# Patient Record
Sex: Female | Born: 1955 | Race: White | Hispanic: No | Marital: Married | Smoking: Former smoker
Health system: Southern US, Community
[De-identification: ages and names within clinical notes are randomized; demographics above are authoritative.]

## PROBLEM LIST (undated history)

## (undated) DIAGNOSIS — R232 Flushing: Secondary | ICD-10-CM

## (undated) DIAGNOSIS — D7289 Other specified disorders of white blood cells: Secondary | ICD-10-CM

## (undated) DIAGNOSIS — I10 Essential (primary) hypertension: Secondary | ICD-10-CM

## (undated) DIAGNOSIS — J309 Allergic rhinitis, unspecified: Secondary | ICD-10-CM

## (undated) HISTORY — DX: Flushing: R23.2

## (undated) HISTORY — DX: Allergic rhinitis, unspecified: J30.9

## (undated) HISTORY — PX: CARPAL TUNNEL RELEASE: SHX101

## (undated) HISTORY — DX: Other specified disorders of white blood cells: D72.89

## (undated) HISTORY — DX: Essential (primary) hypertension: I10

---

## 1997-11-29 ENCOUNTER — Other Ambulatory Visit: Admission: RE | Admit: 1997-11-29 | Discharge: 1997-11-29 | Payer: Self-pay | Admitting: Obstetrics and Gynecology

## 1999-01-08 ENCOUNTER — Other Ambulatory Visit: Admission: RE | Admit: 1999-01-08 | Discharge: 1999-01-08 | Payer: Self-pay | Admitting: Obstetrics and Gynecology

## 1999-01-22 ENCOUNTER — Encounter: Admission: RE | Admit: 1999-01-22 | Discharge: 1999-01-22 | Payer: Self-pay | Admitting: Obstetrics and Gynecology

## 1999-01-22 ENCOUNTER — Encounter: Payer: Self-pay | Admitting: Obstetrics and Gynecology

## 1999-02-04 ENCOUNTER — Encounter: Admission: RE | Admit: 1999-02-04 | Discharge: 1999-03-13 | Payer: Self-pay | Admitting: Internal Medicine

## 1999-09-24 ENCOUNTER — Ambulatory Visit (HOSPITAL_COMMUNITY): Admission: RE | Admit: 1999-09-24 | Discharge: 1999-09-24 | Payer: Self-pay | Admitting: Obstetrics and Gynecology

## 1999-09-24 ENCOUNTER — Encounter (INDEPENDENT_AMBULATORY_CARE_PROVIDER_SITE_OTHER): Payer: Self-pay

## 2000-01-21 ENCOUNTER — Other Ambulatory Visit: Admission: RE | Admit: 2000-01-21 | Discharge: 2000-01-21 | Payer: Self-pay | Admitting: Obstetrics and Gynecology

## 2000-01-26 ENCOUNTER — Encounter: Admission: RE | Admit: 2000-01-26 | Discharge: 2000-01-26 | Payer: Self-pay | Admitting: Obstetrics and Gynecology

## 2000-01-26 ENCOUNTER — Encounter: Payer: Self-pay | Admitting: Obstetrics and Gynecology

## 2001-02-15 ENCOUNTER — Encounter: Admission: RE | Admit: 2001-02-15 | Discharge: 2001-02-15 | Payer: Self-pay | Admitting: Obstetrics and Gynecology

## 2001-02-15 ENCOUNTER — Encounter: Payer: Self-pay | Admitting: Obstetrics and Gynecology

## 2001-03-09 ENCOUNTER — Other Ambulatory Visit: Admission: RE | Admit: 2001-03-09 | Discharge: 2001-03-09 | Payer: Self-pay | Admitting: Obstetrics and Gynecology

## 2002-03-15 ENCOUNTER — Encounter: Payer: Self-pay | Admitting: Obstetrics and Gynecology

## 2002-03-15 ENCOUNTER — Encounter: Admission: RE | Admit: 2002-03-15 | Discharge: 2002-03-15 | Payer: Self-pay | Admitting: Obstetrics and Gynecology

## 2002-04-10 ENCOUNTER — Other Ambulatory Visit: Admission: RE | Admit: 2002-04-10 | Discharge: 2002-04-10 | Payer: Self-pay | Admitting: Obstetrics and Gynecology

## 2003-04-24 ENCOUNTER — Encounter: Admission: RE | Admit: 2003-04-24 | Discharge: 2003-04-24 | Payer: Self-pay | Admitting: Obstetrics and Gynecology

## 2003-06-25 ENCOUNTER — Other Ambulatory Visit: Admission: RE | Admit: 2003-06-25 | Discharge: 2003-06-25 | Payer: Self-pay | Admitting: Obstetrics and Gynecology

## 2004-08-21 ENCOUNTER — Other Ambulatory Visit: Admission: RE | Admit: 2004-08-21 | Discharge: 2004-08-21 | Payer: Self-pay | Admitting: Obstetrics and Gynecology

## 2004-10-14 ENCOUNTER — Ambulatory Visit: Payer: Self-pay | Admitting: Gastroenterology

## 2005-10-16 ENCOUNTER — Ambulatory Visit: Payer: Self-pay | Admitting: Internal Medicine

## 2005-11-13 ENCOUNTER — Ambulatory Visit: Payer: Self-pay | Admitting: Internal Medicine

## 2005-11-13 DIAGNOSIS — I10 Essential (primary) hypertension: Secondary | ICD-10-CM

## 2006-02-22 ENCOUNTER — Ambulatory Visit: Payer: Self-pay | Admitting: Internal Medicine

## 2006-09-23 ENCOUNTER — Encounter: Admission: RE | Admit: 2006-09-23 | Discharge: 2006-09-23 | Payer: Self-pay | Admitting: Obstetrics and Gynecology

## 2006-09-29 ENCOUNTER — Ambulatory Visit: Payer: Self-pay | Admitting: Internal Medicine

## 2006-09-29 LAB — CONVERTED CEMR LAB
Bilirubin Urine: NEGATIVE
Blood in Urine, dipstick: NEGATIVE
Glucose, Urine, Semiquant: NEGATIVE
Ketones, urine, test strip: NEGATIVE
Nitrite: NEGATIVE
Protein, U semiquant: NEGATIVE
Specific Gravity, Urine: <1.005
Urobilinogen, UA: 0.2
pH: 5.5

## 2006-10-12 ENCOUNTER — Telehealth: Payer: Self-pay | Admitting: Internal Medicine

## 2006-10-13 ENCOUNTER — Ambulatory Visit: Payer: Self-pay | Admitting: Internal Medicine

## 2006-10-13 LAB — CONVERTED CEMR LAB
Blood in Urine, dipstick: NEGATIVE
Glucose, Urine, Semiquant: NEGATIVE
Nitrite: POSITIVE
Specific Gravity, Urine: 1.02
Urobilinogen, UA: 0.2
WBC Urine, dipstick: NEGATIVE
pH: 5

## 2007-03-10 ENCOUNTER — Ambulatory Visit: Payer: Self-pay | Admitting: Internal Medicine

## 2007-03-10 ENCOUNTER — Telehealth: Payer: Self-pay | Admitting: *Deleted

## 2007-04-13 ENCOUNTER — Encounter (INDEPENDENT_AMBULATORY_CARE_PROVIDER_SITE_OTHER): Payer: Self-pay | Admitting: Obstetrics and Gynecology

## 2007-04-13 ENCOUNTER — Ambulatory Visit (HOSPITAL_COMMUNITY): Admission: RE | Admit: 2007-04-13 | Discharge: 2007-04-13 | Payer: Self-pay | Admitting: Obstetrics and Gynecology

## 2007-04-28 ENCOUNTER — Ambulatory Visit: Payer: Self-pay | Admitting: Internal Medicine

## 2007-04-29 ENCOUNTER — Telehealth: Payer: Self-pay | Admitting: Internal Medicine

## 2007-05-03 ENCOUNTER — Telehealth: Payer: Self-pay | Admitting: Internal Medicine

## 2007-05-04 ENCOUNTER — Ambulatory Visit: Payer: Self-pay | Admitting: Internal Medicine

## 2007-05-04 DIAGNOSIS — D7289 Other specified disorders of white blood cells: Secondary | ICD-10-CM

## 2007-05-04 HISTORY — DX: Other specified disorders of white blood cells: D72.89

## 2007-05-04 LAB — CONVERTED CEMR LAB
ALT: 110 units/L — ABNORMAL HIGH (ref 0–35)
Alkaline Phosphatase: 134 units/L — ABNORMAL HIGH (ref 39–117)
Bilirubin, Direct: 0.1 mg/dL (ref 0.0–0.3)
CO2: 31 meq/L (ref 19–32)
Glucose, Bld: 88 mg/dL (ref 70–99)
Hemoglobin: 12.2 g/dL (ref 12.0–15.0)
Lymphocytes Relative: 58.4 % — ABNORMAL HIGH (ref 12.0–46.0)
Monocytes Relative: 12.4 % — ABNORMAL HIGH (ref 3.0–12.0)
Platelets: 154 10*3/uL (ref 150–400)
Potassium: 4.2 meq/L (ref 3.5–5.1)
RDW: 13.4 % (ref 11.5–14.6)
Sodium: 141 meq/L (ref 135–145)
Total Protein: 7 g/dL (ref 6.0–8.3)

## 2007-05-06 LAB — CONVERTED CEMR LAB
Basophils Absolute: 0 10*3/uL (ref 0.0–0.1)
Lymphocytes Relative: 59.1 % — ABNORMAL HIGH (ref 12.0–46.0)
MCHC: 33.7 g/dL (ref 30.0–36.0)
Neutro Abs: 1.6 10*3/uL (ref 1.4–7.7)
Neutrophils Relative %: 28.9 % — ABNORMAL LOW (ref 43.0–77.0)
RDW: 13.3 % (ref 11.5–14.6)

## 2007-05-09 ENCOUNTER — Encounter: Payer: Self-pay | Admitting: Internal Medicine

## 2007-05-10 ENCOUNTER — Telehealth: Payer: Self-pay | Admitting: Internal Medicine

## 2007-05-12 LAB — CONVERTED CEMR LAB
Ferritin: 136 ng/mL
HCT: 37.1 %
Hemoglobin: 12.4 g/dL
Iron: 81 ug/dL
LDH: 248 U/L
MCHC: 33.4 g/dL
MCV: 84.5 fL
Platelets: 185 10*3/uL
RBC: 4.39 M/uL
RDW: 15.1 %
Saturation Ratios: 25 %
TIBC: 321 ug/dL
UIBC: 240 ug/dL
Vitamin B-12: 430 pg/mL
WBC: 7.3 10*3/uL

## 2007-05-16 ENCOUNTER — Telehealth: Payer: Self-pay | Admitting: Internal Medicine

## 2007-05-16 ENCOUNTER — Ambulatory Visit: Payer: Self-pay | Admitting: Hematology and Oncology

## 2007-05-26 ENCOUNTER — Other Ambulatory Visit: Admission: RE | Admit: 2007-05-26 | Discharge: 2007-05-26 | Payer: Self-pay | Admitting: Hematology and Oncology

## 2007-05-26 ENCOUNTER — Telehealth: Payer: Self-pay | Admitting: Internal Medicine

## 2007-05-26 ENCOUNTER — Encounter: Payer: Self-pay | Admitting: Internal Medicine

## 2007-05-26 ENCOUNTER — Encounter: Payer: Self-pay | Admitting: Hematology and Oncology

## 2007-07-11 ENCOUNTER — Ambulatory Visit: Payer: Self-pay | Admitting: Internal Medicine

## 2007-07-12 ENCOUNTER — Encounter: Payer: Self-pay | Admitting: Internal Medicine

## 2007-08-23 ENCOUNTER — Telehealth: Payer: Self-pay | Admitting: Internal Medicine

## 2007-09-26 ENCOUNTER — Encounter: Admission: RE | Admit: 2007-09-26 | Discharge: 2007-09-26 | Payer: Self-pay | Admitting: Obstetrics and Gynecology

## 2008-02-03 ENCOUNTER — Telehealth: Payer: Self-pay | Admitting: Internal Medicine

## 2008-02-03 ENCOUNTER — Ambulatory Visit: Payer: Self-pay | Admitting: Internal Medicine

## 2008-02-03 DIAGNOSIS — L0292 Furuncle, unspecified: Secondary | ICD-10-CM | POA: Insufficient documentation

## 2008-02-03 DIAGNOSIS — L0293 Carbuncle, unspecified: Secondary | ICD-10-CM

## 2008-02-07 ENCOUNTER — Ambulatory Visit: Payer: Self-pay | Admitting: Internal Medicine

## 2008-02-10 ENCOUNTER — Ambulatory Visit: Payer: Self-pay | Admitting: Internal Medicine

## 2008-02-22 ENCOUNTER — Ambulatory Visit: Payer: Self-pay | Admitting: Internal Medicine

## 2008-04-17 ENCOUNTER — Ambulatory Visit: Payer: Self-pay | Admitting: Family Medicine

## 2008-04-20 ENCOUNTER — Ambulatory Visit: Payer: Self-pay | Admitting: Internal Medicine

## 2008-04-20 DIAGNOSIS — M25519 Pain in unspecified shoulder: Secondary | ICD-10-CM | POA: Insufficient documentation

## 2008-05-07 ENCOUNTER — Encounter: Payer: Self-pay | Admitting: Internal Medicine

## 2009-03-11 ENCOUNTER — Encounter: Payer: Self-pay | Admitting: Internal Medicine

## 2009-12-03 ENCOUNTER — Ambulatory Visit: Payer: Self-pay | Admitting: Internal Medicine

## 2009-12-03 DIAGNOSIS — J309 Allergic rhinitis, unspecified: Secondary | ICD-10-CM | POA: Insufficient documentation

## 2009-12-03 HISTORY — DX: Allergic rhinitis, unspecified: J30.9

## 2010-02-13 NOTE — Assessment & Plan Note (Signed)
Summary: cold symptoms/nasal drip/drainage in throat/cjr   Vital Signs:  Patient profile:   55 year old female Weight:      160 pounds Temp:     98.2 degrees F oral BP sitting:   112 / 70  (right arm) Cuff size:   regular  Vitals Entered By: Duard Brady LPN (December 03, 2009 10:28 AM) CC: c/o nasal congestion and draiage x87mos      **declines flu vaccune Is Patient Diabetic? No   CC:  c/o nasal congestion and draiage x46mos      **declines flu vaccune.  History of Present Illness: 55 year old patient who presents with a 4-week history of primarily rhinorrhea.  She has some cold symptoms earlier and now has persistent postnasal drip and cough.  Denies any sore throat, fever, or purulent sputum production.  Denies any chest pain or shortness of breath.  No real history of allergic rhinitis.  She does have some associated nasal congestion  Preventive Screening-Counseling & Management  Alcohol-Tobacco     Smoking Status: quit  Allergies (verified): No Known Drug Allergies  Past History:  Past Medical History: Reviewed history from 11/13/2005 and no changes required. Unremarkable  Review of Systems       The patient complains of prolonged cough.  The patient denies anorexia, fever, weight loss, weight gain, vision loss, decreased hearing, hoarseness, chest pain, syncope, dyspnea on exertion, peripheral edema, headaches, hemoptysis, abdominal pain, melena, hematochezia, severe indigestion/heartburn, hematuria, incontinence, genital sores, muscle weakness, suspicious skin lesions, transient blindness, difficulty walking, depression, unusual weight change, abnormal bleeding, enlarged lymph nodes, angioedema, and breast masses.    Physical Exam  General:  Well-developed,well-nourished,in no acute distress; alert,appropriate and cooperative throughout examination Head:  Normocephalic and atraumatic without obvious abnormalities. No apparent alopecia or balding. Eyes:  No  corneal or conjunctival inflammation noted. EOMI. Perrla. Funduscopic exam benign, without hemorrhages, exudates or papilledema. Vision grossly normal. Ears:  External ear exam shows no significant lesions or deformities.  Otoscopic examination reveals clear canals, tympanic membranes are intact bilaterally without bulging, retraction, inflammation or discharge. Hearing is grossly normal bilaterally. Mouth:  Oral mucosa and oropharynx without lesions or exudates.  Teeth in good repair. Neck:  No deformities, masses, or tenderness noted. Lungs:  Normal respiratory effort, chest expands symmetrically. Lungs are clear to auscultation, no crackles or wheezes. Heart:  Normal rate and regular rhythm. S1 and S2 normal without gallop, murmur, click, rub or other extra sounds. Abdomen:  Bowel sounds positive,abdomen soft and non-tender without masses, organomegaly or hernias noted.   Impression & Recommendations:  Problem # 1:  RHINITIS (ICD-477.9)  Her updated medication list for this problem includes:    Fluticasone Propionate 50 Mcg/act Susp (Fluticasone propionate) ..... Use nasally daily  Complete Medication List: 1)  Paroxetine Hcl 30 Mg Tabs (Paroxetine hcl) .... Take 1 tablet by mouth once a day 2)  Ambien 5 Mg Tabs (Zolpidem tartrate) .... Take 1 tablet by mouth at bedtime as needed 3)  Fluticasone Propionate 50 Mcg/act Susp (Fluticasone propionate) .... Use nasally daily  Patient Instructions: 1)  Get plenty of rest, drink lots of clear liquids 2)  Zutripro- one tsp every 6 hours 3)  zyrtec one daily  Prescriptions: FLUTICASONE PROPIONATE 50 MCG/ACT SUSP (FLUTICASONE PROPIONATE) use nasally daily  #1 x 3   Entered and Authorized by:   Gordy Savers  MD   Signed by:   Gordy Savers  MD on 12/03/2009   Method used:   Print  then Give to Patient   RxID:   [id number]    Orders Added: 1)  Est. Patient Level III [99213]  Appended Document: cold symptoms/nasal  drip/drainage in throat/cjr     Allergies: No Known Drug Allergies   Complete Medication List: 1)  Paroxetine Hcl 30 Mg Tabs (Paroxetine hcl) .... Take 1 tablet by mouth once a day 2)  Ambien 5 Mg Tabs (Zolpidem tartrate) .... Take 1 tablet by mouth at bedtime as needed 3)  Fluticasone Propionate 50 Mcg/act Susp (Fluticasone propionate) .... Use nasally daily  Other Orders: Depo- Medrol 80mg  (J1040) Admin of Therapeutic Inj  intramuscular or subcutaneous (56213)   Medication Administration  Injection # 1:    Medication: Depo- Medrol 80mg     Diagnosis: RHINITIS (ICD-477.9)    Route: IM    Site: R deltoid    Exp Date: 05/2012    Lot #: [id number]    Mfr: Pharmacia    Patient tolerated injection without complications    Given by: Duard Brady LPN (December 03, 2009 11:25 AM)  Orders Added: 1)  Depo- Medrol 80mg  [J1040] 2)  Admin of Therapeutic Inj  intramuscular or subcutaneous [[id number]]

## 2010-02-13 NOTE — Procedures (Signed)
Summary: Colonoscopy Report/[facility]  Colonoscopy Report/[facility]   Imported By: Maryln Gottron 03/25/2009 15:19:53  _____________________________________________________________________  External Attachment:    Type:   Image     Comment:   External Document

## 2010-02-13 NOTE — Letter (Signed)
Summary: [facility]  [facility]   Imported By: Maryln Gottron 03/25/2009 15:24:23  _____________________________________________________________________  External Attachment:    Type:   Image     Comment:   External Document

## 2010-05-21 ENCOUNTER — Encounter: Payer: Self-pay | Admitting: Family Medicine

## 2010-05-21 ENCOUNTER — Ambulatory Visit (INDEPENDENT_AMBULATORY_CARE_PROVIDER_SITE_OTHER): Payer: Commercial Managed Care - PPO | Admitting: Family Medicine

## 2010-05-21 DIAGNOSIS — J019 Acute sinusitis, unspecified: Secondary | ICD-10-CM

## 2010-05-21 MED ORDER — AMOXICILLIN 875 MG PO TABS: 875.0000 mg | ORAL_TABLET | Freq: Two times a day (BID) | ORAL | Status: AC

## 2010-05-21 NOTE — Patient Instructions (Signed)
Warm compresses to face several times daily. Follow up promptly for any fever or worsening symptoms.

## 2010-05-21 NOTE — Progress Notes (Signed)
  Subjective:    Patient ID: Dana Dillon, female    DOB: 26-Apr-1955, 55 y.o.   MRN: [id number]  HPI Patient seen with onset about 3-4 days ago some right maxillary facial swelling. She does have some swelling around the bridge of her nose,  right side. No injury. Nasal discharge mostly clear and slightly yellow tinged mucus. No bloody nasal discharge. Denies any fever or chills. Sense of smell is intact. Use some Claritin with some relief of her clear drainage. Denies headache.   Review of Systems  Constitutional: Negative for fever, chills and fatigue.  HENT: Negative for ear pain and sore throat.   Respiratory: Negative for cough.   Neurological: Negative for headaches.  Hematological: Negative for adenopathy.       Objective:   Physical Exam  Constitutional: She appears well-developed and well-nourished. No distress.  HENT:  Right Ear: External ear normal.  Left Ear: External ear normal.  Mouth/Throat: Oropharynx is clear and moist. No oropharyngeal exudate.       She has some subtle but minimal swelling right maxillary sinus region. No erythema or warmth. Minimally tender to palpation. Nasal exam unremarkable. No visible polyps. No visible purulent secretions  Cardiovascular: Normal rate, regular rhythm and normal heart sounds.   Pulmonary/Chest: Breath sounds normal. No respiratory distress. She has no wheezes. She has no rales.          Assessment & Plan:  Probable acute right maxillary sinusitis. Amoxicillin 875 mg twice daily for 10 days. Consider limited CT of sinuses if persists

## 2010-05-27 NOTE — H&P (Signed)
NAME:  QUIDA, GLASSER NO.:  [id number]   MEDICAL RECORD NO.:  [id number]          PATIENT TYPE:  AMB   LOCATION:  SDC                           FACILITY:  [facility]   PHYSICIAN:  Juluis Mire, M.D.   DATE OF BIRTH:  March 21, 1955   DATE OF ADMISSION:  04/13/2007  DATE OF DISCHARGE:                              HISTORY & PHYSICAL   HISTORY OF PRESENT ILLNESS:  The patient is 55 year old gravida 3, para  2 abortus one female who presents for hysteroscopy.  The patient had  been experiencing postmenopausal bleeding.  Due to uterine position and  patient discomfort, we are unable to evaluate her in the office.  We  were able to an ultrasound, but we really could not get a good view of  the intrauterine cavity to determine whether there was a polyp or not.  Some due to uterine position, but a lot due to patient discomfort.  In  view of this, she presents for hysteroscopy and D&C to rule out any type  of endometrial pathology.   ALLERGIES:  NO KNOWN DRUG ALLERGIES.   MEDICATIONS:  She is on sleep aids and herbal products.   PAST MEDICAL HISTORY:  Usual childhood diseases.  No significant  sequelae.   PAST SURGICAL HISTORY:  She had two cesarean section and D&C.   FAMILY HISTORY:  Noncontributory.   SOCIAL HISTORY:  Reveals no tobacco or alcohol use.   REVIEW OF SYSTEMS:  Noncontributory.   PHYSICAL EXAMINATION:  VITAL SIGNS:  Patient afebrile, stable vital  signs.  HEENT:  The patient is normocephalic.  Pupils equal, round and react to  light accommodation.  Extraocular movements were intact.  Sclerae and  conjunctivae are clear.  Oropharynx clear.  NECK:  Without thyromegaly.  BREASTS:  No discrete masses.  LUNGS:  Clear.  CARDIOVASCULAR:  Regular rate without murmurs or gallops.  ABDOMEN:  Exam is benign.  No mass, megaly or tenderness.  PELVIC:  Normal external genitalia.  Vaginal mucosa is clear.  Cervix  unremarkable.  Uterus normal size, shape and contour.   Adnexa free of  masses or tenderness.  EXTREMITIES:  Trace edema.  NEUROLOGICAL:  Grossly within normal limits.   IMPRESSION:  Postmenopausal bleeding with inability to determine the  endometrial status in the office.   PLAN:  The patient will undergo hysteroscopy, D&C.  The nature of the  procedure have been discussed.  The risks have been explained including  the risk of infection.  Risk of hemorrhage that could require  transfusion with the risk of AIDS or hepatitis.  Risk of injury to  adjacent organs including bladder, bowel, ureters that could require  further  exploratory surgery.  Risk of deep venous thrombosis and pulmonary  embolus.  Should she have significant bleeding or complications,  hysterectomy may be required.  The patient acknowledged understanding of  indications and risks.      Juluis Mire, M.D.  Electronically Signed     JSM/MEDQ  D:  04/13/2007  T:  04/13/2007  Job:  [id number]

## 2010-05-27 NOTE — Op Note (Signed)
NAME:  Dana Dillon, Dana Dillon NO.:  [id number]   MEDICAL RECORD NO.:  [id number]          PATIENT TYPE:  AMB   LOCATION:  SDC                           FACILITY:  [facility]   PHYSICIAN:  Juluis Mire, M.D.   DATE OF BIRTH:  [birth year]   DATE OF PROCEDURE:  04/13/2007  DATE OF DISCHARGE:                               OPERATIVE REPORT   POSTOPERATIVE DIAGNOSIS:  Postmenopausal bleeding.   PREOPERATIVE DIAGNOSIS:  Postmenopausal bleeding.   OPERATIVE PROCEDURE:  1. Cervical dilatation.  2. Hysteroscopy with endometrial biopsies.  3,  Endometrial curettings.   SURGEON:  Juluis Mire, M.D.   ANESTHESIA:  General.   ESTIMATED BLOOD LOSS:  Minimal.   PACKS AND DRAINS:  None.   INTRAOPERATIVE BLOOD REPLACEMENT:  None.   COMPLICATIONS:  None.   INDICATIONS:  Dictated in the history and physical.   PROCEDURE:  The patient was taken to the OR, placed in the supine  position.  After satisfactory level of general anesthesia was obtained,  the patient was placed in the dorsal position using the Allen stirrups.  At this point and time the perineum and vagina were cleansed out with  Betadine and draped in a sterile field.  A speculum was placed in the  vaginal vault.  The cervix was grasped with a single-tooth tenaculum.  The uterus was sounded to approximately 8 cm.  The cervix was serially  dilated to a size 37 Pratt dilator.  The operative hysteroscope was  introduced; the intrauterine cavity was distended using sorbitol.   Visualization revealed an extremely atrophic endometrium.  There were no  abnormalities.  No polyps or excrescences.  Multiple biopsies were  obtained.  After this, endometrial curettings were obtained.  There were  no signs of perforation or active bleeding.  Total deficit was 70 mL.   The speculum and single-tooth tenaculum were then removed.  The patient  was taken out of the dorsal lithotomy position.  Once alert and  extubated, she was  transferred to the recovery room in good condition.  Sponge, instrument and needle counts were reported correct by  circulating nurse.      Juluis Mire, M.D.  Electronically Signed     JSM/MEDQ  D:  04/13/2007  T:  04/13/2007  Job:  [id number]

## 2010-05-30 NOTE — H&P (Signed)
[facility] [place]  Patient:    Dana Dillon, Dana Dillon                        MRN: [id number] Adm. Date:  [id number] Attending:  Frederich Balding                         History and Physical  HISTORY:                      The patient is a 55 year old gravida 3, para 2, married white female, who presents for a D&E, for management of nonviable first trimester pregnancy.  The patient has been experiencing first trimester bleeding.  She has had a very slow rise in her hCG from 4692 to 5694 over a 48-hour period.  Subsequent ultrasound revealed a very irregular gestational sac.  No fetal pole.  Heart activity consistent with a nonviable first trimester pregnancy.  The patient is admitted to the present time to undergo a dilatation and evacuation for this.  ALLERGIES:                    No known drug allergies.  CURRENT MEDICATIONS:          None.  PAST MEDICAL HISTORY:         Usual childhood diseases, without significant sequela.  PAST SURGICAL HISTORY:        None.  Two prior cesarean sections.  SOCIAL HISTORY:               No tobacco use.  FAMILY HISTORY:               Noncontributory.  REVIEW OF SYSTEMS:            Noncontributory.  PHYSICAL EXAMINATION:  VITAL SIGNS:                  The patient is afebrile, with stable vital signs.  HEENT:                        The patient is normocephalic.  Pupils equal, round, reactive to light and accommodation.  Extraocular movements intact. Sclerae and conjunctivae clear.  Oropharynx clear.  NECK:                         Without thyromegaly.  BREASTS:                      Glandular, no discrete masses.  LUNGS:                        Clear.  CARDIAC:                      A regular rate.  No murmurs or gallops.  ABDOMEN:                      Examination benign.  PELVIC:                       Normal external genitalia.  Vaginal examination is clear.  Cervix unremarkable, moderate bleeding.  Uterus is eight  to nine weeks size.  Adnexa unremarkable.  EXTREMITIES:  Trace edema.  NEUROLOGIC:                   Grossly within normal limits.  IMPRESSION:                   Basic compressions, nonviable first trimester                               pregnancy.  PLAN:                         The patient will undergo a dilatation and evacuation.  The risks have been discussed, including the risks of infection, the risks of continued bleeding that could require a repeat D&C, excessive hemorrhage requiring a transfusion, with the risks of AIDS or hepatitis, the risks of uterine perforation that could lead to injury to the adjacent organs, requiring subsequent diagnostic laparoscopy and possible exploratory laparotomy.  I also discussed the risks of deep vein thrombosis and pulmonary embolus.  The patients blood type is AB-positive.  RhoGAM will not be required. DD:  09/24/99 TD:  09/24/99 Job: [id number]

## 2010-05-30 NOTE — Op Note (Signed)
[facility] [place]  Patient:    Dana Dillon, Dana Dillon                        MRN: [id number] Proc. Date: 09/24/99 Adm. Date:  [id number] Attending:  Frederich Balding                           Operative Report  PREOPERATIVE DIAGNOSIS:       Nonviable first trimester pregnancy.  POSTOPERATIVE DIAGNOSIS:      Nonviable first trimester pregnancy.  OPERATION:                    Dilatation and evacuation.  SURGEON:                      Juluis Mire, M.D.  ASSISTANT:  ANESTHESIA:                   Sedation with paracervical block.  ESTIMATED BLOOD LOSS:         Minimal.  PACKS AND DRAINS:             None.  INTRAOPERATIVE BLOOD:         None.  COMPLICATIONS:                None.  INDICATIONS:                  This is dictated in the history and physical.  DESCRIPTION OF PROCEDURE:     The patient was taken to the OR and placed in the supine position.  After sedation, the patient was placed in the dorsal lithotomy position using the Allen stirrups.  Speculum was placed in the vaginal vault.  The cervix was cleansed with Betadine.  A paracervical block was then achieved with 1% Xylocaine.  Cervix sounded to approximately 10 cm. Cervix was serially dilated to a size 29 Pratt dilator. A size 8 curved suction curette was introduced and contents were removed using suction curetting.  Sharp curetting revealed all quadrants to be clear.  Repeat suction curette revealed no additional tissue.  The uterus was contracting down well with no evidence of perforation.  Single-tooth tenaculum and speculum were then removed.  The patient was taken out of the dorsal lithotomy position and once alert and awake, was transferred to the recovery room in good condition.  Sponge, needle and instrument counts reported as correct by circulating nurse. DD:  09/24/99 TD:  09/25/99 Job: [id number]

## 2010-10-07 LAB — CBC
HCT: 36.7
MCHC: 34.9
Platelets: 155
RDW: 14.5

## 2010-11-24 ENCOUNTER — Other Ambulatory Visit: Payer: Self-pay | Admitting: Internal Medicine

## 2010-11-24 ENCOUNTER — Ambulatory Visit (INDEPENDENT_AMBULATORY_CARE_PROVIDER_SITE_OTHER): Payer: Commercial Managed Care - PPO | Admitting: Internal Medicine

## 2010-11-24 ENCOUNTER — Encounter: Payer: Self-pay | Admitting: Internal Medicine

## 2010-11-24 VITALS — BP 120/80 | HR 72 | Temp 98.7°F | Wt 166.0 lb

## 2010-11-24 DIAGNOSIS — R3989 Other symptoms and signs involving the genitourinary system: Secondary | ICD-10-CM

## 2010-11-24 DIAGNOSIS — N399 Disorder of urinary system, unspecified: Secondary | ICD-10-CM

## 2010-11-24 DIAGNOSIS — M545 Low back pain, unspecified: Secondary | ICD-10-CM | POA: Insufficient documentation

## 2010-11-24 LAB — POCT URINALYSIS DIPSTICK
Blood, UA: NEGATIVE
Glucose, UA: NEGATIVE
Nitrite, UA: NEGATIVE
Urobilinogen, UA: 1
pH, UA: 6.5

## 2010-11-24 NOTE — Progress Notes (Signed)
  Subjective:    Patient ID: Dana Dillon, female    DOB: 1955/09/30, 55 y.o.   MRN: [id number]  HPI Patient comes in today for SDA  For acute problem evaluation. Listed as uti however sx are New onset this am of discolored   Urine described as Red orange urine this am and then low back ache  rl local . She has had this back ache before and no gi sx . No dysuria bd pain or flank pain.  No hx of stones  Last uti a while back. Denies red pills coloring  Unsure if it was blood in urine. ? Red orange color.   Review of Systems No fever weight loss hx of hematuria Past history family history social history reviewed in the electronic medical record.     Objective:   Physical Exam  WDWN in nad Neck: Supple without adenopathy or masses or bruits Abdomen:  Sof,t normal bowel sounds without hepatosplenomegaly, no guarding rebound or masses no CVA tenderness Back no  Mid tender  right si area tenderness mild gait nl neg slr . UA clear  Today      Assessment & Plan:  Abnormal urine color ? If blood or not  Nl now and back pain seems ms and not alarming   follow closely and urine cup given  To return if reoccurs and fu with her pcp .

## 2010-11-24 NOTE — Patient Instructions (Signed)
Unsure if this was blood but if happens again or any new sx then bring in urine specimen and follow up.

## 2011-01-05 ENCOUNTER — Ambulatory Visit (INDEPENDENT_AMBULATORY_CARE_PROVIDER_SITE_OTHER): Payer: Commercial Managed Care - PPO | Admitting: Family Medicine

## 2011-01-05 ENCOUNTER — Encounter: Payer: Self-pay | Admitting: Family Medicine

## 2011-01-05 DIAGNOSIS — J309 Allergic rhinitis, unspecified: Secondary | ICD-10-CM

## 2011-01-05 NOTE — Progress Notes (Signed)
  Subjective:    Patient ID: Dana Dillon, female    DOB: 1955-04-12, 55 y.o.   MRN: [id number]  HPI 55 year old white female, former smoker, patient of Dr. Cato Mulligan is an with sore throat postnasal drip, watery, itchy has, stuffy head and one on for one week. She's been taking Claritin that does help some. Today she started taking Mucinex D and sore throat lozenges.   Review of Systems  Constitutional: Negative.   HENT: Positive for congestion, sneezing and postnasal drip.   Eyes: Positive for itching.  Respiratory: Negative.   Cardiovascular: Negative.   Skin: Negative.   Neurological: Negative.   Hematological: Negative.    Past Medical History  Diagnosis Date  . LYMPHOPENIA 05/04/2007  . RHINITIS 12/03/2009  . FURUNCLE, RECURRENT 02/03/2008  . SHOULDER PAIN, LEFT 04/20/2008  . ELEVATED BLOOD PRESSURE WITHOUT DIAGNOSIS OF HYPERTENSION 11/13/2005    History   Social History  . Marital Status: Married    Spouse Name: N/A    Number of Children: N/A  . Years of Education: N/A   Occupational History  . Not on file.   Social History Main Topics  . Smoking status: Former Smoker -- 1.0 packs/day for 15 years    Types: Cigarettes    Quit date: 05/20/1993  . Smokeless tobacco: Not on file  . Alcohol Use: Not on file  . Drug Use: Not on file  . Sexually Active: Not on file   Other Topics Concern  . Not on file   Social History Narrative  . No narrative on file    No past surgical history on file.  Family History  Problem Relation Age of Onset  . Hypertension Mother   . Cancer Sister     breast    No Known Allergies  Current Outpatient Prescriptions on File Prior to Visit  Medication Sig Dispense Refill  . zolpidem (AMBIEN) 5 MG tablet Take 5 mg by mouth at bedtime as needed.          BP 128/82  Pulse 64  Temp(Src) 98.5 F (36.9 C) (Oral)  Wt 164 lb (74.39 kg)   Objective:   Physical Exam  Constitutional: She is oriented to person, place, and time. She  appears well-developed and well-nourished.  HENT:  Right Ear: External ear normal.  Left Ear: External ear normal.  Nose: Nose normal.  Mouth/Throat: Oropharynx is clear and moist.  Eyes: Conjunctivae are normal.  Neck: Normal range of motion. Neck supple.  Cardiovascular: Normal rate, regular rhythm and normal heart sounds.   Pulmonary/Chest: Effort normal and breath sounds normal.  Neurological: She is alert and oriented to person, place, and time.  Skin: Skin is dry.  Psychiatric: She has a normal mood and affect.          Assessment & Plan:  Assessment: Allergic rhinitis  Plan: Over-the-counter symptomatic treatment for relief. Continue Claritin and Mucinex D. Call if symptoms worsen or persist recheck as scheduled, and when necessary.

## 2011-01-07 ENCOUNTER — Telehealth: Payer: Self-pay | Admitting: Family Medicine

## 2011-01-07 MED ORDER — LEVOFLOXACIN 750 MG PO TABS: 750.0000 mg | ORAL_TABLET | Freq: Every day | ORAL | Status: AC

## 2011-01-07 NOTE — Telephone Encounter (Signed)
Pt aware abx sent to pharmacy and advised to call back if sxs persist or worsen

## 2011-01-07 NOTE — Telephone Encounter (Signed)
Wife is a [facility] pt but saw PC on 12/24. Was told, according to pt, that she had allergies. Now has a 102 temp, neck pain, teeth hurt, thorat sore, body aches & chills. Husband said she cannot get out of bed to come in. Wants a Rx called in to [facility]. Please call ASAP

## 2011-01-09 ENCOUNTER — Telehealth: Payer: Self-pay

## 2011-01-09 NOTE — Telephone Encounter (Signed)
Pt saw the NP on 01/05/11. Pt has been taking levaquin, Mucinex, and Claritin.  Pt states she has not seen any relief as of yet. Pt's fever has broken but pt still has head congestion.    Called pt and advised to finish antibiotic and call back at the beginning of the week if symptoms worsen.

## 2011-08-11 ENCOUNTER — Encounter: Payer: Self-pay | Admitting: Family Medicine

## 2011-08-11 ENCOUNTER — Ambulatory Visit (INDEPENDENT_AMBULATORY_CARE_PROVIDER_SITE_OTHER): Payer: Commercial Managed Care - PPO | Admitting: Family Medicine

## 2011-08-11 VITALS — BP 132/86 | HR 70 | Temp 98.5°F | Wt 158.0 lb

## 2011-08-11 DIAGNOSIS — M79673 Pain in unspecified foot: Secondary | ICD-10-CM

## 2011-08-11 DIAGNOSIS — M79609 Pain in unspecified limb: Secondary | ICD-10-CM

## 2011-08-11 MED ORDER — DICLOFENAC SODIUM 75 MG PO TBEC: 75.0000 mg | DELAYED_RELEASE_TABLET | Freq: Two times a day (BID) | ORAL | Status: DC

## 2011-08-11 NOTE — Progress Notes (Signed)
  Subjective:    Patient ID: Dana Dillon, female    DOB: [birth year], 56 y.o.   MRN: [id number]  HPI Here for one month of pain in the bottoms of both feet. She is on her feet all day at work. She usually wears shoes with good arch supports but just before this pain started she switched to wearing sandals. Now she is back to good shoes but the pain will not go away. Using Motrin with little effect. The pain is worse on the left foot and it radiates up the medial side of the foot and lower leg.    Review of Systems  Constitutional: Negative.   Musculoskeletal: Positive for arthralgias.       Objective:   Physical Exam  Constitutional: She appears well-developed and well-nourished.  Musculoskeletal:       tedner along the medial arch on the left foot and into the medial side of the foot inferior to the tibia. Full ROM. Not warm or swollen or red           Assessment & Plan:  Probably from flat arches. Wear supportive shoes and try Diclofenac. She already has an appt with [facility] in 2 weeks

## 2012-01-13 LAB — LIPID PANEL
HDL: 63 mg/dL (ref 35–70)
LDL Cholesterol: 143 mg/dL

## 2012-01-15 ENCOUNTER — Encounter: Payer: Self-pay | Admitting: Family Medicine

## 2012-01-15 ENCOUNTER — Ambulatory Visit (INDEPENDENT_AMBULATORY_CARE_PROVIDER_SITE_OTHER): Payer: Commercial Managed Care - PPO | Admitting: Family Medicine

## 2012-01-15 VITALS — BP 122/82 | HR 96 | Temp 97.5°F | Wt 157.0 lb

## 2012-01-15 DIAGNOSIS — J329 Chronic sinusitis, unspecified: Secondary | ICD-10-CM

## 2012-01-15 DIAGNOSIS — J309 Allergic rhinitis, unspecified: Secondary | ICD-10-CM

## 2012-01-15 MED ORDER — AMOXICILLIN 875 MG PO TABS: 875.0000 mg | ORAL_TABLET | Freq: Two times a day (BID) | ORAL | Status: DC

## 2012-01-15 MED ORDER — FLUTICASONE PROPIONATE 50 MCG/ACT NA SUSP: 2.0000 | Freq: Every day | NASAL | Status: DC

## 2012-01-15 NOTE — Patient Instructions (Addendum)
INSTRUCTIONS FOR UPPER RESPIRATORY INFECTION:  -plenty of rest and fluids  -start antibiotic if worsening or not improving in next 3 days, shred antibiotic prescription if you do not use it -As we discussed, we have prescribed new medications(FLONASE NASAL SPRAY and AMOXICILLIN ANTIBIOTIC) for you at this appointment. We discussed the common and serious potential adverse effects of this medication and you can review these and more with the pharmacist when you pick up your medication.  Please follow the instructions for use carefully and notify us immediately if you have any problems taking this medication.  -nasal saline wash 2-3 times daily (use prepackaged nasal saline or bottled/distilled water if making your own)   -can use sinex nasal spray for drainage and nasal congestion - but do NOT use longer then 3-4 days  -can use tylenol or ibuprofen as directed for aches and sorethroat  -in the winter time, using a humidifier at night is helpful (please follow cleaning instructions)  -if you are taking a cough medication - use only as directed, may also try a teaspoon of honey to coat the throat and throat lozenges  -for sore throat, salt water gargles can help  -follow up if you have fevers, facial pain, tooth pain, difficulty breathing or are worsening or not getting better in 5-7 days  FOR YOUR ALLERGIES: -for rare symptoms use a daily over the counter medication such as claritin or zyrtec and your flonase -if more frequent or persistent attacks see your doctor

## 2012-01-15 NOTE — Progress Notes (Signed)
Chief Complaint  Patient presents with  . Sinusitis    facial pain; headache, tooth pain     HPI:  ?SInusitis: -started: 1 week ago -symptoms:started with sneezing, watery eyes, then today much more congested with tooth and maxillary pain on L side, nasal congestion, sore throat -denies:fever, SOB, cough, NVD, no flu or strep exposure -has tried: claritin, musinex, zytec last night -sick contacts: daughter with mono -Hx of: AR  AR: -take claritin from time to time -worse in the winter -sneezing and watery itchy eye and scratchy throat when she get these flares   ROS: See pertinent positives and negatives per HPI.  Past Medical History  Diagnosis Date  . LYMPHOPENIA 05/04/2007  . RHINITIS 12/03/2009  . FURUNCLE, RECURRENT 02/03/2008  . SHOULDER PAIN, LEFT 04/20/2008  . ELEVATED BLOOD PRESSURE WITHOUT DIAGNOSIS OF HYPERTENSION 11/13/2005    Family History  Problem Relation Age of Onset  . Hypertension Mother   . Cancer Sister     breast    History   Social History  . Marital Status: Married    Spouse Name: N/A    Number of Children: N/A  . Years of Education: N/A   Social History Main Topics  . Smoking status: Former Smoker -- 1.0 packs/day for 15 years    Types: Cigarettes    Quit date: 05/20/1993  . Smokeless tobacco: Never Used  . Alcohol Use: 3.5 oz/week    7 drink(s) per week  . Drug Use: No  . Sexually Active: None   Other Topics Concern  . None   Social History Narrative  . None    Current outpatient prescriptions:zolpidem (AMBIEN) 5 MG tablet, Take 5 mg by mouth at bedtime as needed.  , Disp: , Rfl: ;  amoxicillin (AMOXIL) 875 MG tablet, Take 1 tablet (875 mg total) by mouth 2 (two) times daily., Disp: 20 tablet, Rfl: 0;  diclofenac (VOLTAREN) 75 MG EC tablet, Take 1 tablet (75 mg total) by mouth 2 (two) times daily., Disp: 60 tablet, Rfl: 0;  escitalopram (LEXAPRO) 10 MG tablet, Take 10 mg by mouth daily.  , Disp: , Rfl:  fluticasone (FLONASE) 50  MCG/ACT nasal spray, Place 2 sprays into the nose daily., Disp: 16 g, Rfl: 6  EXAM:  Filed Vitals:   01/15/12 1413  BP: 122/82  Pulse: 96  Temp: 97.5 F (36.4 C)    There is no height on file to calculate BMI.  GENERAL: vitals reviewed and listed above, alert, oriented, appears well hydrated and in no acute distress  HEENT: atraumatic, conjunttiva clear, no obvious abnormalities on inspection of external nose and ears, normal appearance of ear canals and TMs, clear nasal congestion, mild post oropharyngeal erythema with PND, no tonsillar edema or exudate, no sinus TTP  NECK: no obvious masses on inspection  LUNGS: clear to auscultation bilaterally, no wheezes, rales or rhonchi, good air movement  CV: HRRR, no peripheral edema  MS: moves all extremities without noticeable abnormality  PSYCH: pleasant and cooperative, no obvious depression or anxiety  ASSESSMENT AND PLAN:  Discussed the following assessment and plan:  1. Sinusitis  amoxicillin (AMOXIL) 875 MG tablet  2. 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 Advised intermittent tx for her allergies with claritin or zyrtec daily during this season along with nasal steroid. Discussed may  possible have bacterial sinus infection given pain and discussed risks/benefits of abx. Pt would like to hold off on this, but has on hand incase worsens or persists. -Patient advised to return or notify a doctor immediately if symptoms worsen or persist or new concerns arise.  Patient Instructions  INSTRUCTIONS FOR UPPER RESPIRATORY INFECTION:  -plenty of rest and fluids  -start antibiotic if worsening or not improving in next 3 days, shred antibiotic prescription if you do not use it -As we discussed, we have prescribed new  medications(FLONASE NASAL SPRAY and AMOXICILLIN ANTIBIOTIC) for you at this appointment. We discussed the common and serious potential adverse effects of this medication and you can review these and more with the pharmacist when you pick up your medication.  Please follow the instructions for use carefully and notify us immediately if you have any problems taking this medication.  -nasal saline wash 2-3 times daily (use prepackaged nasal saline or bottled/distilled water if making your own)   -can use sinex nasal spray for drainage and nasal congestion - but do NOT use longer then 3-4 days  -can use tylenol or ibuprofen as directed for aches and sorethroat  -in the winter time, using a humidifier at night is helpful (please follow cleaning instructions)  -if you are taking a cough medication - use only as directed, may also try a teaspoon of honey to coat the throat and throat lozenges  -for sore throat, salt water gargles can help  -follow up if you have fevers, facial pain, tooth pain, difficulty breathing or are worsening or not getting better in 5-7 days  FOR YOUR ALLERGIES: -for rare symptoms use a daily over the counter medication such as claritin or zyrtec and your flonase -if more frequent or persistent attacks see your doctor      Terressa Koyanagi.

## 2012-02-27 ENCOUNTER — Other Ambulatory Visit: Payer: Self-pay

## 2012-04-06 ENCOUNTER — Ambulatory Visit (INDEPENDENT_AMBULATORY_CARE_PROVIDER_SITE_OTHER): Payer: Commercial Managed Care - PPO | Admitting: Internal Medicine

## 2012-04-06 ENCOUNTER — Encounter: Payer: Self-pay | Admitting: Internal Medicine

## 2012-04-06 VITALS — BP 140/98 | HR 80 | Temp 98.7°F | Wt 169.0 lb

## 2012-04-06 DIAGNOSIS — I10 Essential (primary) hypertension: Secondary | ICD-10-CM

## 2012-04-06 MED ORDER — LISINOPRIL 20 MG PO TABS: 20.0000 mg | ORAL_TABLET | Freq: Every day | ORAL | Status: DC

## 2012-04-06 NOTE — Progress Notes (Signed)
Patient ID: Dana Dillon, female   DOB: 09-24-1955, 57 y.o.   MRN: [id number] Had gyn appt- bps 150-170/100  Home bps 135-167/75-94  She otherwise feels well but is having a "broken foot" treated  Reviewed past medical history, family history, social history.  Review systems she denies any chest pain, shortness breath, PND, orthopnea.  Examination: Reviewed vital signs. Chest her auscultation cardiac exam S1-S2 are regular. Abdominal exam at a sounds, soft. Extremities no edema.

## 2012-04-06 NOTE — Assessment & Plan Note (Signed)
Needs treatment  Discussed need for weight loss, diet, exercise  Needs treatment

## 2012-05-04 ENCOUNTER — Encounter: Payer: Self-pay | Admitting: Family Medicine

## 2012-05-04 ENCOUNTER — Ambulatory Visit (INDEPENDENT_AMBULATORY_CARE_PROVIDER_SITE_OTHER): Payer: Commercial Managed Care - PPO | Admitting: Family Medicine

## 2012-05-04 ENCOUNTER — Telehealth: Payer: Self-pay | Admitting: Internal Medicine

## 2012-05-04 VITALS — BP 120/88 | Temp 98.0°F | Wt 158.0 lb

## 2012-05-04 DIAGNOSIS — R259 Unspecified abnormal involuntary movements: Secondary | ICD-10-CM

## 2012-05-04 DIAGNOSIS — R253 Fasciculation: Secondary | ICD-10-CM

## 2012-05-04 DIAGNOSIS — R05 Cough: Secondary | ICD-10-CM

## 2012-05-04 DIAGNOSIS — I10 Essential (primary) hypertension: Secondary | ICD-10-CM

## 2012-05-04 DIAGNOSIS — R197 Diarrhea, unspecified: Secondary | ICD-10-CM

## 2012-05-04 MED ORDER — LOSARTAN POTASSIUM 100 MG PO TABS: 100.0000 mg | ORAL_TABLET | Freq: Every day | ORAL | Status: DC

## 2012-05-04 NOTE — Telephone Encounter (Signed)
Patient calling about new symptoms since being on Lisinopril since 04/06/2012.  Has had eposides of constipation and after no BM for a week, took Senokot then had diarrhea.  Has not been able to have BM without laxative since starting Rx.  More concern to patient is loss of energy, legs feeling heavy, twitch at mouth followed by twitches below both eyes frequently since starting Rx.  Got call from pharmacy today that Lisinopril needs to be renewed but concern about symptoms is keeping her from getting refill until symptms addressed. Triaged in Eye Other Problems Guideline - Disposition:  See Provider within 72 hours due to repetitive uncontrolled twitching or swasms of the eyelid lasting more than 3 days and not previously evaluated.  Appt today 2:45 pm with Dr Cato Mulligan.

## 2012-05-04 NOTE — Telephone Encounter (Signed)
appt is with Dr Caryl Never

## 2012-05-04 NOTE — Progress Notes (Signed)
  Subjective:    Patient ID: Dana Dillon, female    DOB: 06-07-1955, 57 y.o.   MRN: [id number]  HPI  Patient recently started on lisinopril. She developed cough but this only lasted about one week. She also describes what sounds like some benign fasciculations involving muscles of the face. These are more of a nuisance than anything. No recent weakness. She does describe some intermittent constipation and diarrhea since starting lisinopril. She is convinced that diarrhea may be related. She had colonoscopy 2011. No bloody stools. No abdominal pain. No fevers or chills. No appetite or weight change.  Past Medical History  Diagnosis Date  . LYMPHOPENIA 05/04/2007  . RHINITIS 12/03/2009  . FURUNCLE, RECURRENT 02/03/2008  . SHOULDER PAIN, LEFT 04/20/2008  . ELEVATED BLOOD PRESSURE WITHOUT DIAGNOSIS OF HYPERTENSION 11/13/2005   No past surgical history on file.  reports that she quit smoking about 18 years ago. Her smoking use included Cigarettes. She has a 15 pack-year smoking history. She has never used smokeless tobacco. She reports that she drinks about 3.5 ounces of alcohol per week. She reports that she does not use illicit drugs. family history includes Cancer in her sister and Hypertension in her mother. No Known Allergies   Review of Systems  Constitutional: Negative for fever, chills, appetite change and unexpected weight change.  Respiratory: Negative for shortness of breath and wheezing.   Cardiovascular: Negative for chest pain.  Gastrointestinal: Positive for diarrhea. Negative for nausea, vomiting and abdominal pain.  Neurological: Negative for weakness.       Objective:   Physical Exam  Constitutional: She is oriented to person, place, and time. She appears well-developed and well-nourished.  Neck: Neck supple. No thyromegaly present.  Cardiovascular: Normal rate and regular rhythm.   Pulmonary/Chest: Effort normal and breath sounds normal. No respiratory distress. She  has no wheezes. She has no rales.  Musculoskeletal: She exhibits no edema.  Neurological: She is alert and oriented to person, place, and time. No cranial nerve deficit.          Assessment & Plan:  #1 hypertension. Improved. #2 probable benign fasciculations involving face. Reassurance. We reassured her these are not likely related to lisinopril in any way #3 recent cough probably not related to lisinopril since her cough only lasted for one week #4 persistent-somewhat intermittent diarrhea since starting lisinopril. We explained this is a possible side effect from lisinopril but not common. We agreed to switch to losartan 100 mg once daily and she has followup with primary in June to reassess

## 2012-05-04 NOTE — Telephone Encounter (Signed)
FYI

## 2012-05-04 NOTE — Patient Instructions (Addendum)
Your facial symptoms sound like benign fasciculations.

## 2012-05-06 ENCOUNTER — Encounter: Payer: Commercial Managed Care - PPO | Admitting: Internal Medicine

## 2012-05-13 ENCOUNTER — Encounter: Payer: Self-pay | Admitting: Family Medicine

## 2012-05-13 ENCOUNTER — Ambulatory Visit (INDEPENDENT_AMBULATORY_CARE_PROVIDER_SITE_OTHER): Payer: Commercial Managed Care - PPO | Admitting: Family Medicine

## 2012-05-13 VITALS — BP 110/80 | Temp 98.2°F | Wt 158.0 lb

## 2012-05-13 DIAGNOSIS — J029 Acute pharyngitis, unspecified: Secondary | ICD-10-CM

## 2012-05-13 DIAGNOSIS — J329 Chronic sinusitis, unspecified: Secondary | ICD-10-CM

## 2012-05-13 LAB — POCT RAPID STREP A (OFFICE): Rapid Strep A Screen: NEGATIVE

## 2012-05-13 NOTE — Patient Instructions (Signed)
-  use your flonase and take claritin or zyrtec daily  -plenty of rest and fluids  -nasal saline wash 2-3 times daily (use prepackaged nasal saline or bottled/distilled water if making your own)   -can use sinex nasal spray for drainage and nasal congestion - but do NOT use longer then 3-4 days  -can use tylenol or ibuprofen as directed for aches and sorethroat  -in the winter time, using a humidifier at night is helpful (please follow cleaning instructions)  -if you are taking a cough medication - use only as directed, may also try a teaspoon of honey to coat the throat and throat lozenges  -for sore throat, salt water gargles can help  -follow up if you have fevers, facial pain, tooth pain, difficulty breathing or are worsening or not getting better

## 2012-05-13 NOTE — Progress Notes (Signed)
Chief Complaint  Patient presents with  . Sore Throat    started on Wednesday, dry cough, body ahces, chills maybe fever, slight head congestion, drainage     HPI:  Acute visit for sore throat: -started 2 days ago -symptoms: sore throat - very sore, hoarseness, congestion, PND, cough, body aches, chills a few days ago -denies: fevers, SOB, tooth pain, facial pain, ear pain, strep exposure -has tried: musinex, motrin -hx of sinusitis and allergies ROS: See pertinent positives and negatives per HPI.  Past Medical History  Diagnosis Date  . LYMPHOPENIA 05/04/2007  . RHINITIS 12/03/2009  . FURUNCLE, RECURRENT 02/03/2008  . SHOULDER PAIN, LEFT 04/20/2008  . ELEVATED BLOOD PRESSURE WITHOUT DIAGNOSIS OF HYPERTENSION 11/13/2005    Family History  Problem Relation Age of Onset  . Hypertension Mother   . Cancer Sister     breast    History   Social History  . Marital Status: Married    Spouse Name: N/A    Number of Children: N/A  . Years of Education: N/A   Social History Main Topics  . Smoking status: Former Smoker -- 1.00 packs/day for 15 years    Types: Cigarettes    Quit date: 05/20/1993  . Smokeless tobacco: Never Used  . Alcohol Use: 3.5 oz/week    7 drink(s) per week  . Drug Use: No  . Sexually Active: None   Other Topics Concern  . None   Social History Narrative  . None    Current outpatient prescriptions:fluticasone (FLONASE) 50 MCG/ACT nasal spray, Place 2 sprays into the nose daily., Disp: 16 g, Rfl: 6;  losartan (COZAAR) 100 MG tablet, Take 1 tablet (100 mg total) by mouth daily., Disp: 30 tablet, Rfl: 5;  venlafaxine XR (EFFEXOR-XR) 75 MG 24 hr capsule, , Disp: , Rfl: ;  zolpidem (AMBIEN) 5 MG tablet, Take 5 mg by mouth at bedtime as needed.  , Disp: , Rfl:   EXAM:  Filed Vitals:   05/13/12 1001  BP: 110/80  Temp: 98.2 F (36.8 C)    Body mass index is 29.87 kg/(m^2).  GENERAL: vitals reviewed and listed above, alert, oriented, appears well  hydrated and in no acute distress  HEENT: atraumatic, conjunttiva clear, no obvious abnormalities on inspection of external nose and ears, normal appearance of ear canals and TMs, clear nasal congestion, boggy turbinates, mild post oropharyngeal erythema with PND, 1+ tonsillar edema with mild erythema, no sinus TTP  NECK: no obvious masses on inspection  LUNGS: clear to auscultation bilaterally, no wheezes, rales or rhonchi, good air movement  CV: HRRR, no peripheral edema  MS: moves all extremities without noticeable abnormality  PSYCH: pleasant and cooperative, no obvious depression or anxiety  ASSESSMENT AND PLAN:  Discussed the following assessment and plan:  Sore throat  Rhinosinusitis  -most likely combination of allergies and VURI. Rapid strep done and neg per pt concern though hx and exam not c/w strep. REcs per below and return precautions. -Patient advised to return or notify a doctor immediately if symptoms worsen or persist or new concerns arise.  There are no Patient Instructions on file for this visit.   Kriste Basque R.

## 2012-05-16 ENCOUNTER — Ambulatory Visit: Payer: Commercial Managed Care - PPO | Admitting: Internal Medicine

## 2012-05-20 ENCOUNTER — Telehealth: Payer: Self-pay | Admitting: Internal Medicine

## 2012-05-20 NOTE — Telephone Encounter (Signed)
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  Pharmacy- [facility].   PLEASE CONTACT PATIENT .  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  Pharmacy- [facility].   PLEASE CONTACT PATIENT .  Symptoms  Reason For Call & Symptoms: Patient seen in office 05/13/12 by Dr. Selena Batten for sore throat and Rhinosinusitis. Treated at home and followed instructions. She is not any better. Sore throat is slightly decreased. +mucus cough and draining clear.  "swallowing so much gook" . >10 days.  Reviewed Health History In EMR: Yes  Reviewed Medications In EMR: Yes  Reviewed Allergies In EMR: Yes  Reviewed Surgeries / Procedures: Yes  Date of Onset of Symptoms: 05/13/2012  Treatments Tried: Flonase, clairtin, claritin D, Mucinex D, Afrin and Motrin, lozenges  Treatments Tried Worked: No  Guideline(s) Used:  Sinus Pain and Congestion  Disposition Per Guideline:   See Today or Tomorrow in Office  Reason For Disposition Reached:   Nasal discharge present > 10 days  Advice Given:  For a Stuffy Nose - Use Nasal Washes:  Introduction: Saline (salt water) nasal irrigation (nasal wash) is an effective and simple home remedy for treating stuffy nose and sinus congestion. The nose can be irrigated by pouring, spraying, or squirting salt water into the nose and then letting it run back out.  How it Helps: The salt water rinses out excess mucus, washes out any irritants (dust, allergens) that might be present, and moistens the nasal cavity.  Methods: There are several ways to perform nasal irrigation. You can use a saline nasal spray bottle (available  over-the-counter), a rubber ear syringe, a medical syringe without the needle, or a Neti Pot.  For a Stuffy Nose - Use Nasal Washes:  Introduction: Saline (salt water) nasal irrigation (nasal wash) is an effective and simple home remedy for treating stuffy nose and sinus congestion. The nose can be irrigated by pouring, spraying, or squirting salt water into the nose and then letting it run back out.  How it Helps: The salt water rinses out excess mucus, washes out any irritants (dust, allergens) that might be present, and moistens the nasal cavity.  Methods: There are several ways to perform nasal irrigation. You can use a saline nasal spray bottle (available over-the-counter), a rubber ear syringe, a medical syringe without the needle, or a Neti Pot.  Step-By-Step Instructions:   Step 1: Lean over a sink.  Step 2: Gently squirt or spray warm salt water into one of your nostrils.  Step 3: Some of the water may run into the back of your throat. Spit this out. If you swallow the salt water it will not hurt you.  Step 4: Blow your nose to clean out the water and mucus.  Step 5: Repeat steps 1-4 for the other nostril. You can do this a couple times a day if it seems to help you.  Call Back If:   Fever lasts longer than 3 days  You become worse.  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  [facility]acy- [facility].   PLEASE CONTACT PATIENT .

## 2012-05-22 NOTE — Telephone Encounter (Signed)
Call patient Ok to try ABX Doxycycline 100 mg po bid for 10 days

## 2012-05-23 ENCOUNTER — Telehealth: Payer: Self-pay | Admitting: Internal Medicine

## 2012-05-23 MED ORDER — DOXYCYCLINE HYCLATE 100 MG PO TABS: 100.0000 mg | ORAL_TABLET | Freq: Two times a day (BID) | ORAL | Status: DC

## 2012-05-23 NOTE — Telephone Encounter (Signed)
rx sent in electronically, pt aware 

## 2012-05-23 NOTE — Telephone Encounter (Signed)
[facility] Triage Call Report Triage Record Num: [id number] Operator: Claudie Leach Patient Name: Vincent Ehrler Call Date & Time: 05/20/2012 9:23:12PM Patient Phone: PCP: Patient Gender: Female PCP Fax : Patient DOB: 01-14-1955 Practice Name: [facility] Reason for Call: Caller: [facility]; PCP: Birdie Sons (Adults only); CB#: [phone]; Call regarding Medication Issue; Medication(s): pharmacist calling to see if order for new prescription in chart; No new order noted. Caller made aware. Note sent to the office. Protocol(s) Used: Office Note Recommended Outcome per Protocol: Information Noted and Sent to Office Reason for Outcome: Caller information to office Care Advice: ~ 05/09/

## 2012-05-24 NOTE — Telephone Encounter (Signed)
This was on the doxycycline.  According to [facility] this has already been taken care of

## 2012-05-30 ENCOUNTER — Other Ambulatory Visit: Payer: Self-pay | Admitting: *Deleted

## 2012-05-30 MED ORDER — LOSARTAN POTASSIUM 100 MG PO TABS: 100.0000 mg | ORAL_TABLET | Freq: Every day | ORAL | Status: DC

## 2012-06-10 ENCOUNTER — Telehealth: Payer: Self-pay | Admitting: Internal Medicine

## 2012-06-10 NOTE — Telephone Encounter (Signed)
Patient Information:  Caller Name: Dana Dillon  Phone: [phone]  Patient: Dana Dillon  Gender: Female  DOB: 1955-01-27  Age: 57 Years  PCP: Birdie Sons (Adults only)  Office Follow Up:  Does the office need to follow up with this patient?: No  Instructions For The Office: N/A  RN Note:  Triaged RN reviewed drug infromation from [employer] and instructed pt the pills she is describing matches the image for Losartin.  Instructed that Losartin and Losartin Potassium are the same medicine per the literature.  Instructed that she call [facility] as well to double check that they did in fact fill the Rx correctly; will comply  Symptoms  Reason For Call & Symptoms: Pt is calling and states that Losartin Potassium 100mg  was started  beginning of May but when she received her Rx from [facility] the Losartin was just Losartin not Losartin Potassium; pt states that her pills are looking different than what she took before; she is not sure if [facility] filled the medication wrong by the way it looks;  caller wants to  know if this is the same medicine since the pills look different;  Reviewed Health History In EMR: N/A  Reviewed Medications In EMR: N/A  Reviewed Allergies In EMR: N/A  Reviewed Surgeries / Procedures: N/A  Date of Onset of Symptoms: Unknown  Guideline(s) Used:  No Protocol Available - Information Only  Disposition Per Guideline:   Home Care  Reason For Disposition Reached:   Information only question and nurse able to answer  Advice Given:  Call Back If:  New symptoms develop  Patient Will Follow Care Advice:  YES

## 2012-06-15 ENCOUNTER — Encounter: Payer: Commercial Managed Care - PPO | Admitting: Internal Medicine

## 2012-10-29 ENCOUNTER — Other Ambulatory Visit: Payer: Self-pay | Admitting: Internal Medicine

## 2012-11-17 ENCOUNTER — Other Ambulatory Visit: Payer: Self-pay

## 2013-01-10 ENCOUNTER — Other Ambulatory Visit: Payer: Self-pay | Admitting: Internal Medicine

## 2013-02-16 ENCOUNTER — Encounter: Payer: Self-pay | Admitting: Family Medicine

## 2013-02-16 ENCOUNTER — Ambulatory Visit (INDEPENDENT_AMBULATORY_CARE_PROVIDER_SITE_OTHER): Payer: Commercial Managed Care - PPO | Admitting: Family Medicine

## 2013-02-16 VITALS — BP 120/80 | HR 74 | Temp 98.2°F | Wt 170.0 lb

## 2013-02-16 DIAGNOSIS — G5600 Carpal tunnel syndrome, unspecified upper limb: Secondary | ICD-10-CM

## 2013-02-16 DIAGNOSIS — G5601 Carpal tunnel syndrome, right upper limb: Secondary | ICD-10-CM

## 2013-02-16 MED ORDER — MELOXICAM 15 MG PO TABS: 15.0000 mg | ORAL_TABLET | Freq: Every day | ORAL | Status: DC

## 2013-02-16 NOTE — Progress Notes (Signed)
Pre visit review using our clinic review tool, if applicable. No additional management support is needed unless otherwise documented below in the visit note. 

## 2013-02-16 NOTE — Patient Instructions (Signed)

## 2013-02-16 NOTE — Progress Notes (Signed)
   Subjective:    Patient ID: Dana DixonJoan M Dillon, female    DOB: 10/30/1955, 58 y.o.   MRN: [id number]  Hand Pain  Associated symptoms include numbness.   Patient seen with right hand pain. She describes fairly diffuse wrist and hand pain consistent with previous carpal tunnel syndrome. She's also had some intermittent pains right third digit PIP joint. No injury. 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 She's also had some intermittent cervical neck pain and occasional pains radiating from her neck down both arms. She has used wrist splints the past but inconsistently. She is right-hand dominant. She has occasional numbness involving mostly the thumb second and third digits of the right hand. No definite weakness. She has scheduled followup with hand orthopedist in April. She has taken occasional Motrin but inconsistently. She's had side effects with prednisone previously Past Medical History  Diagnosis Date  . LYMPHOPENIA 05/04/2007  . RHINITIS 12/03/2009  . FURUNCLE, RECURRENT 02/03/2008  . SHOULDER PAIN, LEFT 04/20/2008  . ELEVATED BLOOD PRESSURE WITHOUT DIAGNOSIS OF HYPERTENSION 11/13/2005   No past surgical history on file.  reports that she quit smoking about 19 years ago. Her smoking use included Cigarettes. She has a 15 pack-year smoking history. She has never used smokeless tobacco. She reports that she drinks about 3.5 ounces of alcohol per week. She reports that she does not use illicit drugs. family history includes Cancer in her sister; Hypertension in her mother. No Known Allergies    Review of Systems  Neurological: Positive for numbness. Negative for weakness.  Hematological: Negative for adenopathy.       Objective:   Physical Exam  Constitutional: She appears well-developed and well-nourished.  Cardiovascular: Normal rate and regular rhythm.   Pulmonary/Chest: Effort normal and breath sounds normal. No respiratory distress. She has no wheezes. She has no rales.    Musculoskeletal:  Upper extremities reveal no edema. She has full range of motion both wrists and all digits of the hand. No warmth or erythema. Tinel sign is negative.  Neurological:  She has good grip strength bilaterally. She has some mild weakness with from and fifth digit opposition. Deep tender reflexes are symmetric upper extremities. Full-strength upper extremities.          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 We've recommended consistent use of wrist splints, especially at night. Try meloxicam 15 mg once daily. We discussed possible steroids but she is reluctant because of previous insomnia issues. She has scheduled followup with orthopedist in April

## 2013-06-01 ENCOUNTER — Other Ambulatory Visit: Payer: Self-pay | Admitting: Internal Medicine

## 2013-07-31 ENCOUNTER — Ambulatory Visit (INDEPENDENT_AMBULATORY_CARE_PROVIDER_SITE_OTHER): Payer: Commercial Managed Care - PPO | Admitting: Family Medicine

## 2013-07-31 ENCOUNTER — Encounter: Payer: Self-pay | Admitting: Family Medicine

## 2013-07-31 VITALS — BP 98/64 | HR 106 | Temp 99.0°F | Ht 61.0 in | Wt 161.0 lb

## 2013-07-31 DIAGNOSIS — J0100 Acute maxillary sinusitis, unspecified: Secondary | ICD-10-CM

## 2013-07-31 MED ORDER — DOXYCYCLINE HYCLATE 100 MG PO TABS: 100.0000 mg | ORAL_TABLET | Freq: Two times a day (BID) | ORAL | Status: DC

## 2013-07-31 NOTE — Progress Notes (Signed)
No chief complaint on file.   HPI:  -started: about 10 days ago and now getting worse the last 2 days -symptoms:nasal congestion, sore throat, cough, sinus pain in cheeks and teeth, chills at times, no documented fevers -denies:fever, SOB, NVD, tooth pain -has tried: afrin, musinex, otc allergy treatments -sick contacts/travel/risks: denies flu exposure, tick exposure or or Ebola risks -Hx of: allergies, sinusits ROS: See pertinent positives and negatives per HPI.  Past Medical History  Diagnosis Date  . LYMPHOPENIA 05/04/2007  . RHINITIS 12/03/2009  . FURUNCLE, RECURRENT 02/03/2008  . SHOULDER PAIN, LEFT 04/20/2008  . ELEVATED BLOOD PRESSURE WITHOUT DIAGNOSIS OF HYPERTENSION 11/13/2005    No past surgical history on file.  Family History  Problem Relation Age of Onset  . Hypertension Mother   . Cancer Sister     breast    History   Social History  . Marital Status: Married    Spouse Name: N/A    Number of Children: N/A  . Years of Education: N/A   Social History Main Topics  . Smoking status: Former Smoker -- 1.00 packs/day for 15 years    Types: Cigarettes    Quit date: 05/20/1993  . Smokeless tobacco: Never Used  . Alcohol Use: 3.5 oz/week    7 drink(s) per week  . Drug Use: No  . Sexual Activity: None   Other Topics Concern  . None   Social History Narrative  . None    Current outpatient prescriptions:losartan (COZAAR) 100 MG tablet, TAKE 1 TABLET DAILY, Disp: 90 tablet, Rfl: 0;  oxymetazoline (AFRIN) 0.05 % nasal spray, Place 1 spray into both nostrils 2 (two) times daily., Disp: , Rfl: ;  venlafaxine XR (EFFEXOR-XR) 37.5 MG 24 hr capsule, Take 37.5 mg by mouth daily with breakfast., Disp: , Rfl:  doxycycline (VIBRA-TABS) 100 MG tablet, Take 1 tablet (100 mg total) by mouth 2 (two) times daily., Disp: 20 tablet, Rfl: 0  EXAM:  Filed Vitals:   07/31/13 1004  BP: 98/64  Pulse: 106  Temp: 99 F (37.2 C)    Body mass index is 30.44  kg/(m^2).  GENERAL: vitals reviewed and listed above, alert, oriented, appears well hydrated and in no acute distress  HEENT: atraumatic, conjunttiva clear, no obvious abnormalities on inspection of external nose and ears, normal appearance of ear canals and TMs, thick white nasal congestion, mild post oropharyngeal erythema with PND, no tonsillar edema or exudate, no sinus TTP  NECK: no obvious masses on inspection  LUNGS: clear to auscultation bilaterally, no wheezes, rales or rhonchi, good air movement  CV: HRRR, no peripheral edema  MS: moves all extremities without noticeable abnormality  PSYCH: pleasant and cooperative, no obvious depression or anxiety  ASSESSMENT AND PLAN:  Discussed the following assessment and plan:  Acute maxillary sinusitis, recurrence not specified - Plan: doxycycline (VIBRA-TABS) 100 MG tablet  -We discussed potential etiologies -We discussed treatment side effects, likely course, antibiotic misuse, transmission, and signs of developing a serious illness. -of course, we advised to return or notify a doctor immediately if symptoms worsen or persist or new concerns arise.    There are no Patient Instructions on file for this visit.   Kriste BasqueKIM, Crystal Ellwood R.

## 2013-07-31 NOTE — Progress Notes (Signed)
Pre visit review using our clinic review tool, if applicable. No additional management support is needed unless otherwise documented below in the visit note. 

## 2013-08-11 ENCOUNTER — Other Ambulatory Visit: Payer: Self-pay | Admitting: Internal Medicine

## 2013-08-11 ENCOUNTER — Telehealth: Payer: Self-pay | Admitting: Internal Medicine

## 2013-08-11 NOTE — Telephone Encounter (Signed)
Patient Information:  Caller Name: Dana Dillon  Phone: [phone]  Patient: Dana Dillon, Dana Dillon  Gender: Female  DOB: 01/27/1955  Age: [age] Years  PCP: Dana Dillon (Adults only, leaving end of July 2015)  Office Follow Up:  Does the office need to follow up with this patient?: Yes  Instructions For The Office: Does pt need another appt? or another script? please call pt  RN Note:  Also advised honey, tea, gargle w/warm saltwater for throat irritation and humidifier. Assured pt will send message for MD review to see if another appt needed or if MD has any other recommendations or wants to prescribe anything else. Assured her someone will call her back shortly with these recommendations/instructions.  Symptoms  Reason For Call & Symptoms: Seen in office 2 wks ago for sinus infection (had already been having sxs for 2 wks at that time)...prescribed abx and finished course earlier this week. States sxs have never resolved but did seem to be improving a little towards end of abx but today feels worse again. Having chills/sweats but doesn't have thermometer to check temp. Still has copious nasal drainage and also down back of throat and today throat soreness is new bothersome symptom. Still having sinus pain/pressure.  Reviewed Health History In EMR: Yes  Reviewed Medications In EMR: Yes  Reviewed Allergies In EMR: Yes  Reviewed Surgeries / Procedures: Yes  Date of Onset of Symptoms: 07/14/2013  Treatments Tried: finished course of abx; flonase  Treatments Tried Worked: No  Guideline(s) Used:  Sinus Pain and Congestion  Disposition Per Guideline:   See Today in Office  Reason For Disposition Reached:   Fever returns after gone for over 24 hours and symptoms worse or not improved  Advice Given:  For a Stuffy Nose - Use Nasal Washes:  Introduction: Saline (salt water) nasal irrigation (nasal wash) is an effective and simple home remedy for treating stuffy nose and sinus congestion. The nose can be  irrigated by pouring, spraying, or squirting salt water into the nose and then letting it run back out.  How it Helps: The salt water rinses out excess mucus, washes out any irritants (dust, allergens) that might be present, and moistens the nasal cavity.  Pain and Fever Medicines:  For pain or fever relief, take either acetaminophen or ibuprofen.  They are over-the-counter (OTC) drugs that help treat both fever and pain. You can buy them at the drugstore.  Extra Notes :  Use the lowest amount of medicine that makes your fever better.  Hydration:  Drink plenty of liquids (6-8 glasses of water daily). If the air in your home is dry, use a cool mist humidifier  Patient Will Follow Care Advice:  YES

## 2013-08-11 NOTE — Telephone Encounter (Signed)
See if pt can be seen in Saturday clinic

## 2013-12-31 ENCOUNTER — Other Ambulatory Visit: Payer: Self-pay | Admitting: Internal Medicine

## 2014-03-15 ENCOUNTER — Other Ambulatory Visit: Payer: Self-pay | Admitting: Obstetrics and Gynecology

## 2014-03-20 ENCOUNTER — Other Ambulatory Visit: Payer: Self-pay | Admitting: Obstetrics and Gynecology

## 2014-03-20 DIAGNOSIS — R928 Other abnormal and inconclusive findings on diagnostic imaging of breast: Secondary | ICD-10-CM

## 2014-03-20 LAB — CYTOLOGY - PAP

## 2014-03-26 ENCOUNTER — Other Ambulatory Visit: Payer: Self-pay | Admitting: Obstetrics and Gynecology

## 2014-03-26 ENCOUNTER — Ambulatory Visit
Admission: RE | Admit: 2014-03-26 | Discharge: 2014-03-26 | Disposition: A | Discharge status: Home / Self Care | Payer: Commercial Managed Care - PPO | Source: Ambulatory Visit | Attending: Obstetrics and Gynecology | Admitting: Obstetrics and Gynecology

## 2014-03-26 DIAGNOSIS — R928 Other abnormal and inconclusive findings on diagnostic imaging of breast: Secondary | ICD-10-CM

## 2014-03-26 LAB — HM MAMMOGRAPHY

## 2014-03-28 ENCOUNTER — Other Ambulatory Visit: Payer: Self-pay | Admitting: Obstetrics and Gynecology

## 2014-03-29 LAB — CYTOLOGY - PAP

## 2014-05-25 ENCOUNTER — Encounter: Payer: Self-pay | Admitting: *Deleted

## 2014-06-26 ENCOUNTER — Other Ambulatory Visit: Payer: Self-pay | Admitting: *Deleted

## 2014-06-29 ENCOUNTER — Ambulatory Visit (INDEPENDENT_AMBULATORY_CARE_PROVIDER_SITE_OTHER): Payer: Commercial Managed Care - PPO | Admitting: Family

## 2014-06-29 ENCOUNTER — Encounter: Payer: Self-pay | Admitting: Family

## 2014-06-29 VITALS — BP 142/90 | HR 91 | Temp 99.0°F | Wt 159.0 lb

## 2014-06-29 DIAGNOSIS — N951 Menopausal and female climacteric states: Secondary | ICD-10-CM

## 2014-06-29 DIAGNOSIS — R232 Flushing: Secondary | ICD-10-CM

## 2014-06-29 DIAGNOSIS — I10 Essential (primary) hypertension: Secondary | ICD-10-CM

## 2014-06-29 MED ORDER — LOSARTAN POTASSIUM 100 MG PO TABS: ORAL_TABLET | ORAL | Status: DC

## 2014-06-29 MED ORDER — VENLAFAXINE HCL ER 37.5 MG PO CP24: 37.5000 mg | ORAL_CAPSULE | Freq: Every day | ORAL | Status: DC

## 2014-06-29 NOTE — Patient Instructions (Signed)
Cardiac Diet This diet can help prevent heart disease and stroke. Many factors influence your heart health, including eating and exercise habits. Coronary risk rises a lot with abnormal blood fat (lipid) levels. Cardiac meal planning includes limiting unhealthy fats, increasing healthy fats, and making other small dietary changes. General guidelines are as follows:  Adjust calorie intake to reach and maintain desirable body weight.  Limit total fat intake to less than 30% of total calories. Saturated fat should be less than 7% of calories.  Saturated fats are found in animal products and in some vegetable products. Saturated vegetable fats are found in coconut oil, cocoa butter, palm oil, and palm kernel oil. Read labels carefully to avoid these products as much as possible. Use butter in moderation. Choose tub margarines and oils that have 2 grams of fat or less. Good cooking oils are canola and olive oils.  Practice low-fat cooking techniques. Do not fry food. Instead, broil, bake, boil, steam, grill, roast on a rack, stir-fry, or microwave it. Other fat reducing suggestions include:  Remove the skin from poultry.  Remove all visible fat from meats.  Skim the fat off stews, soups, and gravies before serving them.  Steam vegetables in water or broth instead of sauting them in fat.  Avoid foods with trans fat (or hydrogenated oils), such as commercially fried foods and commercially baked goods. Commercial shortening and deep-frying fats will contain trans fat.  Increase intake of fruits, vegetables, whole grains, and legumes to replace foods high in fat.  Increase consumption of nuts, legumes, and seeds to at least 4 servings weekly. One serving of a legume equals  cup, and 1 serving of nuts or seeds equals  cup.  Choose whole grains more often. Have 3 servings per day (a serving is 1 ounce [oz]).  Eat 4 to 5 servings of vegetables per day. A serving of vegetables is 1 cup of raw leafy  vegetables;  cup of raw or cooked cut-up vegetables;  cup of vegetable juice.  Eat 4 to 5 servings of fruit per day. A serving of fruit is 1 medium whole fruit;  cup of dried fruit;  cup of fresh, frozen, or canned fruit;  cup of 100% fruit juice.  Increase your intake of dietary fiber to 20 to 30 grams per day. Insoluble fiber may help lower your risk of heart disease and may help curb your appetite.  Soluble fiber binds cholesterol to be removed from the blood. Foods high in soluble fiber are dried beans, citrus fruits, oats, apples, bananas, broccoli, Brussels sprouts, and eggplant.  Try to include foods fortified with plant sterols or stanols, such as yogurt, breads, juices, or margarines. Choose several fortified foods to achieve a daily intake of 2 to 3 grams of plant sterols or stanols.  Foods with omega-3 fats can help reduce your risk of heart disease. Aim to have a 3.5 oz portion of fatty fish twice per week, such as salmon, mackerel, albacore tuna, sardines, lake trout, or herring. If you wish to take a fish oil supplement, choose one that contains 1 gram of both DHA and EPA.  Limit processed meats to 2 servings (3 oz portion) weekly.  Limit the sodium in your diet to 1500 milligrams (mg) per day. If you have high blood pressure, talk to a registered dietitian about a DASH (Dietary Approaches to Stop Hypertension) eating plan.  Limit sweets and beverages with added sugar, such as soda, to no more than 5 servings per week. One   serving is:   1 tablespoon sugar.  1 tablespoon jelly or jam.   cup sorbet.  1 cup lemonade.   cup regular soda. CHOOSING FOODS Starches  Allowed: Breads: All kinds (wheat, rye, raisin, white, oatmeal, Italian, French, and English muffin bread). Low-fat rolls: English muffins, frankfurter and hamburger buns, bagels, pita bread, tortillas (not fried). Pancakes, waffles, biscuits, and muffins made with recommended oil.  Avoid: Products made with  saturated or trans fats, oils, or whole milk products. Butter rolls, cheese breads, croissants. Commercial doughnuts, muffins, sweet rolls, biscuits, waffles, pancakes, store-bought mixes. Crackers  Allowed: Low-fat crackers and snacks: Animal, graham, rye, saltine (with recommended oil, no lard), oyster, and matzo crackers. Bread sticks, melba toast, rusks, flatbread, pretzels, and light popcorn.  Avoid: High-fat crackers: cheese crackers, butter crackers, and those made with coconut, palm oil, or trans fat (hydrogenated oils). Buttered popcorn. Cereals  Allowed: Hot or cold whole-grain cereals.  Avoid: Cereals containing coconut, hydrogenated vegetable fat, or animal fat. Potatoes / Pasta / Rice  Allowed: All kinds of potatoes, rice, and pasta (such as macaroni, spaghetti, and noodles).  Avoid: Pasta or rice prepared with cream sauce or high-fat cheese. Chow mein noodles, French fries. Vegetables  Allowed: All vegetables and vegetable juices.  Avoid: Fried vegetables. Vegetables in cream, butter, or high-fat cheese sauces. Limit coconut. Fruit in cream or custard. Protein  Allowed: Limit your intake of meat, seafood, and poultry to no more than 6 oz (cooked weight) per day. All lean, well-trimmed beef, veal, pork, and lamb. All chicken and turkey without skin. All fish and shellfish. Wild game: wild duck, rabbit, pheasant, and venison. Egg whites or low-cholesterol egg substitutes may be used as desired. Meatless dishes: recipes with dried beans, peas, lentils, and tofu (soybean curd). Seeds and nuts: all seeds and most nuts.  Avoid: Prime grade and other heavily marbled and fatty meats, such as short ribs, spare ribs, rib eye roast or steak, frankfurters, sausage, bacon, and high-fat luncheon meats, mutton. Caviar. Commercially fried fish. Domestic duck, goose, venison sausage. Organ meats: liver, gizzard, heart, chitterlings, brains, kidney, sweetbreads. Dairy  Allowed: Low-fat  cheeses: nonfat or low-fat cottage cheese (1% or 2% fat), cheeses made with part skim milk, such as mozzarella, farmers, string, or ricotta. (Cheeses should be labeled no more than 2 to 6 grams fat per oz.). Skim (or 1%) milk: liquid, powdered, or evaporated. Buttermilk made with low-fat milk. Drinks made with skim or low-fat milk or cocoa. Chocolate milk or cocoa made with skim or low-fat (1%) milk. Nonfat or low-fat yogurt.  Avoid: Whole milk cheeses, including colby, cheddar, muenster, Monterey Jack, Havarti, Brie, Camembert, American, Swiss, and blue. Creamed cottage cheese, cream cheese. Whole milk and whole milk products, including buttermilk or yogurt made from whole milk, drinks made from whole milk. Condensed milk, evaporated whole milk, and 2% milk. Soups and Combination Foods  Allowed: Low-fat low-sodium soups: broth, dehydrated soups, homemade broth, soups with the fat removed, homemade cream soups made with skim or low-fat milk. Low-fat spaghetti, lasagna, chili, and Spanish rice if low-fat ingredients and low-fat cooking techniques are used.  Avoid: Cream soups made with whole milk, cream, or high-fat cheese. All other soups. Desserts and Sweets  Allowed: Sherbet, fruit ices, gelatins, meringues, and angel food cake. Homemade desserts with recommended fats, oils, and milk products. Jam, jelly, honey, marmalade, sugars, and syrups. Pure sugar candy, such as gum drops, hard candy, jelly beans, marshmallows, mints, and small amounts of dark chocolate.  Avoid: Commercially prepared   cakes, pies, cookies, frosting, pudding, or mixes for these products. Desserts containing whole milk products, chocolate, coconut, lard, palm oil, or palm kernel oil. Ice cream or ice cream drinks. Candy that contains chocolate, coconut, butter, hydrogenated fat, or unknown ingredients. Buttered syrups. Fats and Oils  Allowed: Vegetable oils: safflower, sunflower, corn, soybean, cottonseed, sesame, canola, olive,  or peanut. Non-hydrogenated margarines. Salad dressing or mayonnaise: homemade or commercial, made with a recommended oil. Low or nonfat salad dressing or mayonnaise.  Limit added fats and oils to 6 to 8 tsp per day (includes fats used in cooking, baking, salads, and spreads on bread). Remember to count the "hidden fats" in foods.  Avoid: Solid fats and shortenings: butter, lard, salt pork, bacon drippings. Gravy containing meat fat, shortening, or suet. Cocoa butter, coconut. Coconut oil, palm oil, palm kernel oil, or hydrogenated oils: these ingredients are often used in bakery products, nondairy creamers, whipped toppings, candy, and commercially fried foods. Read labels carefully. Salad dressings made of unknown oils, sour cream, or cheese, such as blue cheese and Roquefort. Cream, all kinds: half-and-half, light, heavy, or whipping. Sour cream or cream cheese (even if "light" or low-fat). Nondairy cream substitutes: coffee creamers and sour cream substitutes made with palm, palm kernel, hydrogenated oils, or coconut oil. Beverages  Allowed: Coffee (regular or decaffeinated), tea. Diet carbonated beverages, mineral water. Alcohol: Check with your caregiver. Moderation is recommended.  Avoid: Whole milk, regular sodas, and juice drinks with added sugar. Condiments  Allowed: All seasonings and condiments. Cocoa powder. "Cream" sauces made with recommended ingredients.  Avoid: Carob powder made with hydrogenated fats. SAMPLE MENU Breakfast   cup orange juice   cup oatmeal  1 slice toast  1 tsp margarine  1 cup skim milk Lunch  Turkey sandwich with 2 oz turkey, 2 slices bread  Lettuce and tomato slices  Fresh fruit  Carrot sticks  Coffee or tea Snack  Fresh fruit or low-fat crackers Dinner  3 oz lean ground beef  1 baked potato  1 tsp margarine   cup asparagus  Lettuce salad  1 tbs non-creamy dressing   cup peach slices  1 cup skim milk Document Released:  10/08/2007 Document Revised: 06/30/2011 Document Reviewed: 02/28/2013 [employer] Patient Information 2015 [employer]. This information is not intended to replace advice given to you by your health care provider. Make sure you discuss any questions you have with your health care provider.  

## 2014-06-29 NOTE — Progress Notes (Signed)
Subjective:    Patient ID: Dana Dillon, female    DOB: [birth year], 59 y.o.   MRN: [id number]  HPI 59 year old white female, nonsmoker with a history of hypertension and hot flashes related to menopause is in today for recheck. She's currently on Cozaar 100 mg once daily. However, has been off the medication for several weeks because she didn't have refills. Blood pressure elevated today. Takes Effexor 37.5 mg for hot flashes that works well. However, she reports that she is beginning to wean herself off of the medication because she does not have hot flashes as she once did.   Review of Systems  HENT: Negative.   Respiratory: Negative.   Cardiovascular: Negative.   Gastrointestinal: Negative.   Endocrine: Negative.   Genitourinary: Negative.   Musculoskeletal: Negative.   Neurological: Negative.   Hematological: Negative.   Psychiatric/Behavioral: Negative.   All other systems reviewed and are negative.  Past Medical History  Diagnosis Date  . LYMPHOPENIA 05/04/2007  . RHINITIS 12/03/2009  . FURUNCLE, RECURRENT 02/03/2008  . SHOULDER PAIN, LEFT 04/20/2008  . ELEVATED BLOOD PRESSURE WITHOUT DIAGNOSIS OF HYPERTENSION 11/13/2005    History   Social History  . Marital Status: Married    Spouse Name: N/A  . Number of Children: N/A  . Years of Education: N/A   Occupational History  . Not on file.   Social History Main Topics  . Smoking status: Former Smoker -- 1.00 packs/day for 15 years    Types: Cigarettes    Quit date: 05/20/1993  . Smokeless tobacco: Never Used  . Alcohol Use: 3.5 oz/week    7 drink(s) per week  . Drug Use: No  . Sexual Activity: Not on file   Other Topics Concern  . Not on file   Social History Narrative    No past surgical history on file.  Family History  Problem Relation Age of Onset  . Hypertension Mother   . Cancer Sister     breast    No Known Allergies  Current Outpatient Prescriptions on File Prior to Visit  Medication Sig  Dispense Refill  . venlafaxine XR (EFFEXOR-XR) 37.5 MG 24 hr capsule Take 37.5 mg by mouth daily with breakfast.    . oxymetazoline (AFRIN) 0.05 % nasal spray Place 1 spray into both nostrils 2 (two) times daily.     No current facility-administered medications on file prior to visit.    BP 142/90 mmHg  Pulse 91  Temp(Src) 99 F (37.2 C) (Oral)  Wt 159 lb (72.122 kg)chart    Objective:   Physical Exam  Constitutional: She is oriented to person, place, and time. She appears well-developed and well-nourished.  HENT:  Right Ear: External ear normal.  Left Ear: External ear normal.  Nose: Nose normal.  Mouth/Throat: Oropharynx is clear and moist.  Neck: Normal range of motion. Neck supple.  Cardiovascular: Normal rate, regular rhythm and normal heart sounds.   Pulmonary/Chest: Effort normal and breath sounds normal.  Abdominal: Soft. Bowel sounds are normal.  Musculoskeletal: Normal range of motion.  Neurological: She is alert and oriented to person, place, and time.  Skin: Skin is warm and dry.  Psychiatric: She has a normal mood and affect.          Assessment & Plan:  Dana Dillon was seen today for follow-up.  Diagnoses and all orders for this visit:  Essential hypertension  Hot flashes  Other orders -     losartan (COZAAR) 100 MG tablet; TAKE 1 TABLET  DAILY (NEEDS OFFICE VISIT) -     venlafaxine XR (EFFEXOR-XR) 37.5 MG 24 hr capsule; Take 1 capsule (37.5 mg total) by mouth daily with breakfast.   Patient declines labs. Reports having labs done through gynecology. Continue current medications. Follow-up in 6 months and sooner as needed.

## 2014-06-29 NOTE — Progress Notes (Signed)
Pre visit review using our clinic review tool, if applicable. No additional management support is needed unless otherwise documented below in the visit note. 

## 2014-08-08 ENCOUNTER — Encounter: Payer: Self-pay | Admitting: *Deleted

## 2014-12-08 ENCOUNTER — Other Ambulatory Visit: Payer: Self-pay | Admitting: Family

## 2014-12-25 ENCOUNTER — Ambulatory Visit (INDEPENDENT_AMBULATORY_CARE_PROVIDER_SITE_OTHER): Payer: Commercial Managed Care - PPO | Admitting: Family Medicine

## 2014-12-25 VITALS — BP 118/82 | HR 92 | Temp 98.1°F | Ht 61.0 in | Wt 166.2 lb

## 2014-12-25 DIAGNOSIS — J0100 Acute maxillary sinusitis, unspecified: Secondary | ICD-10-CM

## 2014-12-25 MED ORDER — AMOXICILLIN-POT CLAVULANATE 875-125 MG PO TABS: 1.0000 | ORAL_TABLET | Freq: Two times a day (BID) | ORAL | Status: DC

## 2014-12-25 NOTE — Progress Notes (Signed)
HPI:  "sinus congestion" -started: 3 weeks ago -symptoms:nasal congestion, sore throat, cough - worsening the last week with L max sinus and upper tooth pain -denies:fever, SOB, NVD -has tried: musinex, flonase, antihistamine -sick contacts/travel/risks: denies flu exposure, tick exposure or or Ebola risks -Hx of: allergies and sinusitis -schedule for NPV with Dr. Durene Cal  ROS: See pertinent positives and negatives per HPI.  Past Medical History  Diagnosis Date  . LYMPHOPENIA 05/04/2007  . RHINITIS 12/03/2009  . FURUNCLE, RECURRENT 02/03/2008  . SHOULDER PAIN, LEFT 04/20/2008  . ELEVATED BLOOD PRESSURE WITHOUT DIAGNOSIS OF HYPERTENSION 11/13/2005    No past surgical history on file.  Family History  Problem Relation Age of Onset  . Hypertension Mother   . Cancer Sister     breast    Social History   Social History  . Marital Status: Married    Spouse Name: N/A  . Number of Children: N/A  . Years of Education: N/A   Social History Main Topics  . Smoking status: Former Smoker -- 1.00 packs/day for 15 years    Types: Cigarettes    Quit date: 05/20/1993  . Smokeless tobacco: Never Used  . Alcohol Use: 3.5 oz/week    7 drink(s) per week  . Drug Use: No  . Sexual Activity: Not on file   Other Topics Concern  . Not on file   Social History Narrative     Current outpatient prescriptions:  .  losartan (COZAAR) 100 MG tablet, TAKE 1 TABLET DAILY (NEEDS OFFICE VISIT), Disp: 90 tablet, Rfl: 3 .  MAGNESIUM CITRATE PO, Take by mouth., Disp: , Rfl:  .  venlafaxine XR (EFFEXOR-XR) 37.5 MG 24 hr capsule, TAKE 1 CAPSULE DAILY WITH BREAKFAST, Disp: 90 capsule, Rfl: 0 .  amoxicillin-clavulanate (AUGMENTIN) 875-125 MG tablet, Take 1 tablet by mouth 2 (two) times daily., Disp: 20 tablet, Rfl: 0  EXAM:  Filed Vitals:   12/25/14 1308  BP: 118/82  Pulse: 92  Temp: 98.1 F (36.7 C)    Body mass index is 31.42 kg/(m^2).  GENERAL: vitals reviewed and listed above, alert,  oriented, appears well hydrated and in no acute distress  HEENT: atraumatic, conjunttiva clear, no obvious abnormalities on inspection of external nose and ears, normal appearance of ear canals and TMs - effusion R, thick nasal congestion, mild post oropharyngeal erythema with PND, no tonsillar edema or exudate, no sinus TTP  NECK: no obvious masses on inspection  LUNGS: clear to auscultation bilaterally, no wheezes, rales or rhonchi, good air movement  CV: HRRR, no peripheral edema  MS: moves all extremities without noticeable abnormality  PSYCH: pleasant and cooperative, no obvious depression or anxiety  ASSESSMENT AND PLAN:  Discussed the following assessment and plan:  Acute maxillary sinusitis, recurrence not specified  -Sinusitis suspected. Opted to treat with Augmentin. We discussed treatment side effects, likely course, antibiotic misuse, transmission, and signs of developing a serious or worsening illness. -of course, we advised to return or notify a doctor immediately if symptoms worsen or persist or new concerns arise.    Patient Instructions  Take the antibiotic (AUGMENTIN) as instructed  INSTRUCTIONS FOR UPPER RESPIRATORY INFECTION:  -plenty of rest and fluids  -nasal saline wash 2-3 times daily (use prepackaged nasal saline or bottled/distilled water if making your own)   -can use AFRIN nasal spray for drainage and nasal congestion - but do NOT use longer then 3-4 days  -can use tylenol (in no history of liver disease) or ibuprofen (if no history  of kidney disease, bowel bleeding or significant heart disease) as directed for aches and sorethroat  -in the winter time, using a humidifier at night is helpful (please follow cleaning instructions)  -if you are taking a cough medication - use only as directed, may also try a teaspoon of honey to coat the throat and throat lozenges. If given a cough medication with codeine or hydrocodone or other narcotic please be  advised that this contains a strong and  potentially addicting medication. Please follow instructions carefully, take as little as possible and only use AS NEEDED for severe cough. Discuss potential side effects with your pharmacy. Please do not drive or operate machinery while taking these types of medications. Please do not take other sedating medications, drugs or alcohol while taking this medication without discussing with your doctor.  -for sore throat, salt water gargles can help  -follow up if you have fevers, persistent facial pain, persistent tooth pain, difficulty breathing or are worsening or symptoms persist longer then expected       KIM, HANNAH R.

## 2014-12-25 NOTE — Progress Notes (Signed)
Pre visit review using our clinic review tool, if applicable. No additional management support is needed unless otherwise documented below in the visit note. 

## 2014-12-25 NOTE — Patient Instructions (Addendum)
Take the antibiotic (AUGMENTIN) as instructed  INSTRUCTIONS FOR UPPER RESPIRATORY INFECTION:  -plenty of rest and fluids  -nasal saline wash 2-3 times daily (use prepackaged nasal saline or bottled/distilled water if making your own)   -can use AFRIN nasal spray for drainage and nasal congestion - but do NOT use longer then 3-4 days  -can use tylenol (in no history of liver disease) or ibuprofen (if no history of kidney disease, bowel bleeding or significant heart disease) as directed for aches and sorethroat  -in the winter time, using a humidifier at night is helpful (please follow cleaning instructions)  -if you are taking a cough medication - use only as directed, may also try a teaspoon of honey to coat the throat and throat lozenges. If given a cough medication with codeine or hydrocodone or other narcotic please be advised that this contains a strong and  potentially addicting medication. Please follow instructions carefully, take as little as possible and only use AS NEEDED for severe cough. Discuss potential side effects with your pharmacy. Please do not drive or operate machinery while taking these types of medications. Please do not take other sedating medications, drugs or alcohol while taking this medication without discussing with your doctor.  -for sore throat, salt water gargles can help  -follow up if you have fevers, persistent facial pain, persistent tooth pain, difficulty breathing or are worsening or symptoms persist longer then expected

## 2015-02-22 ENCOUNTER — Ambulatory Visit (INDEPENDENT_AMBULATORY_CARE_PROVIDER_SITE_OTHER): Payer: Commercial Managed Care - PPO | Admitting: Family Medicine

## 2015-02-22 ENCOUNTER — Encounter: Payer: Self-pay | Admitting: Family Medicine

## 2015-02-22 VITALS — BP 118/80 | HR 92 | Temp 98.3°F | Ht 61.0 in | Wt 163.5 lb

## 2015-02-22 DIAGNOSIS — J069 Acute upper respiratory infection, unspecified: Secondary | ICD-10-CM

## 2015-02-22 NOTE — Progress Notes (Signed)
Pre visit review using our clinic review tool, if applicable. No additional management support is needed unless otherwise documented below in the visit note. 

## 2015-02-22 NOTE — Patient Instructions (Signed)

## 2015-02-22 NOTE — Progress Notes (Signed)
HPI:   URI -started: 4-5 days ago -symptoms:nasal congestion, sore throat, cough, PND -denies:fever, SOB, NVD -has tried: OTC options for allergies -sick contacts/travel/risks: denies flu exposure, multiple family members with UR symptoms and tonsillitis -Hx of: allergies ROS: See pertinent positives and negatives per HPI.  Past Medical History  Diagnosis Date  . LYMPHOPENIA 05/04/2007  . RHINITIS 12/03/2009  . FURUNCLE, RECURRENT 02/03/2008  . SHOULDER PAIN, LEFT 04/20/2008  . ELEVATED BLOOD PRESSURE WITHOUT DIAGNOSIS OF HYPERTENSION 11/13/2005    No past surgical history on file.  Family History  Problem Relation Age of Onset  . Hypertension Mother   . Cancer Sister     breast    Social History   Social History  . Marital Status: Married    Spouse Name: N/A  . Number of Children: N/A  . Years of Education: N/A   Social History Main Topics  . Smoking status: Former Smoker -- 1.00 packs/day for 15 years    Types: Cigarettes    Quit date: 05/20/1993  . Smokeless tobacco: Never Used  . Alcohol Use: 3.5 oz/week    7 drink(s) per week  . Drug Use: No  . Sexual Activity: Not Asked   Other Topics Concern  . None   Social History Narrative     Current outpatient prescriptions:  .  losartan (COZAAR) 100 MG tablet, TAKE 1 TABLET DAILY (NEEDS OFFICE VISIT), Disp: 90 tablet, Rfl: 3 .  MAGNESIUM CITRATE PO, Take by mouth., Disp: , Rfl:  .  venlafaxine XR (EFFEXOR-XR) 37.5 MG 24 hr capsule, TAKE 1 CAPSULE DAILY WITH BREAKFAST, Disp: 90 capsule, Rfl: 0  EXAM:  Filed Vitals:   02/22/15 1108  BP: 118/80  Pulse: 92  Temp: 98.3 F (36.8 C)    Body mass index is 30.91 kg/(m^2).  GENERAL: vitals reviewed and listed above, alert, oriented, appears well hydrated and in no acute distress  HEENT: atraumatic, conjunttiva clear, no obvious abnormalities on inspection of external nose and ears, normal appearance of ear canals and TMs, clear nasal congestion, mild post  oropharyngeal erythema with PND, no tonsillar edema or exudate, no sinus TTP  NECK: no obvious masses on inspection  LUNGS: clear to auscultation bilaterally, no wheezes, rales or rhonchi, good air movement  CV: HRRR, no peripheral edema  MS: moves all extremities without noticeable abnormality  PSYCH: pleasant and cooperative, no obvious depression or anxiety  ASSESSMENT AND PLAN:  Discussed the following assessment and plan:  Upper respiratory infection  -given HPI and exam findings today, a serious infection or illness is unlikely. We discussed potential etiologies, with VURI being most likely, and advised supportive care and monitoring. We discussed treatment side effects, likely course, antibiotic misuse, transmission, and signs of developing a serious illness. -of course, we advised to return or notify a doctor immediately if symptoms worsen or persist or new concerns arise.    Patient Instructions  INSTRUCTIONS FOR UPPER RESPIRATORY INFECTION:  -plenty of rest and fluids  -nasal saline wash 2-3 times daily (use prepackaged nasal saline or bottled/distilled water if making your own)   -can use AFRIN nasal spray for drainage and nasal congestion - but do NOT use longer then 3-4 days  -can use tylenol (in no history of liver disease) or ibuprofen (if no history of kidney disease, bowel bleeding or significant heart disease) as directed for aches and sorethroat  -in the winter time, using a humidifier at night is helpful (please follow cleaning instructions)  -if you are taking  a cough medication - use only as directed, may also try a teaspoon of honey to coat the throat and throat lozenges. If given a cough medication with codeine or hydrocodone or other narcotic please be advised that this contains a strong and  potentially addicting medication. Please follow instructions carefully, take as little as possible and only use AS NEEDED for severe cough. Discuss potential side  effects with your pharmacy. Please do not drive or operate machinery while taking these types of medications. Please do not take other sedating medications, drugs or alcohol while taking this medication without discussing with your doctor.  -for sore throat, salt water gargles can help  -follow up if you have fevers, facial pain, tooth pain, difficulty breathing or are worsening or symptoms persist longer then expected  Upper Respiratory Infection, Adult An upper respiratory infection (URI) is also known as the common cold. It is often caused by a type of germ (virus). Colds are easily spread (contagious). You can pass it to others by kissing, coughing, sneezing, or drinking out of the same glass. Usually, you get better in 1 to 3  weeks.  However, the cough can last for even longer. HOME CARE   Only take medicine as told by your doctor. Follow instructions provided above.  Drink enough water and fluids to keep your pee (urine) clear or pale yellow.  Get plenty of rest.  Return to work when your temperature is < 100 for 24 hours or as told by your doctor. You may use a face mask and wash your hands to stop your cold from spreading. GET HELP RIGHT AWAY IF:   After the first few days, you feel you are getting worse.  You have questions about your medicine.  You have chills, shortness of breath, or red spit (mucus).  You have pain in the face for more then 1-2 days, especially when you bend forward.  You have a fever, puffy (swollen) neck, pain when you swallow, or white spots in the back of your throat.  You have a bad headache, ear pain, sinus pain, or chest pain.  You have a high-pitched whistling sound when you breathe in and out (wheezing).  You cough up blood.  You have sore muscles or a stiff neck. MAKE SURE YOU:   Understand these instructions.  Will watch your condition.  Will get help right away if you are not doing well or get worse. Document Released: 06/17/2007  Document Revised: 03/23/2011 Document Reviewed: 04/05/2013 [employer] Patient Information 2015 [place], [place]. This information is not intended to replace advice given to you by your health care provider. Make sure you discuss any questions you have with your health care provider.      Kriste Basque R.

## 2015-03-08 ENCOUNTER — Other Ambulatory Visit: Payer: Self-pay | Admitting: Family

## 2015-06-06 ENCOUNTER — Other Ambulatory Visit: Payer: Self-pay | Admitting: Family

## 2015-06-20 NOTE — Telephone Encounter (Signed)
Pt to establish with Dr. Durene CalHunter in Sept 2017

## 2015-08-22 LAB — HM COLONOSCOPY

## 2015-09-23 ENCOUNTER — Ambulatory Visit: Payer: Commercial Managed Care - PPO | Admitting: Family Medicine

## 2015-12-03 ENCOUNTER — Other Ambulatory Visit: Payer: Self-pay | Admitting: Family Medicine

## 2015-12-04 ENCOUNTER — Ambulatory Visit: Payer: Commercial Managed Care - PPO | Admitting: Family Medicine

## 2015-12-27 ENCOUNTER — Other Ambulatory Visit: Payer: Self-pay | Admitting: Family Medicine

## 2015-12-27 ENCOUNTER — Ambulatory Visit (INDEPENDENT_AMBULATORY_CARE_PROVIDER_SITE_OTHER): Payer: Commercial Managed Care - PPO | Admitting: Family Medicine

## 2015-12-27 ENCOUNTER — Encounter: Payer: Self-pay | Admitting: Family Medicine

## 2015-12-27 VITALS — BP 138/82 | HR 84 | Temp 98.3°F | Ht 61.0 in | Wt 177.8 lb

## 2015-12-27 DIAGNOSIS — R5383 Other fatigue: Secondary | ICD-10-CM | POA: Diagnosis not present

## 2015-12-27 DIAGNOSIS — Z87891 Personal history of nicotine dependence: Secondary | ICD-10-CM

## 2015-12-27 DIAGNOSIS — I10 Essential (primary) hypertension: Secondary | ICD-10-CM | POA: Diagnosis not present

## 2015-12-27 DIAGNOSIS — Z1159 Encounter for screening for other viral diseases: Secondary | ICD-10-CM

## 2015-12-27 DIAGNOSIS — Z7289 Other problems related to lifestyle: Secondary | ICD-10-CM

## 2015-12-27 DIAGNOSIS — N951 Menopausal and female climacteric states: Secondary | ICD-10-CM

## 2015-12-27 LAB — CBC WITH DIFFERENTIAL/PLATELET
BASOS PCT: 0.6 % (ref 0.0–3.0)
Basophils Absolute: 0 10*3/uL (ref 0.0–0.1)
EOS ABS: 0.2 10*3/uL (ref 0.0–0.7)
Eosinophils Relative: 3.3 % (ref 0.0–5.0)
HCT: 37.3 % (ref 36.0–46.0)
Hemoglobin: 12.6 g/dL (ref 12.0–15.0)
LYMPHS ABS: 2.5 10*3/uL (ref 0.7–4.0)
Lymphocytes Relative: 42.4 % (ref 12.0–46.0)
MCHC: 33.9 g/dL (ref 30.0–36.0)
MCV: 86.3 fl (ref 78.0–100.0)
MONO ABS: 0.6 10*3/uL (ref 0.1–1.0)
Monocytes Relative: 9.9 % (ref 3.0–12.0)
NEUTROS ABS: 2.5 10*3/uL (ref 1.4–7.7)
Neutrophils Relative %: 43.8 % (ref 43.0–77.0)
PLATELETS: 187 10*3/uL (ref 150.0–400.0)
RBC: 4.32 Mil/uL (ref 3.87–5.11)
RDW: 14.5 % (ref 11.5–15.5)
WBC: 5.8 10*3/uL (ref 4.0–10.5)

## 2015-12-27 LAB — COMPREHENSIVE METABOLIC PANEL
ALT: 27 U/L (ref 0–35)
AST: 26 U/L (ref 0–37)
Albumin: 4.5 g/dL (ref 3.5–5.2)
Alkaline Phosphatase: 54 U/L (ref 39–117)
BUN: 20 mg/dL (ref 6–23)
CHLORIDE: 104 meq/L (ref 96–112)
CO2: 29 meq/L (ref 19–32)
CREATININE: 0.78 mg/dL (ref 0.40–1.20)
Calcium: 9 mg/dL (ref 8.4–10.5)
GFR: 79.7 mL/min (ref >60.00)
Glucose, Bld: 81 mg/dL (ref 70–99)
Potassium: 4 mEq/L (ref 3.5–5.1)
SODIUM: 139 meq/L (ref 135–145)
Total Bilirubin: 0.5 mg/dL (ref 0.2–1.2)
Total Protein: 6.8 g/dL (ref 6.0–8.3)

## 2015-12-27 LAB — VITAMIN D 25 HYDROXY (VIT D DEFICIENCY, FRACTURES): VITD: 39.05 ng/mL (ref 30.00–100.00)

## 2015-12-27 LAB — HEPATITIS C ANTIBODY: HCV AB: NEGATIVE

## 2015-12-27 LAB — T4, FREE: FREE T4: 0.74 ng/dL (ref 0.60–1.60)

## 2015-12-27 LAB — T3, FREE: T3, Free: 3 pg/mL (ref 2.3–4.2)

## 2015-12-27 LAB — VITAMIN B12: Vitamin B-12: >1500 pg/mL — ABNORMAL HIGH (ref 211–911)

## 2015-12-27 LAB — TSH: TSH: 2.1 u[IU]/mL (ref 0.35–4.50)

## 2015-12-27 MED ORDER — LOSARTAN POTASSIUM 100 MG PO TABS: ORAL_TABLET | ORAL | 3 refills | Status: DC

## 2015-12-27 NOTE — Progress Notes (Signed)
Pre visit review using our clinic review tool, if applicable. No additional management support is needed unless otherwise documented below in the visit note. 

## 2015-12-27 NOTE — Patient Instructions (Addendum)
Health Maintenance Due  Topic Date Due  . Hepatitis C Screening - future labs July 01, 1955  . HIV Screening -declines 08/12/1969  . TETANUS/TDAP - today 08/12/1973  . COLONOSCOPY - Sign release of information at the check out desk for records 08/12/2004  . ZOSTAVAX - consider next year 08/13/2014   Sign release of information at the check out desk for mammogram from this year from GYN   Labs before you leave to investigate fatigue

## 2015-12-27 NOTE — Progress Notes (Addendum)
Subjective:  Dana Dillon is a 60 y.o. year old very pleasant female patient who presents to establish care- prior patient of Dr. Cato MulliganSwords and later Dr. Orvan Falconerampbell. Also sees gyn.   See problem oriented charting ROS- still with some hot flashes on low dose effexor, no chest pain or shortness of breath. No fever or unintentional weight loss. Has had som eweight gain.    Past Medical History-  Patient Active Problem List   Diagnosis Date Noted  . Hypertension 11/13/2005    Priority: Medium  . Hot flash, menopausal 06/29/2014    Priority: Low  . Allergic rhinitis 12/03/2009    Priority: Low  . Former smoker 12/28/2015    Medications- reviewed and updated Current Outpatient Prescriptions  Medication Sig Dispense Refill  . losartan (COZAAR) 100 MG tablet TAKE 1 TABLET DAILY 90 tablet 3  . MAGNESIUM CITRATE PO Take by mouth.    . venlafaxine XR (EFFEXOR-XR) 37.5 MG 24 hr capsule TAKE 1 CAPSULE DAILY WITH BREAKFAST 90 capsule 2   No current facility-administered medications for this visit.     Objective: BP 138/82   Pulse 84   Temp 98.3 F (36.8 C) (Oral)   Ht 5\' 1"  (1.549 m)   Wt 177 lb 12.8 oz (80.6 kg)   SpO2 93%   BMI 33.60 kg/m  Gen: NAD, resting comfortably CV: RRR no murmurs rubs or gallops Lungs: CTAB no crackles, wheeze, rhonchi Abdomen: soft/nontender/nondistended/normal bowel sounds. obese Ext: no edema Skin: warm, dry, no rash Neuro: grossly normal, moves all extremities  Assessment/Plan:  Hypertension S: controlled on losartan 100mg  per JNC8.  BP Readings from Last 3 Encounters:  12/27/15 138/82  02/22/15 118/80  12/25/14 118/82  A/P:Continue current meds:  We discussed newer guidelines and fact that [employer] is still endorsing JNC8- after discussion of benefit/risk wants to remain on current level of medication. In addition- prior #s have been improved- may have some increased stress today  Hot flash, menopausal S: hot flashes continued post menopause- effexor  has been helpful but very hard for her to wean off- feels extremely down and fatigued when trying to come off of medicine- though denies depressed mood A/P: discussed given role of caretakers for her parents would not recommend making adjustment at present- she is likely to continue for now   Other fatigue - S: Sees GYN for yearly physical including labs. TSH level down at times. Has fatigue, weight gain in waves but seems worse since about September. Denies depression. Just struggles to get through the day- exercise down.  A/P: unclear cause-  Plan: Comprehensive metabolic panel, CBC with Differential/Platelet, TSH, T4, free, T3, free, VITAMIN D 25 Hydroxy (Vit-D Deficiency, Fractures), Vitamin B12. Labs have come back largely normal at this point- may need graduated exercise program.   Orders Placed This Encounter  Procedures  . Comprehensive metabolic panel    [facility]  . CBC with Differential/Platelet  . TSH    [facility]  . T4, free    [place]  . T3, free  . VITAMIN D 25 Hydroxy (Vit-D Deficiency, Fractures)    [facility]  . Vitamin B12  . Hepatitis C antibody, reflex    [facility]    Meds ordered this encounter  Medications  . losartan (COZAAR) 100 MG tablet    Sig: TAKE 1 TABLET DAILY    Dispense:  90 tablet    Refill:  3   The duration of face-to-face time during this visit was greater than 25 minutes. Greater than 50%  of this time was spent in counseling about fatigue and caregiver burden, need to exercise and likely graduating exercise, discussing screening HCV HIV, upcoming shingrix. Plan was tdap but can give this next time as not given at time of visit.    Return precautions advised.  Tana ConchStephen Hunter, MD

## 2015-12-28 ENCOUNTER — Encounter: Payer: Self-pay | Admitting: Family Medicine

## 2015-12-28 DIAGNOSIS — Z87891 Personal history of nicotine dependence: Secondary | ICD-10-CM | POA: Insufficient documentation

## 2015-12-28 NOTE — Assessment & Plan Note (Signed)
S: controlled on losartan 100mg  per [facility].  BP Readings from Last 3 Encounters:  12/27/15 138/82  02/22/15 118/80  12/25/14 118/82  A/P:Continue current meds:  We discussed newer guidelines and fact that [employer] is still endorsing JNC8- after discussion of benefit/risk wants to remain on current level of medication. In addition- prior #s have been improved- may have some increased stress today

## 2015-12-28 NOTE — Assessment & Plan Note (Signed)
S: hot flashes continued post menopause- effexor has been helpful but very hard for her to wean off- feels extremely down and fatigued when trying to come off of medicine- though denies depressed mood A/P: discussed given role of caretakers for her parents would not recommend making adjustment at present- she is likely to continue for now

## 2016-01-02 ENCOUNTER — Telehealth: Payer: Self-pay | Admitting: Family Medicine

## 2016-01-02 NOTE — Telephone Encounter (Signed)
Spoke with patient. I let her know her labs were available to be viewed on MY Chart. I assisted her to be able to log in. She is going to look at her labs on there.

## 2016-01-02 NOTE — Telephone Encounter (Signed)
Pt would like you to call them concerning lab results °

## 2016-01-02 NOTE — Telephone Encounter (Signed)
Called patient and left a voicemail message on her home number and her cell number

## 2016-03-17 ENCOUNTER — Other Ambulatory Visit: Payer: Self-pay | Admitting: Family Medicine

## 2016-05-05 LAB — HM MAMMOGRAPHY

## 2016-05-14 ENCOUNTER — Telehealth: Payer: Self-pay | Admitting: Family Medicine

## 2016-05-14 NOTE — Telephone Encounter (Signed)
Can she come at 11:30 tomorrow or sometime in the afternoon so we can see her in the office? If she has worsening symptoms should seek care immediately such as urgent care overnight

## 2016-05-14 NOTE — Telephone Encounter (Signed)
° ° °  Pt said she is having high bp even when taking the below med. Would like a call back   losartan (COZAAR) 100 MG tablet     [phone] [phone]

## 2016-05-14 NOTE — Telephone Encounter (Signed)
Called and spoke with patient who reports that at her annual last week her BP was 160/100. As of this week it has gone as follows:  Tuesday: 148/90 Wednesday 154/87 Thursday 177/103 and she rechecked after taking Losartan and it was 186/91.  She states she has had a headache and been feeling sick to her stomach. She has been cancelling plans this week and left work early one day.  I encouraged her to continue to track her BP everyday at the same time and the same arm. Please advise.

## 2016-05-14 NOTE — Telephone Encounter (Signed)
Scheduled for 11:30am on 05/15/16

## 2016-05-15 ENCOUNTER — Encounter: Payer: Self-pay | Admitting: Family Medicine

## 2016-05-15 ENCOUNTER — Ambulatory Visit (INDEPENDENT_AMBULATORY_CARE_PROVIDER_SITE_OTHER): Payer: Commercial Managed Care - PPO | Admitting: Family Medicine

## 2016-05-15 DIAGNOSIS — I10 Essential (primary) hypertension: Secondary | ICD-10-CM

## 2016-05-15 MED ORDER — HYDROCHLOROTHIAZIDE 25 MG PO TABS: 25.0000 mg | ORAL_TABLET | Freq: Every day | ORAL | 5 refills | Status: DC

## 2016-05-15 NOTE — Progress Notes (Signed)
Subjective:  Dana DixonJoan M Dillon is a 61 y.o. year old very pleasant female patient who presents for/with See problem oriented charting ROS- No chest pain or shortness of breath. Has had some headaches but no blurry vision.  No facial or extremity weakness. No slurred words or trouble swallowing. no double vision. No paresthesias. No confusion or word finding difficulties.   Past Medical History-  Patient Active Problem List   Diagnosis Date Noted  . Hypertension 11/13/2005    Priority: Medium  . Former smoker 12/28/2015    Priority: Low  . Hot flash, menopausal 06/29/2014    Priority: Low  . Allergic rhinitis 12/03/2009    Priority: Low    Medications- reviewed and updated Current Outpatient Prescriptions  Medication Sig Dispense Refill  . losartan (COZAAR) 100 MG tablet TAKE 1 TABLET DAILY 90 tablet 3  . MAGNESIUM CITRATE PO Take by mouth.    . venlafaxine XR (EFFEXOR-XR) 37.5 MG 24 hr capsule TAKE 1 CAPSULE DAILY WITH BREAKFAST 90 capsule 2   No current facility-administered medications for this visit.     Objective: BP (!) 158/98   Pulse 77   Temp 98.3 F (36.8 C) (Oral)   Ht 5\' 1"  (1.549 m)   Wt 173 lb 3.2 oz (78.6 kg)   SpO2 98%   BMI 32.73 kg/m  Gen: NAD, resting comfortably CV: RRR no murmurs rubs or gallops Lungs: CTAB no crackles, wheeze, rhonchi Ext: no edema Skin: warm, dry Neuro: grossly normal, moves all extremities, normal gait  Assessment/Plan:  Hypertension S: controlled poorly on losartan 100mg . BP was trending up last visit- patient up 14 lbs from last year at last visit- though down 4 lbs this visit. sicne that time Home BPs often in 160s or above. Had checked at [facility] and was 160/100. Has had some headaches with this and nausea at times. States diet pretty clean at baselien- not exercisign like she should  BP Readings from Last 3 Encounters:  05/15/16 (!) 140/102  12/27/15 138/82  02/22/15 118/80  A/P:Continue current meds but add hctz 25 mg with 2  week follow up. bmet at that time. Add 150 minutes exercise a week and DASH eating plan (though sounds like diet is reasonable- see if improvements can be made. Discussed chlorthalidone likely ebtter but she would prefer combo pill losartan-hctz if able  Also she mentions some hot flashes- she is on a low dose effexor that she has not been able to come off of but we have tried reducing dose- likely cause. With intermittent nature of hot flashes and consistently high BP at home doubt something like pheochromocytoma.   Meds ordered this encounter  Medications  . hydrochlorothiazide (HYDRODIURIL) 25 MG tablet    Sig: Take 1 tablet (25 mg total) by mouth daily.    Dispense:  30 tablet    Refill:  5   Return precautions advised- she should seek care immediately for new or worsening symptoms Tana ConchStephen Latresa Gasser, MD

## 2016-05-15 NOTE — Progress Notes (Signed)
Pre visit review using our clinic review tool, if applicable. No additional management support is needed unless otherwise documented below in the visit note. 

## 2016-05-15 NOTE — Assessment & Plan Note (Signed)
S: controlled poorly on losartan 100mg . BP was trending up last visit- patient up 14 lbs from last year at last visit- though down 4 lbs this visit. sicne that time Home BPs often in 160s or above. Had checked at [facility] and was 160/100. Has had some headaches with this and nausea at times. States diet pretty clean at baselien- not exercisign like she should  BP Readings from Last 3 Encounters:  05/15/16 (!) 140/102  12/27/15 138/82  02/22/15 118/80  A/P:Continue current meds but add hctz 25 mg with 2 week follow up. bmet at that time. Add 150 minutes exercise a week and DASH eating plan (though sounds like diet is reasonable- see if improvements can be made. Discussed chlorthalidone likely ebtter but she would prefer combo pill losartan-hctz if able  Also she mentions some hot flashes- she is on a low dose effexor that she has not been able to come off of but we have tried reducing dose- likely cause. With intermittent nature of hot flashes and consistently high BP at home doubt something like pheochromocytoma.

## 2016-05-15 NOTE — Patient Instructions (Addendum)
Start hydrochlorothiazide tomorrow morning. Continue losartan.   Follow up in 2-3 weeks. Will update kidney and potassium test at that time. May consider thyroid test.   Would walk 30 minutes 5 days a week or other exercise   DASH Eating Plan DASH stands for "Dietary Approaches to Stop Hypertension." The DASH eating plan is a healthy eating plan that has been shown to reduce high blood pressure (hypertension). It may also reduce your risk for type 2 diabetes, heart disease, and stroke. The DASH eating plan may also help with weight loss. What are tips for following this plan? General guidelines   Avoid eating more than 2,300 mg (milligrams) of salt (sodium) a day. If you have hypertension, you may need to reduce your sodium intake to 1,500 mg a day.  Limit alcohol intake to no more than 1 drink a day for nonpregnant women and 2 drinks a day for men. One drink equals 12 oz of beer, 5 oz of wine, or 1 oz of hard liquor.  Work with your health care provider to maintain a healthy body weight or to lose weight. Ask what an ideal weight is for you.  Get at least 30 minutes of exercise that causes your heart to beat faster (aerobic exercise) most days of the week. Activities may include walking, swimming, or biking.  Work with your health care provider or diet and nutrition specialist (dietitian) to adjust your eating plan to your individual calorie needs. Reading food labels   Check food labels for the amount of sodium per serving. Choose foods with less than 5 percent of the Daily Value of sodium. Generally, foods with less than 300 mg of sodium per serving fit into this eating plan.  To find whole grains, look for the word "whole" as the first word in the ingredient list. Shopping   Buy products labeled as "low-sodium" or "no salt added."  Buy fresh foods. Avoid canned foods and premade or frozen meals. Cooking   Avoid adding salt when cooking. Use salt-free seasonings or herbs instead  of table salt or sea salt. Check with your health care provider or pharmacist before using salt substitutes.  Do not fry foods. Cook foods using healthy methods such as baking, boiling, grilling, and broiling instead.  Cook with heart-healthy oils, such as olive, canola, soybean, or sunflower oil. Meal planning    Eat a balanced diet that includes:  5 or more servings of fruits and vegetables each day. At each meal, try to fill half of your plate with fruits and vegetables.  Up to 6-8 servings of whole grains each day.  Less than 6 oz of lean meat, poultry, or fish each day. A 3-oz serving of meat is about the same size as a deck of cards. One egg equals 1 oz.  2 servings of low-fat dairy each day.  A serving of nuts, seeds, or beans 5 times each week.  Heart-healthy fats. Healthy fats called Omega-3 fatty acids are found in foods such as flaxseeds and coldwater fish, like sardines, salmon, and mackerel.  Limit how much you eat of the following:  Canned or prepackaged foods.  Food that is high in trans fat, such as fried foods.  Food that is high in saturated fat, such as fatty meat.  Sweets, desserts, sugary drinks, and other foods with added sugar.  Full-fat dairy products.  Do not salt foods before eating.  Try to eat at least 2 vegetarian meals each week.  Eat more home-cooked food  and less restaurant, buffet, and fast food.  When eating at a restaurant, ask that your food be prepared with less salt or no salt, if possible. What foods are recommended? The items listed may not be a complete list. Talk with your dietitian about what dietary choices are best for you. Grains  Whole-grain or whole-wheat bread. Whole-grain or whole-wheat pasta. Brown rice. Orpah Cobb. Bulgur. Whole-grain and low-sodium cereals. Pita bread. Low-fat, low-sodium crackers. Whole-wheat flour tortillas. Vegetables  Fresh or frozen vegetables (raw, steamed, roasted, or grilled). Low-sodium  or reduced-sodium tomato and vegetable juice. Low-sodium or reduced-sodium tomato sauce and tomato paste. Low-sodium or reduced-sodium canned vegetables. Fruits  All fresh, dried, or frozen fruit. Canned fruit in natural juice (without added sugar). Meat and other protein foods  Skinless chicken or [place]. Ground chicken or [place]. Pork with fat trimmed off. Fish and seafood. Egg whites. Dried beans, peas, or lentils. Unsalted nuts, nut butters, and seeds. Unsalted canned beans. Lean cuts of beef with fat trimmed off. Low-sodium, lean deli meat. Dairy  Low-fat (1%) or fat-free (skim) milk. Fat-free, low-fat, or reduced-fat cheeses. Nonfat, low-sodium ricotta or cottage cheese. Low-fat or nonfat yogurt. Low-fat, low-sodium cheese. Fats and oils  Soft margarine without trans fats. Vegetable oil. Low-fat, reduced-fat, or light mayonnaise and salad dressings (reduced-sodium). Canola, safflower, olive, soybean, and sunflower oils. Avocado. Seasoning and other foods  Herbs. Spices. Seasoning mixes without salt. Unsalted popcorn and pretzels. Fat-free sweets. What foods are not recommended? The items listed may not be a complete list. Talk with your dietitian about what dietary choices are best for you. Grains  Baked goods made with fat, such as croissants, muffins, or some breads. Dry pasta or rice meal packs. Vegetables  Creamed or fried vegetables. Vegetables in a cheese sauce. Regular canned vegetables (not low-sodium or reduced-sodium). Regular canned tomato sauce and paste (not low-sodium or reduced-sodium). Regular tomato and vegetable juice (not low-sodium or reduced-sodium). Rosita Fire. Olives. Fruits  Canned fruit in a light or heavy syrup. Fried fruit. Fruit in cream or butter sauce. Meat and other protein foods  Fatty cuts of meat. Ribs. Fried meat. Tomasa Blase. Sausage. Bologna and other processed lunch meats. Salami. Fatback. Hotdogs. Bratwurst. Salted nuts and seeds. Canned beans with added salt.  Canned or smoked fish. Whole eggs or egg yolks. Chicken or [place] with skin. Dairy  Whole or 2% milk, cream, and half-and-half. Whole or full-fat cream cheese. Whole-fat or sweetened yogurt. Full-fat cheese. Nondairy creamers. Whipped toppings. Processed cheese and cheese spreads. Fats and oils  Butter. Stick margarine. Lard. Shortening. Ghee. Bacon fat. Tropical oils, such as coconut, palm kernel, or palm oil. Seasoning and other foods  Salted popcorn and pretzels. Onion salt, garlic salt, seasoned salt, table salt, and sea salt. Worcestershire sauce. Tartar sauce. Barbecue sauce. Teriyaki sauce. Soy sauce, including reduced-sodium. Steak sauce. Canned and packaged gravies. Fish sauce. Oyster sauce. Cocktail sauce. Horseradish that you find on the shelf. Ketchup. Mustard. Meat flavorings and tenderizers. Bouillon cubes. Hot sauce and Tabasco sauce. Premade or packaged marinades. Premade or packaged taco seasonings. Relishes. Regular salad dressings. Where to find more information:  National Heart, Lung, and Blood Institute: PopSteam.is  American Heart Association: www.heart.org Summary  The DASH eating plan is a healthy eating plan that has been shown to reduce high blood pressure (hypertension). It may also reduce your risk for type 2 diabetes, heart disease, and stroke.  With the DASH eating plan, you should limit salt (sodium) intake to 2,300 mg a day. If you  have hypertension, you may need to reduce your sodium intake to 1,500 mg a day.  When on the DASH eating plan, aim to eat more fresh fruits and vegetables, whole grains, lean proteins, low-fat dairy, and heart-healthy fats.  Work with your health care provider or diet and nutrition specialist (dietitian) to adjust your eating plan to your individual calorie needs. This information is not intended to replace advice given to you by your health care provider. Make sure you discuss any questions you have with your health care  provider. Document Released: 12/18/2010 Document Revised: 12/23/2015 Document Reviewed: 12/23/2015 Elsevier Interactive Patient Education  2017 [employer].

## 2016-06-11 ENCOUNTER — Other Ambulatory Visit: Payer: Self-pay | Admitting: Family Medicine

## 2016-06-11 MED ORDER — HYDROCHLOROTHIAZIDE 25 MG PO TABS: 25.0000 mg | ORAL_TABLET | Freq: Every day | ORAL | 4 refills | Status: DC

## 2016-07-14 ENCOUNTER — Encounter: Payer: Self-pay | Admitting: Family Medicine

## 2016-07-14 ENCOUNTER — Ambulatory Visit (INDEPENDENT_AMBULATORY_CARE_PROVIDER_SITE_OTHER): Payer: Commercial Managed Care - PPO | Admitting: Family Medicine

## 2016-07-14 VITALS — BP 112/82 | HR 96 | Temp 98.5°F | Ht 61.0 in | Wt 172.6 lb

## 2016-07-14 DIAGNOSIS — I10 Essential (primary) hypertension: Secondary | ICD-10-CM | POA: Diagnosis not present

## 2016-07-14 DIAGNOSIS — Z23 Encounter for immunization: Secondary | ICD-10-CM | POA: Diagnosis not present

## 2016-07-14 LAB — BASIC METABOLIC PANEL
BUN: 23 mg/dL (ref 6–23)
CHLORIDE: 102 meq/L (ref 96–112)
CO2: 29 mEq/L (ref 19–32)
Calcium: 9.6 mg/dL (ref 8.4–10.5)
Creatinine, Ser: 0.8 mg/dL (ref 0.40–1.20)
GFR: 77.27 mL/min (ref >60.00)
Glucose, Bld: 113 mg/dL — ABNORMAL HIGH (ref 70–99)
POTASSIUM: 3.6 meq/L (ref 3.5–5.1)
SODIUM: 139 meq/L (ref 135–145)

## 2016-07-14 MED ORDER — LOSARTAN POTASSIUM-HCTZ 100-25 MG PO TABS: 1.0000 | ORAL_TABLET | Freq: Every day | ORAL | 3 refills | Status: DC

## 2016-07-14 NOTE — Progress Notes (Signed)
Subjective:  Dana DixonJoan M Mcadams is a 61 y.o. year old very pleasant female patient who presents for/with See problem oriented charting ROS- No chest pain or shortness of breath. No headache or blurry vision.  No edema. No increased urination   Past Medical History-  Patient Active Problem List   Diagnosis Date Noted  . Hypertension 11/13/2005    Priority: Medium  . Former smoker 12/28/2015    Priority: Low  . Hot flash, menopausal 06/29/2014    Priority: Low  . Allergic rhinitis 12/03/2009    Priority: Low    Medications- reviewed and updated Current Outpatient Prescriptions  Medication Sig Dispense Refill  . hydrochlorothiazide (HYDRODIURIL) 25 MG tablet Take 1 tablet (25 mg total) by mouth daily. 90 tablet 4  . losartan (COZAAR) 100 MG tablet TAKE 1 TABLET DAILY 90 tablet 3  . MAGNESIUM CITRATE PO Take by mouth.    . venlafaxine XR (EFFEXOR-XR) 37.5 MG 24 hr capsule TAKE 1 CAPSULE DAILY WITH BREAKFAST 90 capsule 2   No current facility-administered medications for this visit.     Objective: BP 112/82 (BP Location: Left Arm, Patient Position: Sitting, Cuff Size: Large)   Pulse 96   Temp 98.5 F (36.9 C) (Oral)   Ht 5\' 1"  (1.549 m)   Wt 172 lb 9.6 oz (78.3 kg)   SpO2 94%   BMI 32.61 kg/m  Gen: NAD, resting comfortably CV: RRR no murmurs rubs or gallops Lungs: CTAB no crackles, wheeze, rhonchi Abdomen: soft/nontender/nondistended/normal bowel sounds.  Ext: no edema Skin: warm, dry  Assessment/Plan:  Hypertension S: controlled on losartan 100mg  with addition of hctz 25mg  at last visit. Also has lost another lb- last visit discussed 150 minutes exercise a week and dash eating plan ASCVD 10 year risk calculation if age 62-79: need to get records from GYN as has not had these drawn here BP Readings from Last 3 Encounters:  07/14/16 112/82  05/15/16 (!) 158/98  12/27/15 138/82  A/P: We discussed blood pressure goal of <140/90. Continue current meds: change to combo pill.  Update bmet for Cr and potassium.  Admits needs to drink more h20- will keep this in mind if creatinine increases Return in about 6 months (around 01/14/2017) for follow up- or sooner if needed.  Orders Placed This Encounter  Procedures  . Tdap vaccine greater than or equal to 7yo IM  . Basic metabolic panel    [place]    Meds ordered this encounter  Medications  . losartan-hydrochlorothiazide (HYZAAR) 100-25 MG tablet    Sig: Take 1 tablet by mouth daily.    Dispense:  90 tablet    Refill:  3   The duration of face-to-face time during this visit was greater than 15 minutes. Greater than 50% of this time was spent in counseling, explanation of diagnosis, planning of further management, and/or coordination of care including discussion of BP goals, need to add exercise, need to remain well hydrated, consolidation discussion for BP meds.   Return precautions advised.  Tana ConchStephen Carver Murakami, MD

## 2016-07-14 NOTE — Patient Instructions (Addendum)
Please stop by lab before you go  Once you run out of losartan 100mg  and hctz 25 mg- start combo pill that we sent to mail order.   Start your regular exercise back up. Still happy with 1 lb down  At check out 1. Sign release of information at the check out desk for labs from gynecology as well as your mammogram 2.  Sign release of information at the check out desk [facility] as [facility]

## 2016-07-14 NOTE — Assessment & Plan Note (Signed)
S: controlled on losartan 100mg  with addition of hctz 25mg  at last visit. Also has lost another lb- last visit discussed 150 minutes exercise a week and dash eating plan ASCVD 10 year risk calculation if age 62-79: need to get records from GYN as has not had these drawn here BP Readings from Last 3 Encounters:  07/14/16 112/82  05/15/16 (!) 158/98  12/27/15 138/82  A/P: We discussed blood pressure goal of <140/90. Continue current meds: change to combo pill. Update bmet for Cr and potassium.

## 2016-07-29 ENCOUNTER — Encounter: Payer: Self-pay | Admitting: Family Medicine

## 2016-08-04 ENCOUNTER — Encounter: Payer: Self-pay | Admitting: Family Medicine

## 2016-12-11 ENCOUNTER — Ambulatory Visit: Payer: Commercial Managed Care - PPO | Admitting: Family Medicine

## 2016-12-11 ENCOUNTER — Encounter: Payer: Self-pay | Admitting: Family Medicine

## 2016-12-11 VITALS — BP 142/92 | HR 79 | Temp 97.8°F | Ht 61.0 in | Wt 173.2 lb

## 2016-12-11 DIAGNOSIS — R200 Anesthesia of skin: Secondary | ICD-10-CM | POA: Diagnosis not present

## 2016-12-11 DIAGNOSIS — M79604 Pain in right leg: Secondary | ICD-10-CM | POA: Diagnosis not present

## 2016-12-11 DIAGNOSIS — I10 Essential (primary) hypertension: Secondary | ICD-10-CM | POA: Diagnosis not present

## 2016-12-11 DIAGNOSIS — M79605 Pain in left leg: Secondary | ICD-10-CM | POA: Diagnosis not present

## 2016-12-11 DIAGNOSIS — Z636 Dependent relative needing care at home: Secondary | ICD-10-CM | POA: Diagnosis not present

## 2016-12-11 LAB — BASIC METABOLIC PANEL
BUN: 16 mg/dL (ref 6–23)
CHLORIDE: 101 meq/L (ref 96–112)
CO2: 29 mEq/L (ref 19–32)
Calcium: 9.6 mg/dL (ref 8.4–10.5)
Creatinine, Ser: 0.86 mg/dL (ref 0.40–1.20)
GFR: 70.99 mL/min (ref >60.00)
GLUCOSE: 98 mg/dL (ref 70–99)
POTASSIUM: 3.7 meq/L (ref 3.5–5.1)
Sodium: 137 mEq/L (ref 135–145)

## 2016-12-11 LAB — CBC
HEMATOCRIT: 40.2 % (ref 36.0–46.0)
HEMOGLOBIN: 13.2 g/dL (ref 12.0–15.0)
MCHC: 32.9 g/dL (ref 30.0–36.0)
MCV: 89.6 fl (ref 78.0–100.0)
Platelets: 196 10*3/uL (ref 150.0–400.0)
RBC: 4.48 Mil/uL (ref 3.87–5.11)
RDW: 13.7 % (ref 11.5–15.5)
WBC: 6 10*3/uL (ref 4.0–10.5)

## 2016-12-11 LAB — VITAMIN B12: Vitamin B-12: >1500 pg/mL — ABNORMAL HIGH (ref 211–911)

## 2016-12-11 LAB — MAGNESIUM: Magnesium: 2.2 mg/dL (ref 1.5–2.5)

## 2016-12-11 MED ORDER — PREDNISONE 20 MG PO TABS: ORAL_TABLET | ORAL | 0 refills | Status: DC

## 2016-12-11 MED ORDER — BUSPIRONE HCL 5 MG PO TABS: 5.0000 mg | ORAL_TABLET | Freq: Two times a day (BID) | ORAL | 1 refills | Status: DC | PRN

## 2016-12-11 NOTE — Patient Instructions (Addendum)
Please stop by lab before you go- see if any cause in labs  Doubt your blood pressure medicine is causing this- take when you get home. We could always try a few days off in future if other days do not help.   Try 7 days of prednisone- hopeful significant improvement. Can follow up with [place] [facility] if needed.   Trial buspar as needed for anxiety/stress. Also consider a few sessions with behavioral health if you could find the time- just call the #

## 2016-12-11 NOTE — Progress Notes (Signed)
Subjective:  Josie DixonJoan M Mahan is a 61 y.o. year old very pleasant female patient who presents for/with See problem oriented charting ROS- No chest pain or shortness of breath. No headache or blurry vision. No fecal or urinary incontinence. Some leg numbness and tingling but no weakness   Past Medical History-  Patient Active Problem List   Diagnosis Date Noted  . Hypertension 11/13/2005    Priority: Medium  . Former smoker 12/28/2015    Priority: Low  . Hot flash, menopausal 06/29/2014    Priority: Low  . Allergic rhinitis 12/03/2009    Priority: Low  . Caregiver burden 12/11/2016    Medications- reviewed and updated Current Outpatient Medications  Medication Sig Dispense Refill  . losartan-hydrochlorothiazide (HYZAAR) 100-25 MG tablet Take 1 tablet by mouth daily. 90 tablet 3  . MAGNESIUM CITRATE PO Take by mouth.     No current facility-administered medications for this visit.     Objective: BP (!) 142/92 (BP Location: Left Arm, Patient Position: Sitting, Cuff Size: Large)   Pulse 79   Temp 97.8 F (36.6 C) (Oral)   Ht 5\' 1"  (1.549 m)   Wt 173 lb 3.2 oz (78.6 kg)   SpO2 98%   BMI 32.73 kg/m  Gen: NAD, resting comfortably CV: RRR no murmurs rubs or gallops Lungs: CTAB no crackles, wheeze, rhonchi Ext: no edema Skin: warm, dry  Back - Normal skin, Spine with normal alignment and no deformity.  No tenderness to vertebral process palpation.  Paraspinous muscles are not tender and without spasm.   Range of motion is full at neck and lumbar sacral regions. Negative Straight leg raise.  Neuro- no saddle anesthesia, 5/5 strength lower extremities, 2+ reflexes  Assessment/Plan:  Bilateral leg pain - Plan: Vitamin B12, Basic metabolic panel, CBC, Magnesium Numbness in both legs - Plan: Vitamin B12, Basic metabolic panel, CBC, Magnesium S: Blood pressurecontrolled on losartan 100mg  with hctz 25mg  last visit- we changed her to combo pill at that time. She stopped medicine thinking  it was causing leg symptoms.   Since October or September she has had bilateral leg pain particularly calf down.  At night gets charly horses in both legs, cramping in her feet as well. Some plantar fasciitis/heel pain in left heel over last few days. Takes magnesium as well at baseline for bowels. States has done much better with drinking water regularly. Pain right now 3/10, at its worst up 8/10. Has knocked her out of exercising and prior had lost weight. Denies worse with activity. Seems to be worse each Am.   Also has some pain in lower back but this is long term intermittent issue. Also has some tingling and numbness into the lower legs. Has had sciatica in the past years ago- cannot recall if had numbness/tingling.  A/P:  I discussed with patient I was concerned this was lumbar in origin even with negative straight leg raise. Trial 7 days of prednisone. Also check mg, b12, bmp. Continue BP meds. Follow up with [place] [facility] if not improved  Hypertension BP Readings from Last 3 Encounters:  12/11/16 (!) 142/92  07/14/16 112/82  05/15/16 (!) 158/98  BP poorly controlled today as did not take losartan hct 100-25mg  concerned it was causing her cramping- cramping persists. Discussed not likely cause. Encouraged her to take BP meds today- will see if electrolyte issue form BP meds though doubt. Did discuss could try a few days off in future if other efforts fail to improve leg symptoms  Caregiver  burden Cares for 2 parents over age 62 and husband has bipolar trying to work to get on right meds. Off effexor for hot flashes as raised BP. She wants to have something prn for anxiety/stress. Agreed to try buspirone 5mg - if any availability issues could try 7.5 or even 10mg  dose. She does not want to try something daily like zoloft. Discussed avoid benzodiazepines if possible.    No future appointments. No Follow-up on file.  Orders Placed This Encounter  Procedures  . Vitamin B12  . Basic metabolic  panel    [place]  . CBC      . Magnesium    Meds ordered this encounter  Medications  . busPIRone (BUSPAR) 5 MG tablet    Sig: Take 1 tablet (5 mg total) by mouth 2 (two) times daily as needed.    Dispense:  60 tablet    Refill:  1  . predniSONE (DELTASONE) 20 MG tablet    Sig: Take 2 pills for 3 days, 1 pill for 4 days    Dispense:  10 tablet    Refill:  0    Return precautions advised.  Tana ConchStephen Warner Laduca, MD

## 2016-12-11 NOTE — Assessment & Plan Note (Signed)
BP Readings from Last 3 Encounters:  12/11/16 (!) 142/92  07/14/16 112/82  05/15/16 (!) 158/98  BP poorly controlled today as did not take losartan hct 100-25mg  concerned it was causing her cramping- cramping persists. Discussed not likely cause. Encouraged her to take BP meds today- will see if electrolyte issue form BP meds though doubt. Did discuss could try a few days off in future if other efforts fail to improve leg symptoms

## 2016-12-11 NOTE — Assessment & Plan Note (Signed)
Cares for 2 parents over age 62 and husband has bipolar trying to work to get on right meds. Off effexor for hot flashes as raised BP. She wants to have something prn for anxiety/stress. Agreed to try buspirone 5mg - if any availability issues could try 7.5 or even 10mg  dose. She does not want to try something daily like zoloft. Discussed avoid benzodiazepines if possible.

## 2017-02-18 DIAGNOSIS — M6702 Short Achilles tendon (acquired), left ankle: Secondary | ICD-10-CM | POA: Diagnosis not present

## 2017-02-18 DIAGNOSIS — M722 Plantar fascial fibromatosis: Secondary | ICD-10-CM | POA: Diagnosis not present

## 2017-05-10 DIAGNOSIS — Z1231 Encounter for screening mammogram for malignant neoplasm of breast: Secondary | ICD-10-CM | POA: Diagnosis not present

## 2017-05-10 DIAGNOSIS — Z01419 Encounter for gynecological examination (general) (routine) without abnormal findings: Secondary | ICD-10-CM | POA: Diagnosis not present

## 2017-05-10 DIAGNOSIS — Z6832 Body mass index (BMI) 32.0-32.9, adult: Secondary | ICD-10-CM | POA: Diagnosis not present

## 2017-05-10 DIAGNOSIS — Z13820 Encounter for screening for osteoporosis: Secondary | ICD-10-CM | POA: Diagnosis not present

## 2017-05-10 LAB — RESULTS CONSOLE HPV: CHL HPV: NEGATIVE

## 2017-05-10 LAB — HM PAP SMEAR: HM Pap smear: NEGATIVE

## 2017-05-14 DIAGNOSIS — M6702 Short Achilles tendon (acquired), left ankle: Secondary | ICD-10-CM | POA: Diagnosis not present

## 2017-05-14 DIAGNOSIS — M722 Plantar fascial fibromatosis: Secondary | ICD-10-CM | POA: Insufficient documentation

## 2017-05-26 DIAGNOSIS — Z1329 Encounter for screening for other suspected endocrine disorder: Secondary | ICD-10-CM | POA: Diagnosis not present

## 2017-05-26 DIAGNOSIS — Z13228 Encounter for screening for other metabolic disorders: Secondary | ICD-10-CM | POA: Diagnosis not present

## 2017-05-26 DIAGNOSIS — Z800 Family history of malignant neoplasm of digestive organs: Secondary | ICD-10-CM | POA: Diagnosis not present

## 2017-05-26 DIAGNOSIS — Z803 Family history of malignant neoplasm of breast: Secondary | ICD-10-CM | POA: Diagnosis not present

## 2017-05-26 DIAGNOSIS — Z13220 Encounter for screening for lipoid disorders: Secondary | ICD-10-CM | POA: Diagnosis not present

## 2017-05-28 LAB — BASIC METABOLIC PANEL
Creatinine: 0.7 (ref <=1.1)
Glucose: 89

## 2017-05-28 LAB — LIPID PANEL
Cholesterol: 209 — AB (ref 0–200)
HDL: 62 (ref 35–70)
LDL Cholesterol: 125

## 2017-07-02 DIAGNOSIS — M722 Plantar fascial fibromatosis: Secondary | ICD-10-CM | POA: Diagnosis not present

## 2017-07-08 ENCOUNTER — Encounter: Payer: Self-pay | Admitting: Family Medicine

## 2017-07-08 DIAGNOSIS — Z809 Family history of malignant neoplasm, unspecified: Secondary | ICD-10-CM | POA: Diagnosis not present

## 2017-07-08 DIAGNOSIS — E039 Hypothyroidism, unspecified: Secondary | ICD-10-CM | POA: Diagnosis not present

## 2017-07-14 ENCOUNTER — Other Ambulatory Visit: Payer: Self-pay | Admitting: Obstetrics and Gynecology

## 2017-07-14 DIAGNOSIS — R945 Abnormal results of liver function studies: Principal | ICD-10-CM

## 2017-07-14 DIAGNOSIS — R7989 Other specified abnormal findings of blood chemistry: Secondary | ICD-10-CM

## 2017-07-16 ENCOUNTER — Other Ambulatory Visit: Payer: Self-pay | Admitting: Family Medicine

## 2017-07-16 DIAGNOSIS — M722 Plantar fascial fibromatosis: Secondary | ICD-10-CM | POA: Diagnosis not present

## 2017-07-21 DIAGNOSIS — M722 Plantar fascial fibromatosis: Secondary | ICD-10-CM | POA: Diagnosis not present

## 2017-07-23 ENCOUNTER — Ambulatory Visit
Admission: RE | Admit: 2017-07-23 | Discharge: 2017-07-23 | Disposition: A | Discharge status: Home / Self Care | Payer: Commercial Managed Care - PPO | Source: Ambulatory Visit | Attending: Obstetrics and Gynecology | Admitting: Obstetrics and Gynecology

## 2017-07-23 DIAGNOSIS — R7989 Other specified abnormal findings of blood chemistry: Secondary | ICD-10-CM

## 2017-07-23 DIAGNOSIS — R945 Abnormal results of liver function studies: Principal | ICD-10-CM

## 2017-08-06 DIAGNOSIS — M722 Plantar fascial fibromatosis: Secondary | ICD-10-CM | POA: Diagnosis not present

## 2017-08-06 DIAGNOSIS — M6702 Short Achilles tendon (acquired), left ankle: Secondary | ICD-10-CM | POA: Diagnosis not present

## 2017-09-23 ENCOUNTER — Other Ambulatory Visit: Payer: Self-pay | Admitting: Family Medicine

## 2018-01-27 DIAGNOSIS — R748 Abnormal levels of other serum enzymes: Secondary | ICD-10-CM | POA: Diagnosis not present

## 2018-01-27 LAB — HEPATIC FUNCTION PANEL
ALT: 44 — AB (ref 7–35)
AST: 26 (ref 13–35)
Alkaline Phosphatase: 50 (ref 25–125)
Bilirubin, Direct: 0.16 (ref 0.01–0.4)
Bilirubin, Total: 0.6

## 2018-01-27 LAB — HM HEPATITIS C SCREENING LAB: HM Hepatitis Screen: NEGATIVE

## 2018-01-27 LAB — TSH: TSH: 2.3 (ref 0.41–5.90)

## 2018-01-27 LAB — LIPID PANEL: Triglycerides: 108 (ref 40–160)

## 2018-04-21 ENCOUNTER — Telehealth: Payer: Self-pay

## 2018-04-21 NOTE — Telephone Encounter (Signed)
Left detailed message for pt to return phone call. Pt has not been seen since 2018 was calling to see if she could schedule a virtual visit.

## 2018-05-10 ENCOUNTER — Telehealth: Payer: Self-pay

## 2018-05-10 NOTE — Telephone Encounter (Signed)
Can you go ahead and start working on getting copy of mammogram and pap smear?

## 2018-05-10 NOTE — Telephone Encounter (Signed)
When I spoke to pt earlier she said she already requested this. Pt was advise to call to have these sent to [place].

## 2018-05-10 NOTE — Telephone Encounter (Signed)
-----   Message from Shelva Majestic, MD sent at 05/10/2018  7:52 AM EDT ----- Convert to phone call. Need a copy of patient's last mammogram and pap- can we touch base with patient? Have her call GYN to allow [place] to get a copy of these- or at least allow [place] verbally to request it?

## 2018-05-10 NOTE — Telephone Encounter (Signed)
Spoke to pt and she has been scheduled. No further action needed!

## 2018-05-12 ENCOUNTER — Encounter: Payer: Self-pay | Admitting: Family Medicine

## 2018-05-12 ENCOUNTER — Ambulatory Visit (INDEPENDENT_AMBULATORY_CARE_PROVIDER_SITE_OTHER): Payer: Commercial Managed Care - PPO | Admitting: Family Medicine

## 2018-05-12 VITALS — BP 124/72 | Temp 98.3°F | Ht 61.0 in | Wt 162.5 lb

## 2018-05-12 DIAGNOSIS — E039 Hypothyroidism, unspecified: Secondary | ICD-10-CM | POA: Diagnosis not present

## 2018-05-12 DIAGNOSIS — E669 Obesity, unspecified: Secondary | ICD-10-CM

## 2018-05-12 DIAGNOSIS — I10 Essential (primary) hypertension: Secondary | ICD-10-CM

## 2018-05-12 DIAGNOSIS — F419 Anxiety disorder, unspecified: Secondary | ICD-10-CM | POA: Diagnosis not present

## 2018-05-12 MED ORDER — LOSARTAN POTASSIUM-HCTZ 100-25 MG PO TABS: 1.0000 | ORAL_TABLET | Freq: Every day | ORAL | 3 refills | Status: DC

## 2018-05-12 MED ORDER — BUSPIRONE HCL 5 MG PO TABS: 5.0000 mg | ORAL_TABLET | Freq: Two times a day (BID) | ORAL | 3 refills | Status: DC | PRN

## 2018-05-12 NOTE — Progress Notes (Signed)
Phone [phone]   Subjective:  Virtual visit via Video note. Chief complaint: Chief Complaint  Patient presents with  . Follow-up    This visit type was conducted due to national recommendations for restrictions regarding the COVID-19 Pandemic (e.g. social distancing).  This format is felt to be most appropriate for this patient at this time balancing risks to patient and risks to population by having him in for in person visit.  No physical exam was performed (except for noted visual exam or audio findings with Telehealth visits).    Our team/I connected with Dana GulaJoan M Dillon on 05/12/18 at  1:20 PM EDT by a video enabled telemedicine application ([email or web address]) and verified that I am speaking with the correct person using two identifiers.  Location patient: [place] Location provider: [facility], office Persons participating in the virtual visit: patient  Our team/I discussed the limitations of evaluation and management by telemedicine and the availability of in person appointments. In light of current covid-19 pandemic, patient also understands that we are trying to protect them by minimizing in office contact if at all possible.  The patient expressed consent for telemedicine visit and agreed to proceed. Patient understands insurance will be billed.   ROS- No chest pain or shortness of breath. No headache or blurry vision.  No fever/chills.  Still gets some hot flahses at times  Past Medical History-  Patient Active Problem List   Diagnosis Date Noted  . Caregiver burden 12/11/2016    Priority: High  . Anxiety 05/12/2018    Priority: Medium  . Hypothyroidism 05/12/2018    Priority: Medium  . Hypertension 11/13/2005    Priority: Medium  . Former smoker 12/28/2015    Priority: Low  . Hot flash, menopausal 06/29/2014    Priority: Low  . Allergic rhinitis 12/03/2009    Priority: Low    Medications- reviewed and updated Current Outpatient Medications  Medication Sig Dispense  Refill  . busPIRone (BUSPAR) 5 MG tablet Take 1 tablet (5 mg total) by mouth 2 (two) times daily as needed. 180 tablet 3  . Cholecalciferol (VITAMIN D3 PO) Take 5,000 Units by mouth daily.    Marland Kitchen. levothyroxine (SYNTHROID) 50 MCG tablet levothyroxine 50 mcg tablet    . losartan-hydrochlorothiazide (HYZAAR) 100-25 MG tablet Take 1 tablet by mouth daily. 90 tablet 3  . MAGNESIUM CITRATE PO Take by mouth.     No current facility-administered medications for this visit.      Objective:  BP 124/72   Temp 98.3 F (36.8 C) (Oral)   Ht 5\' 1"  (1.549 m)   Wt 162 lb 8 oz (73.7 kg)   BMI 30.70 kg/m  Gen: NAD, resting comfortably Lungs: nonlabored, normal respiratory rate  Skin: appears dry, no obvious rash    Assessment and Plan   #hypertension S: controlled on  Losartan hctz 100-25 mg BP Readings from Last 3 Encounters:  05/12/18 124/72  12/11/16 (!) 142/92  07/14/16 112/82  A/P: Stable. Continue current medications.  Had labs with GYN- she is going to get them to send us a copy  # Anxiety S:has not taken buspirone in a while as ran out- was helpful when she took it- mainly took once a day  phq9 of 5 due to covid 19- states getting down due to quarantine. Counseling provided today.  A/P: we are going to write a new rx so she has enough for ongoing use if needed up to twice a day but glad once a day has been helpful and  she can continue that.    #Obesity S: Patient has been training right direction with weight-Got to 154-  Trending back up with covid 19 isolation A/P: encouraged patient to restart prior exercise. She thinks is doing reasonable job on diet.    #Hypothyroidism S: Dr. Arelia Sneddon gyn providing rx- started her on levothyroxine due to underactive thyroid A/P: patient is going to get [place] a copy of most recent labs- suspect well controlled due to close f/u with GYN   No future appointments. No follow-ups on file.  Lab/Order associations: Essential hypertension  Anxiety   Hypothyroidism, unspecified type  Obesity (BMI 30.0-34.9)  Meds ordered this encounter  Medications  . busPIRone (BUSPAR) 5 MG tablet    Sig: Take 1 tablet (5 mg total) by mouth 2 (two) times daily as needed.    Dispense:  180 tablet    Refill:  3  . losartan-hydrochlorothiazide (HYZAAR) 100-25 MG tablet    Sig: Take 1 tablet by mouth daily.    Dispense:  90 tablet    Refill:  3   Return precautions advised.  Tana Conch, MD

## 2018-05-12 NOTE — Patient Instructions (Addendum)
Health Maintenance Due  Topic Date Due  . PAP SMEAR-Modifier  03/27/2017  . MAMMOGRAM  05/06/2018  Both to be faxed to [place] along with labs- will check in 90 days whether we received  Video visit

## 2018-05-12 NOTE — Assessment & Plan Note (Signed)
S:has not taken buspirone in a while as ran out- was helpful when she took it- mainly took once a day  phq9 of 5 due to covid 19- states getting down due to quarantine. Counseling provided today.  A/P: we are going to write a new rx so she has enough for ongoing use if needed up to twice a day but glad once a day has been helpful and she can continue that.

## 2018-05-12 NOTE — Assessment & Plan Note (Signed)
S: Dr. Arelia Sneddon gyn providing rx- started her on levothyroxine due to underactive thyroid A/P: patient is going to get [place] a copy of most recent labs- suspect well controlled due to close f/u with GYN

## 2018-08-16 LAB — HM MAMMOGRAPHY

## 2018-08-26 ENCOUNTER — Encounter: Payer: Self-pay | Admitting: Family Medicine

## 2018-09-07 ENCOUNTER — Telehealth: Payer: Self-pay

## 2018-09-07 DIAGNOSIS — E039 Hypothyroidism, unspecified: Secondary | ICD-10-CM

## 2018-09-07 DIAGNOSIS — I10 Essential (primary) hypertension: Secondary | ICD-10-CM

## 2018-09-07 NOTE — Telephone Encounter (Signed)
Copied from [facility] [id number]. Topic: Appointment Scheduling - Scheduling Inquiry for Clinic >> Sep 07, 2018  1:00 PM Scherrie Gerlach wrote: Reason for CRM: pt states she saw her OB Dr Radene Knee today and he is reccomending pt has labs (tsh, liver function, lipid, and any other labs dr hunter thinks she should have)  pt having thyroid issues and med may need adjusting.  Pt states her insurance will pay for labs through her pcp, so she would like to get here at this office, if dr hunter will order.

## 2018-09-07 NOTE — Addendum Note (Signed)
Addended by: Marin Olp on: 09/07/2018 03:03 PM   Modules accepted: Orders

## 2018-09-07 NOTE — Telephone Encounter (Signed)
Labs ordered-depending on results may need virtual visit afterwards.

## 2018-09-07 NOTE — Telephone Encounter (Signed)
Last OV 05/12/18  OK to place lab orders or does pt need appt?

## 2018-09-07 NOTE — Telephone Encounter (Signed)
Called pt and advised. Lab visit scheduled for 09/08/18.

## 2018-09-08 ENCOUNTER — Other Ambulatory Visit (INDEPENDENT_AMBULATORY_CARE_PROVIDER_SITE_OTHER): Payer: Commercial Managed Care - PPO

## 2018-09-08 ENCOUNTER — Other Ambulatory Visit: Payer: Self-pay

## 2018-09-08 DIAGNOSIS — I10 Essential (primary) hypertension: Secondary | ICD-10-CM

## 2018-09-08 DIAGNOSIS — E039 Hypothyroidism, unspecified: Secondary | ICD-10-CM

## 2018-09-08 LAB — COMPREHENSIVE METABOLIC PANEL
ALT: 22 U/L (ref 0–35)
AST: 20 U/L (ref 0–37)
Albumin: 4.4 g/dL (ref 3.5–5.2)
Alkaline Phosphatase: 55 U/L (ref 39–117)
BUN: 31 mg/dL — ABNORMAL HIGH (ref 6–23)
CO2: 28 mEq/L (ref 19–32)
Calcium: 9.4 mg/dL (ref 8.4–10.5)
Chloride: 99 mEq/L (ref 96–112)
Creatinine, Ser: 0.82 mg/dL (ref 0.40–1.20)
GFR: 70.17 mL/min (ref >60.00)
Glucose, Bld: 108 mg/dL — ABNORMAL HIGH (ref 70–99)
Potassium: 3.7 mEq/L (ref 3.5–5.1)
Sodium: 136 mEq/L (ref 135–145)
Total Bilirubin: 0.7 mg/dL (ref 0.2–1.2)
Total Protein: 6.9 g/dL (ref 6.0–8.3)

## 2018-09-08 LAB — CBC
HCT: 37.6 % (ref 36.0–46.0)
Hemoglobin: 12.6 g/dL (ref 12.0–15.0)
MCHC: 33.5 g/dL (ref 30.0–36.0)
MCV: 87.7 fl (ref 78.0–100.0)
Platelets: 208 10*3/uL (ref 150.0–400.0)
RBC: 4.28 Mil/uL (ref 3.87–5.11)
RDW: 13.6 % (ref 11.5–15.5)
WBC: 7.2 10*3/uL (ref 4.0–10.5)

## 2018-09-08 LAB — LIPID PANEL
Cholesterol: 242 mg/dL — ABNORMAL HIGH (ref 0–200)
HDL: 63.8 mg/dL (ref >39.00)
LDL Cholesterol: 153 mg/dL — ABNORMAL HIGH (ref 0–99)
NonHDL: 177.91
Total CHOL/HDL Ratio: 4
Triglycerides: 127 mg/dL (ref 0.0–149.0)
VLDL: 25.4 mg/dL (ref 0.0–40.0)

## 2018-09-08 LAB — TSH: TSH: 2.04 u[IU]/mL (ref 0.35–4.50)

## 2019-01-10 ENCOUNTER — Ambulatory Visit: Payer: Commercial Managed Care - PPO | Attending: Internal Medicine

## 2019-01-10 DIAGNOSIS — Z20822 Contact with and (suspected) exposure to covid-19: Secondary | ICD-10-CM

## 2019-01-10 DIAGNOSIS — Z20828 Contact with and (suspected) exposure to other viral communicable diseases: Secondary | ICD-10-CM | POA: Insufficient documentation

## 2019-01-12 LAB — NOVEL CORONAVIRUS, NAA: SARS-CoV-2, NAA: NOT DETECTED

## 2019-02-10 DIAGNOSIS — M67979 Unspecified disorder of synovium and tendon, unspecified ankle and foot: Secondary | ICD-10-CM | POA: Insufficient documentation

## 2019-04-29 ENCOUNTER — Ambulatory Visit: Payer: Self-pay | Attending: Internal Medicine

## 2019-04-29 DIAGNOSIS — Z23 Encounter for immunization: Secondary | ICD-10-CM

## 2019-04-29 NOTE — Progress Notes (Signed)
   Covid-19 Vaccination Clinic  Name:  Dana Dillon    MRN: [id number] DOB: 11-Jan-1956  04/29/2019  Dana Dillon was observed post Covid-19 immunization for 15 minutes without incident. She was provided with Vaccine Information Sheet and instruction to access the V-Safe system.   Dana Dillon was instructed to call 911 with any severe reactions post vaccine: Marland Kitchen Difficulty breathing  . Swelling of face and throat  . A fast heartbeat  . A bad rash all over body  . Dizziness and weakness   Immunizations Administered    Name Date Dose VIS Date Route   Pfizer COVID-19 Vaccine 04/29/2019 12:42 PM 0.3 mL 12/23/2018 Intramuscular   Manufacturer: [employer], [employer]   Lot: [id number]   NDC: [address]-5

## 2019-05-22 ENCOUNTER — Other Ambulatory Visit: Payer: Self-pay | Admitting: Family Medicine

## 2019-05-22 ENCOUNTER — Ambulatory Visit: Payer: Self-pay | Attending: Internal Medicine

## 2019-05-22 DIAGNOSIS — Z23 Encounter for immunization: Secondary | ICD-10-CM

## 2019-05-22 NOTE — Progress Notes (Signed)
   Covid-19 Vaccination Clinic  Name:  MIANNA IEZZI    MRN: [id number] DOB: 1955/03/16  05/22/2019  Ms. Lippert was observed post Covid-19 immunization for 15 minutes without incident. She was provided with Vaccine Information Sheet and instruction to access the V-Safe system.   Ms. Hackmann was instructed to call 911 with any severe reactions post vaccine: Marland Kitchen Difficulty breathing  . Swelling of face and throat  . A fast heartbeat  . A bad rash all over body  . Dizziness and weakness   Immunizations Administered    Name Date Dose VIS Date Route   Pfizer COVID-19 Vaccine 05/22/2019 12:38 PM 0.3 mL 03/08/2018 Intramuscular   Manufacturer: [employer], [employer]   Lot: `   NDC: [phone]

## 2019-06-18 ENCOUNTER — Other Ambulatory Visit: Payer: Self-pay | Admitting: Family Medicine

## 2019-06-20 ENCOUNTER — Other Ambulatory Visit: Payer: Self-pay | Admitting: Family Medicine

## 2019-07-20 ENCOUNTER — Telehealth: Payer: Self-pay

## 2019-07-20 MED ORDER — LOSARTAN POTASSIUM-HCTZ 100-25 MG PO TABS: 1.0000 | ORAL_TABLET | Freq: Every day | ORAL | 3 refills | Status: DC

## 2019-07-20 MED ORDER — BUSPIRONE HCL 5 MG PO TABS: 5.0000 mg | ORAL_TABLET | Freq: Two times a day (BID) | ORAL | 3 refills | Status: DC | PRN

## 2019-07-20 MED ORDER — LEVOTHYROXINE SODIUM 50 MCG PO TABS: ORAL_TABLET | ORAL | 3 refills | Status: DC

## 2019-07-20 NOTE — Telephone Encounter (Signed)
Pt called stating she has gone on medicare and would like her prescriptions sent to [facility].

## 2019-07-20 NOTE — Telephone Encounter (Signed)
Rxs sent to [facility]

## 2019-08-24 ENCOUNTER — Other Ambulatory Visit: Payer: Self-pay | Admitting: Family Medicine

## 2019-09-06 ENCOUNTER — Other Ambulatory Visit: Payer: Self-pay

## 2019-09-06 MED ORDER — LEVOTHYROXINE SODIUM 50 MCG PO TABS: ORAL_TABLET | ORAL | 3 refills | Status: DC

## 2019-09-20 ENCOUNTER — Encounter: Payer: Self-pay | Admitting: Family Medicine

## 2019-09-20 ENCOUNTER — Other Ambulatory Visit: Payer: Self-pay

## 2019-09-20 ENCOUNTER — Ambulatory Visit (INDEPENDENT_AMBULATORY_CARE_PROVIDER_SITE_OTHER): Payer: Medicare Other | Admitting: Family Medicine

## 2019-09-20 VITALS — BP 122/80 | HR 79 | Temp 97.3°F | Ht 61.0 in | Wt 164.6 lb

## 2019-09-20 DIAGNOSIS — H00012 Hordeolum externum right lower eyelid: Secondary | ICD-10-CM

## 2019-09-20 MED ORDER — ERYTHROMYCIN 5 MG/GM OP OINT: 1.0000 "application " | TOPICAL_OINTMENT | Freq: Every day | OPHTHALMIC | 0 refills | Status: DC

## 2019-09-20 NOTE — Progress Notes (Signed)
Patient: Dana Dillon MRN: [id number] DOB: [birth year] PCP: Shelva Majestic, MD     Subjective:  Chief Complaint  Patient presents with  . Stye    On the left side.    HPI: The patient is a 64 y.o. female who presents today for stye on the left eye that she noticed on Saturday night. On Sunday am she looked and saw a bump on the inside of hte lower left lid. On Monday the white color was gone and it was a small red bump.She has used hot compresses. She states today, the bump now looks like a linear  [place] and she feels like she is having swelling in her cheek area as well. She has discomfort opening and closing.  but is still painful and  continuing to swell. She has no eyelid swelling on top, but has swelling on the lower lid. No fever/chills. She states it will drain during night and she will wake up and eye is crusted close. She has some blurry vision and feels pressure in the eye.  She put over the counter stye ointment on it.   Review of Systems  HENT: Positive for facial swelling. Negative for ear pain and sinus pain.   Eyes: Positive for pain, discharge and redness.    Allergies Patient has No Known Allergies.  Past Medical History Patient  has a past medical history of Hot flashes, Hypertension, LYMPHOPENIA (05/04/2007), and RHINITIS (12/03/2009).  Surgical History Patient  has a past surgical history that includes Cesarean section and Carpal tunnel release.  Family History Pateint's family history includes Breast cancer in her sister; Heart failure in her father; Hyperlipidemia in her father; Hypertension in her father and mother; Stroke in her mother.  Social History Patient  reports that she quit smoking about 26 years ago. Her smoking use included cigarettes. She has a 15.00 pack-year smoking history. She has never used smokeless tobacco. She reports current alcohol use of about 7.0 standard drinks of alcohol per week. She reports that she does not use drugs.     Objective: Vitals:   09/20/19 1431  BP: 122/80  Pulse: 79  Temp: (!) 97.3 F (36.3 C)  TempSrc: Temporal  SpO2: 98%  Weight: 164 lb 9.6 oz (74.7 kg)  Height: 5\' 1"  (1.549 m)    Body mass index is 31.1 kg/m.  Physical Exam Vitals reviewed.  Constitutional:      Appearance: Normal appearance. She is obese.  HENT:     Head: Normocephalic and atraumatic.     Right Ear: Tympanic membrane and ear canal normal.     Left Ear: Ear canal normal. There is impacted cerumen.     Nose: Nose normal.  Eyes:     Extraocular Movements: Extraocular movements intact.     Conjunctiva/sclera: Conjunctivae normal.     Pupils: Pupils are equal, round, and reactive to light.     Comments: Left lower lid internal stye with minimal edema and erythema of lid. No surrounding erythema of skin  Neurological:     Mental Status: She is alert.        Assessment/plan: 1. Hordeolum of right lower eyelid, unspecified hordeolum type Continue warm compresses four times a day. Massage glands. She wants to try antibiotic ointment. Will do trial of erythromycin ointment nightly. If not better in 1-2 weeks she will let me know so we can send to ophthalmology for drainage.  No evidence of preseptal cellulitis, but precautions given.     This visit  occurred during the SARS-CoV-2 public health emergency.  Safety protocols were in place, including screening questions prior to the visit, additional usage of staff PPE, and extensive cleaning of exam room while observing appropriate contact time as indicated for disinfecting solutions.     Return if symptoms worsen or fail to improve.   Orland Mustard, MD [facility]   09/20/2019

## 2019-09-20 NOTE — Patient Instructions (Signed)

## 2019-09-21 ENCOUNTER — Telehealth: Payer: Self-pay | Admitting: Family Medicine

## 2019-09-21 NOTE — Telephone Encounter (Signed)
Please call and schedule pt for OV. 

## 2019-09-21 NOTE — Telephone Encounter (Signed)
Additional Comment She has red bumps and swelling in the face now and ear congestion. This has been going on for 5 days. Translation No Nurse Assessment Nurse: Annye English, RN, Denise Date/Time (Eastern Time): 09/20/2019 12:46:29 PM Confirm and document reason for call. If symptomatic, describe symptoms. ---Pt has redness/swelling and sty on lower lid of left eye. Redness/swelling around the eye now. Left ear congested. Has the patient had close contact with a person known or suspected to have the novel coronavirus illness OR traveled / lives in area with major community spread (including international travel) in the last 14 days from the onset of symptoms? * If Asymptomatic, screen for exposure and travel within the last 14 days. ---No Does the patient have any new or worsening symptoms? ---Yes Will a triage be completed? ---Yes Related visit to physician within the last 2 weeks? ---No Does the PT have any chronic conditions? (i.e. diabetes, asthma, this includes High risk factors for pregnancy, etc.) ---No Is this a behavioral health or substance abuse call? ---No Guidelines Guideline Title Affirmed Question Affirmed Notes Nurse Date/Time (Eastern Time) Sty Redness spreads around the eye (both upper and lower eyelid are red) Carmon, RN, Angelique Blonder 09/20/2019 12:48:13 PMPLEASE NOTE: All timestamps contained within this report are represented as [place] Standard Time. CONFIDENTIALTY NOTICE: This fax transmission is intended only for the addressee. It contains information that is legally privileged, confidential or otherwise protected from use or disclosure. If you are not the intended recipient, you are strictly prohibited from reviewing, disclosing, copying using or disseminating any of this information or taking any action in reliance on or regarding this information. If you have received this fax in error, please notify us immediately by telephone so that we can arrange for its return to  [place]. Phone: [phone], Toll-Free: [phone], Fax: [phone] Page: 2 of 2 Call Id: [id number] Disp. Time Lamount Cohen Time) Disposition Final User 09/20/2019 12:30:06 PM Send To RN Personal Lane Hacker, RN, Windy 09/20/2019 12:57:29 PM See HCP within 4 Hours (or PCP triage) Yes Carmon, RN, Leighton Ruff Disagree/Comply Comply Caller Understands Yes PreDisposition Call Doctor Care Advice Given Per Guideline SEE HCP (OR PCP TRIAGE) WITHIN 4 HOURS: CALL BACK IF: * You become worse CARE ADVICE given per Sty (Adult) guideline. Comments User: Greggory Stallion, RN Date/Time Lamount Cohen Time): 09/20/2019 12:57:17 PM Called [facility] for appt and was placed on lengthly hold. Trans pt to the [facility] main number for appt. Referrals REFERRED TO PCP OFFICE

## 2019-09-21 NOTE — Telephone Encounter (Signed)
Patient was seen yesterday.

## 2019-09-26 ENCOUNTER — Encounter: Payer: Self-pay | Admitting: Family Medicine

## 2019-10-02 ENCOUNTER — Other Ambulatory Visit: Payer: Self-pay | Admitting: Family Medicine

## 2019-10-12 DIAGNOSIS — Z1231 Encounter for screening mammogram for malignant neoplasm of breast: Secondary | ICD-10-CM | POA: Diagnosis not present

## 2019-11-14 DIAGNOSIS — E559 Vitamin D deficiency, unspecified: Secondary | ICD-10-CM | POA: Diagnosis not present

## 2019-11-14 DIAGNOSIS — Z13220 Encounter for screening for lipoid disorders: Secondary | ICD-10-CM | POA: Diagnosis not present

## 2019-11-14 DIAGNOSIS — Z13228 Encounter for screening for other metabolic disorders: Secondary | ICD-10-CM | POA: Diagnosis not present

## 2019-11-14 DIAGNOSIS — Z1321 Encounter for screening for nutritional disorder: Secondary | ICD-10-CM | POA: Diagnosis not present

## 2019-11-14 DIAGNOSIS — G4700 Insomnia, unspecified: Secondary | ICD-10-CM | POA: Diagnosis not present

## 2019-11-14 DIAGNOSIS — E039 Hypothyroidism, unspecified: Secondary | ICD-10-CM | POA: Diagnosis not present

## 2019-11-14 DIAGNOSIS — Z1329 Encounter for screening for other suspected endocrine disorder: Secondary | ICD-10-CM | POA: Diagnosis not present

## 2019-11-14 DIAGNOSIS — E785 Hyperlipidemia, unspecified: Secondary | ICD-10-CM | POA: Diagnosis not present

## 2019-11-19 ENCOUNTER — Telehealth: Payer: Self-pay | Admitting: Physician Assistant

## 2019-11-19 NOTE — Telephone Encounter (Signed)
Called to discuss the homebound Covid-19 vaccination initiative with the patient and/or caregiver.   Message left to call back.  Taiyana Kissler PA-C  [facility]     

## 2019-11-21 ENCOUNTER — Ambulatory Visit: Payer: Medicare Other | Attending: Family Medicine

## 2019-11-21 DIAGNOSIS — Z23 Encounter for immunization: Secondary | ICD-10-CM

## 2019-11-21 NOTE — Progress Notes (Signed)
   Covid-19 Vaccination Clinic  Name:  Dana Dillon    MRN: [id number] DOB: 13-Jun-1955  11/21/2019  [name] was observed post Covid-19 immunization for 15 minutes without incident. She was provided with Vaccine Information Sheet and instruction to access the V-Safe system.   [name] was instructed to call 911 with any severe reactions post vaccine: Marland Kitchen Difficulty breathing  . Swelling of face and throat  . A fast heartbeat  . A bad rash all over body  . Dizziness and weakness   Immunizations Administered    Name Date Dose VIS Date Route   Pfizer COVID-19 Vaccine 11/21/2019 12:30 PM 0.3 mL 11/01/2019 Intramuscular   Manufacturer: [employer], [employer]   Lot: [id number]   NDC: [address]-6

## 2019-11-24 ENCOUNTER — Telehealth: Payer: Self-pay

## 2019-11-24 NOTE — Telephone Encounter (Signed)
Please look into the following billing question below: Patient called in saying she received a bill for $175 that states insurance was not filed, when going into the appointment seems like we filed with insurance. Please advise.    Has patient reached out to billing?  Just recommended that patient call billing about the issue

## 2019-11-27 NOTE — Telephone Encounter (Signed)
Noted. Thanks.

## 2019-12-25 ENCOUNTER — Encounter: Payer: Self-pay | Admitting: Family Medicine

## 2019-12-26 NOTE — Telephone Encounter (Signed)
LVM for patient to call back and scheudle.

## 2020-02-20 ENCOUNTER — Encounter: Payer: Self-pay | Admitting: Family Medicine

## 2020-02-20 ENCOUNTER — Telehealth (INDEPENDENT_AMBULATORY_CARE_PROVIDER_SITE_OTHER): Payer: Medicare Other | Admitting: Family Medicine

## 2020-02-20 VITALS — BP 144/79 | HR 90 | Ht 62.0 in | Wt 156.0 lb

## 2020-02-20 DIAGNOSIS — E039 Hypothyroidism, unspecified: Secondary | ICD-10-CM

## 2020-02-20 DIAGNOSIS — I10 Essential (primary) hypertension: Secondary | ICD-10-CM | POA: Diagnosis not present

## 2020-02-20 DIAGNOSIS — Z636 Dependent relative needing care at home: Secondary | ICD-10-CM | POA: Diagnosis not present

## 2020-02-20 DIAGNOSIS — F419 Anxiety disorder, unspecified: Secondary | ICD-10-CM | POA: Diagnosis not present

## 2020-02-20 MED ORDER — ESCITALOPRAM OXALATE 10 MG PO TABS: 10.0000 mg | ORAL_TABLET | Freq: Every day | ORAL | 5 refills | Status: DC

## 2020-02-20 NOTE — Progress Notes (Signed)
Phone [phone] Virtual visit via Video note   Subjective:  Chief complaint: Chief Complaint  Patient presents with  . Discuss Medication    Discuss her fathers medication and wanted to know if she could be placed on an antidepressant herself.    This visit type was conducted due to national recommendations for restrictions regarding the COVID-19 Pandemic (e.g. social distancing).  This format is felt to be most appropriate for this patient at this time balancing risks to patient and risks to population by having him in for in person visit.  No physical exam was performed (except for noted visual exam or audio findings with Telehealth visits).    Our team/I connected with Dana Dillon at  1:20 PM EST by a video enabled telemedicine application ([email or web address] or caregility through epic) and verified that I am speaking with the correct person using two identifiers.  Location patient: [place] Location provider: [facility], office Persons participating in the virtual visit:  patient  Our team/I discussed the limitations of evaluation and management by telemedicine and the availability of in person appointments. In light of current covid-19 pandemic, patient also understands that we are trying to protect them by minimizing in office contact if at all possible.  The patient expressed consent for telemedicine visit and agreed to proceed. Patient understands insurance will be billed.   Past Medical History-  Patient Active Problem List   Diagnosis Date Noted  . Caregiver burden 12/11/2016    Priority: High  . Anxiety 05/12/2018    Priority: Medium  . Hypothyroidism 05/12/2018    Priority: Medium  . Hypertension 11/13/2005    Priority: Medium  . Former smoker 12/28/2015    Priority: Low  . Hot flash, menopausal 06/29/2014    Priority: Low  . Allergic rhinitis 12/03/2009    Priority: Low  . 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 Plantar fasciitis of left foot  05/14/2017    Medications- reviewed and updated Current Outpatient Medications  Medication Sig Dispense Refill  . busPIRone (BUSPAR) 5 MG tablet Take 1 tablet (5 mg total) by mouth 2 (two) times daily as needed. 180 tablet 3  . Cholecalciferol (VITAMIN D3 PO) Take 5,000 Units by mouth daily.    Marland Kitchen escitalopram (LEXAPRO) 10 MG tablet Take 1 tablet (10 mg total) by mouth daily. 30 tablet 5  . gabapentin (NEURONTIN) 100 MG capsule Take 100 mg by mouth daily. Patient takes 2 tablets at bedtime daily.    Marland Kitchen levothyroxine (SYNTHROID) 50 MCG tablet Take one tab every morning 90 tablet 3  . losartan-hydrochlorothiazide (HYZAAR) 100-25 MG tablet TAKE ONE TABLET BY MOUTH DAILY 30 tablet 0  . MAGNESIUM CITRATE PO Take by mouth.    . erythromycin ophthalmic ointment Place 1 application into the right eye at bedtime. (Patient not taking: Reported on 02/20/2020) 3.5 g 0   No current facility-administered medications for this visit.     Objective:  BP (!) 144/79   Pulse 90   Ht 5\' 2"  (1.575 m)   Wt 156 lb (70.8 kg)   BMI 28.53 kg/m  self reported vitals Gen: NAD, resting comfortably Lungs: nonlabored, normal respiratory rate  Skin: appears dry, no obvious ras     Assessment and Plan   #Anxiety/depressed mood/caregiver burden S: Medication: Buspirone 5 mg twice daily as needed. Started recently twice a day and one of her friends said she was not acting like herself- went back to as needed as was drowsy/slowed.   A  lot of stress with mother having falls/requring hospital stay and now needing 24 hour supervision- cameras on- runs down to help mom when uses bathroom. Hoping mom will improve. Dad did well for 5 weeks while in hospital- dressing himself, washing himself- ws doing 90% of his needs- far more than when her mother was home- when she came back home- he started with significant mood swings. Gets aggressive with words and even gestured like he may hit someone or pounding the table. Some nights  he is up half the night and other nights he sleeps. He seems paranoid.   They need to transition to assisted living and he will be difficult about that most likely  On effexor for hot flashes in past but affected BP. 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  This seems like a situational anxiety/depression but we do not see a short-term end insight even though they are looking at transitioning to assisted living-we opted to try Lexapro 10 mg to see if this could help with some of her symptoms.  She already has buspirone and can continue that as needed.  My hope is within 6 weeks she notices a substantial improvement.  We opted to schedule follow-up at that time.  She has had blood work with her gynecologist-she is going to call them and try to get [place] a copy of those records-otherwise I would have liked to check her thyroid level but it may be included in the blood work.  If she has new or worsening symptoms or thoughts of self-harm she needs to contact us immediately  #hypertension S: medication: Losartan hydrochlorothiazide 100-25 mg  BP Readings from Last 3 Encounters:  02/20/20 (!) 144/79  09/20/19 122/80  05/12/18 124/72  A/P: Blood pressure slightly high today but under intense stress-she is going to try to take some time for herself and recheck and let me know if blood pressure does not come back down-prefer less than 135/85 on home  readings.  #hypothyroidism S: compliant On thyroid medication-evothyroxine 50 mcg Lab Results  Component Value Date   TSH 2.04 09/08/2018  A/P: Thyroid has been well controlled in the past-we are going to get records to see if this has recently been updated-if not we will need to check this particular with the anxiety/depression which underactive thyroid or overactive thyroid can contribute to  Recommended follow up: 6 weeks follow up   Lab/Order associations:   ICD-10-CM   1. Caregiver burden  Z63.6   2. Anxiety  F41.9   3. Primary hypertension  I10   4. Hypothyroidism, unspecified type  E03.9     Meds ordered this encounter  Medications  . escitalopram (LEXAPRO) 10 MG tablet    Sig: Take 1 tablet (10 mg total) by mouth daily.    Dispense:  30 tablet    Refill:  5    Return precautions  advised.  Garret Reddish, MD

## 2020-02-20 NOTE — Patient Instructions (Addendum)
Health Maintenance Due  Topic Date Due  . DEXA SCAN  Never done  . PNA vac Low Risk Adult (1 of 2 - PCV13) Never done   Taking the medicine as directed and not missing any doses is one of the best things you can do to treat your depression/anxiety.  Here are some things to keep in mind:  1) Side effects (stomach upset, some increased anxiety) may happen before you notice a benefit.  These side effects typically go away over time. 2) Changes to your dose of medicine or a change in medication all together is sometimes necessary 3) Most people need to be on medication at least 6-12 months 4) Many people will notice an improvement within two weeks but the full effect of the medication can take up to 4-6 weeks 5) Stopping the medication when you start feeling better often results in a return of symptoms 6) If you start having thoughts of hurting yourself or others after starting this medicine, call our office immediately at [phone] or seek care through 911.    Recommended follow up: Return in about 6 weeks (around 04/02/2020) for follow up- or sooner if needed.

## 2020-03-05 ENCOUNTER — Encounter: Payer: Self-pay | Admitting: Family Medicine

## 2020-03-08 ENCOUNTER — Encounter: Payer: Self-pay | Admitting: Family Medicine

## 2020-04-11 ENCOUNTER — Encounter: Payer: Self-pay | Admitting: Physician Assistant

## 2020-04-11 ENCOUNTER — Other Ambulatory Visit: Payer: Self-pay

## 2020-04-11 ENCOUNTER — Ambulatory Visit (INDEPENDENT_AMBULATORY_CARE_PROVIDER_SITE_OTHER): Payer: Medicare Other | Admitting: Physician Assistant

## 2020-04-11 VITALS — BP 118/68 | HR 88 | Temp 98.1°F | Ht 62.0 in | Wt 161.2 lb

## 2020-04-11 DIAGNOSIS — J0100 Acute maxillary sinusitis, unspecified: Secondary | ICD-10-CM | POA: Diagnosis not present

## 2020-04-11 MED ORDER — CEFPROZIL 500 MG PO TABS: 500.0000 mg | ORAL_TABLET | Freq: Two times a day (BID) | ORAL | 0 refills | Status: AC

## 2020-04-11 NOTE — Progress Notes (Signed)
Acute Office Visit  Subjective:    Patient ID: Dana Dillon, female    DOB: 1955-12-05, 65 y.o.   MRN: [id number]  Chief Complaint  Patient presents with  . Sinus Problem    HPI Patient is in today for sinus symptoms.  Patient states that she went to a wedding about 15 days ago after worse and since then has had sinus pressure and facial pain.  She is having a lot of purulent drainage down the back of her throat.  She is taking over-the-counter allergy medicine and Flonase without any relief.  Feels like she is getting some headaches from the symptoms as well.  No fever, chills, body aches, loss of taste or smell, or any sick contacts.  Past Medical History:  Diagnosis Date  . Hot flashes   . Hypertension   . LYMPHOPENIA 05/04/2007   saw oncology- later normalized. ? virus related  . RHINITIS 12/03/2009    Past Surgical History:  Procedure Laterality Date  . CARPAL TUNNEL RELEASE     right side. Dr. Amanda Pea  . CESAREAN SECTION     x2    Family History  Problem Relation Age of Onset  . Breast cancer Sister   . Hypertension Mother   . Stroke Mother        in 37s  . Heart failure Father   . Hypertension Father   . Hyperlipidemia Father     Social History   Socioeconomic History  . Marital status: Married    Spouse name: Not on file  . Number of children: Not on file  . Years of education: Not on file  . Highest education level: Not on file  Occupational History  . Not on file  Tobacco Use  . Smoking status: Former Smoker    Packs/day: 1.00    Years: 15.00    Pack years: 15.00    Types: Cigarettes    Quit date: 05/20/1993    Years since quitting: 26.9  . Smokeless tobacco: Never Used  Substance and Sexual Activity  . Alcohol use: Yes    Alcohol/week: 7.0 standard drinks    Types: 7 drink(s) per week  . Drug use: No  . Sexual activity: Not on file  Other Topics Concern  . Not on file  Social History Narrative   Married husband Richard patient of Dr.  Durene Cal. 2 children. 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 busPIRone (BUSPAR) 5 MG tablet Take 1 tablet (5 mg total) by mouth 2 (two) times daily as needed. 180 tablet 3  . Cholecalciferol (VITAMIN D3 PO) Take 5,000 Units by mouth daily.    Marland Kitchen gabapentin (NEURONTIN) 100 MG capsule Take 100 mg by mouth daily. Patient takes 2 tablets at bedtime daily.    Marland Kitchen levothyroxine (SYNTHROID) 50 MCG tablet Take one tab every morning 90 tablet 3  . losartan-hydrochlorothiazide (  HYZAAR) 100-25 MG tablet TAKE ONE TABLET BY MOUTH DAILY 30 tablet 0  . MAGNESIUM CITRATE PO Take by mouth.    . escitalopram (LEXAPRO) 10 MG tablet Take 1 tablet (10 mg total) by mouth daily. 30 tablet 5  . erythromycin ophthalmic ointment Place 1 application into the right eye at bedtime. (Patient not taking: Reported on 02/20/2020) 3.5 g 0   No facility-administered medications prior to visit.    No Known Allergies  Review of Systems REFER TO HPI FOR PERTINENT POSITIVES AND NEGATIVES     Objective:    Physical Exam Vitals and nursing note reviewed.  Constitutional:      Appearance: Normal appearance.  HENT:     Head: Normocephalic and atraumatic.     Comments: Tender over maxillary sinuses    Right Ear: Tympanic membrane and ear canal normal.     Left Ear: Tympanic membrane and ear canal normal.     Mouth/Throat:     Mouth: Mucous membranes are moist.  Eyes:     Extraocular Movements:  Extraocular movements intact.     Pupils: Pupils are equal, round, and reactive to light.  Cardiovascular:     Rate and Rhythm: Normal rate and regular rhythm.     Pulses: Normal pulses.     Heart sounds: Normal heart sounds. No murmur heard.   Pulmonary:     Effort: Pulmonary effort is normal.     Breath sounds: Normal breath sounds.  Neurological:     Mental Status: She is alert.     BP 118/68   Pulse 88   Temp 98.1 F (36.7 C)   Ht 5\' 2"  (1.575 m)   Wt 161 lb 3.2 oz (73.1 kg)   SpO2 97%   BMI 29.48 kg/m  Wt Readings from Last 3 Encounters:  04/11/20 161 lb 3.2 oz (73.1 kg)  02/20/20 156 lb (70.8 kg)  09/20/19 164 lb 9.6 oz (74.7 kg)    Health Maintenance Due  Topic Date Due  . DEXA SCAN  Never done  . PNA vac Low Risk Adult (1 of 2 - PCV13) Never done  . PAP SMEAR-Modifier  05/10/2020    There are no preventive care reminders to display for this patient.   Lab Results  Component Value Date   TSH 2.04 09/08/2018   Lab Results  Component Value Date   WBC 7.2 09/08/2018   HGB 12.6 09/08/2018   HCT 37.6 09/08/2018   MCV 87.7 09/08/2018   PLT 208.0 09/08/2018   Lab Results  Component Value Date   NA 136 09/08/2018   K 3.7 09/08/2018   CO2 28 09/08/2018   GLUCOSE 108 (H) 09/08/2018   BUN 31 (H) 09/08/2018   CREATININE 0.82 09/08/2018   BILITOT 0.7 09/08/2018   ALKPHOS 55 09/08/2018   AST 20 09/08/2018   ALT 22 09/08/2018   PROT 6.9 09/08/2018   ALBUMIN 4.4 09/08/2018   CALCIUM 9.4 09/08/2018   GFR 70.17 09/08/2018   Lab Results  Component Value Date   CHOL 242 (H) 09/08/2018   Lab Results  Component Value Date   HDL 63.80 09/08/2018   Lab Results  Component Value Date   LDLCALC 153 (H) 09/08/2018   Lab Results  Component Value Date   TRIG 127.0 09/08/2018   Lab Results  Component Value Date   CHOLHDL 4 09/08/2018   No results found for: HGBA1C     Assessment & Plan:   Problem List Items Addressed This Visit  None    1.  Acute non-recurrent maxillary sinusitis Findings are consistent with maxillary sinusitis.  I am going to start her on cefprozil to take as directed.  She will continue nasal saline, Flonase, allergy medicine daily.  May need to consider Depo-Medrol 80 mg IM if still no improvement in the pressure pain of her face.  She says she will call back with updates on how she is doing.  This note was prepared with assistance of Conservation officer, historic buildings. Occasional wrong-word or sound-a-like substitutions may have occurred due to the inherent limitations of voice recognition software.    Trinady Milewski M Cohen Doleman, PA-C

## 2020-04-11 NOTE — Patient Instructions (Signed)
Follow-up if worse or no improvement.

## 2020-04-17 ENCOUNTER — Telehealth: Payer: Self-pay

## 2020-04-17 NOTE — Telephone Encounter (Signed)
I agree with plan stated. She definitely needs COVID send out test and then we can go from there.

## 2020-04-17 NOTE — Telephone Encounter (Signed)
Spoke with patient she will be tested for covid clinic 04/18/20 @ [facility]

## 2020-04-17 NOTE — Telephone Encounter (Signed)
Pt was seen by Alyssa last week for a sinus infection. Pt states she will be done with her antibiotics tomorrow but she is not feeling any better. Pt states she is still congested and has lost all taste and smell. Pt states she took an at home covid test and it was negative. Pt asked if there is anything else that Alyssa can send in. Please advise.

## 2020-04-18 ENCOUNTER — Ambulatory Visit: Payer: Medicare Other | Attending: Critical Care Medicine

## 2020-04-18 DIAGNOSIS — Z20822 Contact with and (suspected) exposure to covid-19: Secondary | ICD-10-CM

## 2020-04-19 LAB — SARS-COV-2, NAA 2 DAY TAT

## 2020-04-19 LAB — NOVEL CORONAVIRUS, NAA: SARS-CoV-2, NAA: DETECTED — AB

## 2020-05-06 ENCOUNTER — Encounter: Payer: Self-pay | Admitting: Family Medicine

## 2020-06-24 DIAGNOSIS — Z13820 Encounter for screening for osteoporosis: Secondary | ICD-10-CM | POA: Diagnosis not present

## 2020-06-24 DIAGNOSIS — M8588 Other specified disorders of bone density and structure, other site: Secondary | ICD-10-CM | POA: Diagnosis not present

## 2020-06-24 DIAGNOSIS — E785 Hyperlipidemia, unspecified: Secondary | ICD-10-CM | POA: Diagnosis not present

## 2020-06-24 DIAGNOSIS — M8580 Other specified disorders of bone density and structure, unspecified site: Secondary | ICD-10-CM | POA: Diagnosis not present

## 2020-06-25 LAB — LIPID PANEL
Cholesterol: 272 — AB (ref 0–200)
HDL: 64 (ref 35–70)
LDL Cholesterol: 180
LDl/HDL Ratio: 2.8
Triglycerides: 153 (ref 40–160)

## 2020-06-28 ENCOUNTER — Encounter: Payer: Self-pay | Admitting: Family Medicine

## 2020-07-09 NOTE — Progress Notes (Signed)
Phone [phone] In person visit   Subjective:   Dana Dillon is a 65 y.o. year old very pleasant female patient who presents for/with See problem oriented charting Chief Complaint  Patient presents with   Hyperlipidemia    This visit occurred during the SARS-CoV-2 public health emergency.  Safety protocols were in place, including screening questions prior to the visit, additional usage of staff PPE, and extensive cleaning of exam room while observing appropriate contact time as indicated for disinfecting solutions.   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     MAGNESIUM CITRATE PO Take by mouth.     busPIRone (BUSPAR) 5 MG tablet Take 1 tablet (5 mg total) by mouth 2 (two) times daily as needed. (Patient not taking: Reported on 07/10/2020) 180 tablet 3   erythromycin ophthalmic ointment Place 1 application into the right eye at bedtime. (Patient not taking: No sig reported) 3.5 g 0   levothyroxine (SYNTHROID) 50 MCG tablet Take one tab every morning 90 tablet 3   losartan-hydrochlorothiazide (HYZAAR) 100-25 MG tablet Take 1 tablet by mouth daily. 90 tablet 3   No current facility-administered medications for this visit.     Objective:  BP 129/85   Pulse 75   Temp 98.6 F (37 C) (Temporal)    Ht 5\' 2"  (1.575 m)   Wt 159 lb 9.6 oz (72.4 kg)   SpO2 98%   BMI 29.19 kg/m  Gen: NAD, resting comfortably CV: RRR no murmurs rubs or gallops Lungs: CTAB no crackles, wheeze, rhonchi Ext: no edema Skin: warm, dry     Assessment and Plan   #hyperlipidemia S: Medication:none  -avoids eggs recently- egg white only, avoids pork, only beef she does is grassfed filets 2-3 x a year, - eats salmon, tuna, pea protein - towards end of parents lives they were eating out more than usual -also stopped exercising trying to care for parents- plans to restart Lab Results  Component Value Date   CHOL 272 (A) 06/24/2020   HDL 64 06/24/2020   LDLCALC 180 06/24/2020   TRIG 153 06/24/2020   CHOLHDL 4 09/08/2018   A/P: 10 year ascvd risk at 8.3%. diet has been looser recently and not able to exercise- reasonable to restart these and recheck. If persistently high she would consider coronary artery calcium scoring.   #hypertension S: medication: losartan hctz 100-25 mg doing at night now- has been very helpful- no more hot flashes (may have been BP related) in the evening Home readings #s: have improved 118/72 on average at home BP Readings from Last 3 Encounters:  07/10/20 129/85  04/11/20 118/68  02/20/20 (!) 144/79  A/P: Stable. Continue current medications.   #hypothyroidism S: compliant On thyroid medication- levothyroxine 50 mcg TSH also  [facility] Lab Results  Component Value Date   TSH 2.04 09/08/2018    A/P:has been stable - could consider recheck when check lipids bc that could affect levels. Update tsh next visit and continue levothyrxine 50 mcg for now   # Anxiety/GAD S:medications: Buspirone 5 mg. Restarted antidepressant through GYN- she will let me know the name.  A/P:With recent loss of her mother and father-patient has restarted antidepressant due to ongoing stress and grieving-patient is going to reach out let me know the name of  medication - Too early to tell if this is going to be beneficial - discussed hospice grief counseling   Recommended follow up: Return in about 8 months (around 03/11/2021) for physical or sooner if needed.   Lab/Order associations:   ICD-10-CM   1. Primary hypertension  I10     2. Hypothyroidism, unspecified type  E03.9     3. Hyperlipidemia, unspecified hyperlipidemia type  E78.5       Meds ordered this encounter  Medications   levothyroxine (SYNTHROID) 50 MCG tablet    Sig: Take one tab every morning    Dispense:  90 tablet    Refill:  3   losartan-hydrochlorothiazide (HYZAAR) 100-25 MG tablet    Sig: Take 1 tablet by mouth daily.    Dispense:  90 tablet    Refill:  3   Return precautions advised.  Tana Conch, MD

## 2020-07-10 ENCOUNTER — Encounter: Payer: Self-pay | Admitting: Family Medicine

## 2020-07-10 ENCOUNTER — Other Ambulatory Visit: Payer: Self-pay

## 2020-07-10 ENCOUNTER — Ambulatory Visit (INDEPENDENT_AMBULATORY_CARE_PROVIDER_SITE_OTHER): Payer: Medicare Other | Admitting: Family Medicine

## 2020-07-10 VITALS — BP 129/85 | HR 75 | Temp 98.6°F | Ht 62.0 in | Wt 159.6 lb

## 2020-07-10 DIAGNOSIS — I10 Essential (primary) hypertension: Secondary | ICD-10-CM | POA: Diagnosis not present

## 2020-07-10 DIAGNOSIS — E039 Hypothyroidism, unspecified: Secondary | ICD-10-CM

## 2020-07-10 DIAGNOSIS — E785 Hyperlipidemia, unspecified: Secondary | ICD-10-CM

## 2020-07-10 MED ORDER — LOSARTAN POTASSIUM-HCTZ 100-25 MG PO TABS: 1.0000 | ORAL_TABLET | Freq: Every day | ORAL | 3 refills | Status: DC

## 2020-07-10 MED ORDER — LEVOTHYROXINE SODIUM 50 MCG PO TABS: ORAL_TABLET | ORAL | 3 refills | Status: DC

## 2020-07-10 NOTE — Patient Instructions (Addendum)
Health Maintenance Due  Topic Date Due   Zoster Vaccines- Shingrix (1 of 2) uld consider at future visit Never done   DEXA SCAN Sign release of information at the check out desk  Never done   PNA vac Low Risk Adult (1 of 2 - PCV13) - consider prevnar 20 next visit Never done   COVID-19 Vaccine (4 - Booster for [employer] series) She will have this scheduled in the fall.  03/20/2020   PAP SMEAR-check with GYN if you need an additional pap smear 05/10/2020   No cholesterol medicine for now- focus on healthy eating/regular exercise and then we can reevaluate  Thrilled BP is doing so well with changing to nighttime dose  Recommended follow up: Return in about 8 months (around 03/11/2021) for physical or sooner if needed.

## 2020-07-12 ENCOUNTER — Telehealth: Payer: Self-pay

## 2020-07-12 NOTE — Telephone Encounter (Signed)
Pt's medication list has been updated.  

## 2020-07-12 NOTE — Telephone Encounter (Signed)
Patient is calling in stating she was prescribed some medication for depression, ESCITALOPRAM 10 MG once daily. Wanting to update the medication list.

## 2020-08-24 LAB — HM DEXA SCAN

## 2021-02-26 ENCOUNTER — Encounter (INDEPENDENT_AMBULATORY_CARE_PROVIDER_SITE_OTHER): Payer: Self-pay

## 2021-02-26 DIAGNOSIS — H524 Presbyopia: Secondary | ICD-10-CM | POA: Diagnosis not present

## 2021-02-26 DIAGNOSIS — H31001 Unspecified chorioretinal scars, right eye: Secondary | ICD-10-CM | POA: Diagnosis not present

## 2021-03-04 ENCOUNTER — Ambulatory Visit (INDEPENDENT_AMBULATORY_CARE_PROVIDER_SITE_OTHER): Payer: Medicare Other | Admitting: Ophthalmology

## 2021-03-04 ENCOUNTER — Other Ambulatory Visit: Payer: Self-pay

## 2021-03-04 ENCOUNTER — Encounter (INDEPENDENT_AMBULATORY_CARE_PROVIDER_SITE_OTHER): Payer: Self-pay | Admitting: Ophthalmology

## 2021-03-04 DIAGNOSIS — H35371 Puckering of macula, right eye: Secondary | ICD-10-CM | POA: Diagnosis not present

## 2021-03-04 DIAGNOSIS — H35351 Cystoid macular degeneration, right eye: Secondary | ICD-10-CM | POA: Insufficient documentation

## 2021-03-04 DIAGNOSIS — H35021 Exudative retinopathy, right eye: Secondary | ICD-10-CM

## 2021-03-04 NOTE — Assessment & Plan Note (Signed)
This will need to be evaluated by FA as well.  If this is leakage CME is present if not this is actually foveal macular schisis

## 2021-03-04 NOTE — Assessment & Plan Note (Signed)
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

## 2021-03-04 NOTE — Progress Notes (Signed)
03/04/2021     CHIEF COMPLAINT Patient presents for  Chief Complaint  Patient presents with   Retina Evaluation      HISTORY OF PRESENT ILLNESS: Dana Dillon is a 66 y.o. female who presents to the clinic today for:   HPI     Retina Evaluation           Laterality: right eye   Onset: 1 week ago         Comments   NP- eval of scarring OD, referred by Dr. Lucina Mellow. Evaluation of retinal scar and OCT macula OD. Seen by Dr. Lucina Mellow 02/26/2021.  Pt has complaints of vision blurred and worse in the right eye. Denies FOL. Pt states "I think I see a floater in the right eye every once in a while." Pt states "I have a lot of inflammation in my neck. I went to chiropractor because I had a lot of headaches when I was younger. I still have headaches, but they are nothing like they were back then. The chiropractor said my neck was so inflamed that I have a lot of pressure on my nerves. I don't know if that effects my eyes."      Last edited by Nelva Nay on 03/04/2021 10:30 AM.      Referring physician: Earnstine Regal, OD [address] [place],  [place] [address]  HISTORICAL INFORMATION:   Selected notes from the MEDICAL RECORD NUMBER       CURRENT MEDICATIONS: Current Outpatient Medications (Ophthalmic Drugs)  Medication Sig   erythromycin ophthalmic ointment Place 1 application into the right eye at bedtime. (Patient not taking: No sig reported)   No current facility-administered medications for this visit. (Ophthalmic Drugs)   Current Outpatient Medications (Other)  Medication Sig   busPIRone (BUSPAR) 5 MG tablet Take 1 tablet (5 mg total) by mouth 2 (two) times daily as needed. (Patient not taking: Reported on 07/10/2020)   Cholecalciferol (VITAMIN D3 PO) Take 5,000 Units by mouth daily.   escitalopram (LEXAPRO) 10 MG tablet Take 10 mg by mouth daily.   levothyroxine (SYNTHROID) 50 MCG tablet Take one tab every morning   losartan-hydrochlorothiazide (HYZAAR) 100-25  MG tablet Take 1 tablet by mouth daily.   MAGNESIUM CITRATE PO Take by mouth.   No current facility-administered medications for this visit. (Other)      REVIEW OF SYSTEMS: ROS   Negative for: Constitutional, Gastrointestinal, Neurological, Skin, Genitourinary, Musculoskeletal, HENT, Endocrine, Cardiovascular, Eyes, Respiratory, Psychiatric, Allergic/Imm, Heme/Lymph Last edited by Edmon Crape, MD on 03/04/2021 11:12 AM.       ALLERGIES No Known Allergies  PAST MEDICAL HISTORY Past Medical History:  Diagnosis Date   Hot flashes    Hypertension    LYMPHOPENIA 05/04/2007   saw oncology- later normalized. ? virus related   RHINITIS 12/03/2009   Past Surgical History:  Procedure Laterality Date   CARPAL TUNNEL RELEASE     right side. Dr. Amanda Pea   CESAREAN SECTION     x2    FAMILY HISTORY Family History  Problem Relation Age of Onset   Breast cancer Sister    Hypertension Mother    Stroke Mother        in 34s   Heart failure Father    Hypertension Father    Hyperlipidemia Father     SOCIAL HISTORY Social History   Tobacco Use   Smoking status: Former    Packs/day: 1.00    Years: 15.00    Pack  years: 15.00    Types: Cigarettes    Quit date: 05/20/1993    Years since quitting: 27.8   Smokeless tobacco: Never  Substance Use Topics   Alcohol use: Yes    Alcohol/week: 7.0 standard drinks    Types: 7 drink(s) per week   Drug use: No         OPHTHALMIC EXAM:  Base Eye Exam     Visual Acuity (ETDRS)       Right Left   Dist [place] 20/30 -2 20/20 -1         Tonometry (Tonopen, 10:26 AM)       Right Left   Pressure 23 21         Pupils       Pupils Dark Light Shape React APD   Right PERRL 6 4 Round Brisk None   Left PERRL 6 4 Round Brisk None         Visual Fields (Counting fingers)       Left Right    Full Full         Extraocular Movement       Right Left    Full Full         Neuro/Psych     Oriented x3: Yes    Mood/Affect: Normal         Dilation     Both eyes: 1.0% Mydriacyl, 2.5% Phenylephrine @ 10:26 AM           Slit Lamp and Fundus Exam     External Exam       Right Left   External Normal Normal         Slit Lamp Exam       Right Left   Lids/Lashes Normal Normal   Conjunctiva/Sclera White and quiet White and quiet   Cornea Clear Clear   Anterior Chamber Deep and quiet Deep and quiet   Iris Round and reactive Round and reactive   Lens 1+ Nuclear sclerosis 1+ Nuclear sclerosis   Anterior Vitreous Normal Normal         Fundus Exam       Right Left   Posterior Vitreous Posterior vitreous detachment Posterior vitreous detachment   Disc Normal Normal   C/D Ratio 0.25 0.25   Macula Epiretinal membrane mild topographic distortion Epiretinal membrane mild topographic distortion   Vessels Normal exteriorly and peripherally except over the lesion described under the periphery Normal exteriorly and peripherally except over the lesion described under the periphery   Periphery Region centered at 5:30 position anterior to the equator and extending to the ora serrata with the posterior edge of white subretinal fibrosis rimmed by hyperpigmented RPE changes.  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 Central Foveal Thickness: 381.  Progression has no prior data. Findings include cystoid macular edema,  epiretinal membrane, abnormal foveal contour.   Left Eye Quality was good. Scan locations included subfoveal. Central Foveal Thickness: 283. Progression has no prior data. Findings include normal foveal contour.   Notes OD with foveal macular schisis and and disruption of the outer photoreceptor layer  Centrally above the center of the fovea.,  Associate with overlying epiretinal membrane.     Color Fundus Photography Optos - OU - Both Eyes       Right Eye Progression has no prior data. Disc findings include normal observations. Macula : normal observations. Vessels : normal observations.   Left Eye Progression has no prior data. Disc findings include normal observations. Macula : normal observations. Vessels : normal observations. Periphery : normal observations.   Notes OD, inferonasal, 530 meridian over the posterior edge of hyperpigmentation and subretinal fibrosis in the exudative lesions seen peripherally on clinical examination is notable.  We will need fluorescein angiography with lid retraction neck so as to identify an underlying vascular etiology if present             ASSESSMENT/PLAN:  Right epiretinal membrane Moderate epiretinal membrane yet with some thinning of the retina and foveal macular schisis and 1 area outer retinal dehiscence of the photoreceptor layer we will need to follow this area closely  Exudative retinopathy, right eye Peripheral localized region of exudative retinopathy which appears chronic as there is a associated subretinal white fibrosis surrounded by subretinal and intraretinal hyperpigmentation signifying this has been present for some months if not years.  Overlying vascular irregularity and underlying retinal exudate does however suggest needs fluorescein angiography to delineate the exact nature of the underlying disorder.  Cystoid macular edema of right eye This  will need to be evaluated by FA as well.  If this is leakage CME is present if not this is actually foveal macular schisis     ICD-10-CM   1. Exudative retinopathy, right eye  H35.021 Color Fundus Photography Optos - OU - Both Eyes    2. Right epiretinal membrane  H35.371 Color Fundus Photography Optos - OU - Both Eyes    3. Cystoid macular edema of right eye  H35.351 OCT, Retina - OU - Both Eyes    Color Fundus Photography Optos - OU - Both Eyes      1.  OD with mild to moderate epiretinal membrane.  Will likely need vitrectomy membrane peel with future only a visual acuity impact  2.  The asymptomatic exudative retinopathy present with overlying vasculopathy may have impact on the macula but not likely, will need fluorescein angiography to confirm  3.  We will monitor structurally OD if this is CME or in fact foveal macular schisis   4.  Next dilate OU, FFA OD OS, R/L Ophthalmic Meds Ordered this visit:  No orders of the defined types were placed in this encounter.      Return in about 1 week (around 03/11/2021), or , May require lid retraction right eye, with FFA, for DILATE OU, OPTOS FFA R/L, COLOR FP, OCT, photographer may require lid retraction.  There are no Patient Instructions on file for this visit.   Explained the diagnoses, plan, and follow up with the patient and they expressed understanding.  Patient expressed understanding of the importance of proper follow up care.   Alford Highland Keyna Blizard M.D. Diseases & Surgery of the Retina and Vitreous [facility] 03/04/21     Abbreviations: M myopia (nearsighted); A astigmatism; H hyperopia (farsighted); P presbyopia; Mrx  spectacle prescription;  CTL contact lenses; OD right eye; OS left eye; OU both eyes  XT exotropia; ET esotropia; PEK punctate epithelial keratitis; PEE punctate epithelial erosions; DES dry eye syndrome; MGD meibomian gland dysfunction; ATs artificial tears; PFAT's preservative free artificial  tears; NSC nuclear sclerotic cataract; PSC posterior subcapsular cataract; ERM epi-retinal membrane; PVD posterior vitreous detachment; RD retinal detachment; DM diabetes mellitus; DR diabetic retinopathy; NPDR non-proliferative diabetic retinopathy; PDR proliferative diabetic retinopathy; CSME clinically significant macular edema; DME diabetic macular edema; dbh dot blot hemorrhages; CWS cotton wool spot; POAG primary open angle glaucoma; C/D cup-to-disc ratio; HVF humphrey visual field; GVF goldmann visual field; OCT optical coherence tomography; IOP intraocular pressure; BRVO Branch retinal vein occlusion; CRVO central retinal vein occlusion; CRAO central retinal artery occlusion; BRAO branch retinal artery occlusion; RT retinal tear; SB scleral buckle; PPV pars plana vitrectomy; VH Vitreous hemorrhage; PRP panretinal laser photocoagulation; IVK intravitreal kenalog; VMT vitreomacular traction; MH Macular hole;  NVD neovascularization of the disc; NVE neovascularization elsewhere; AREDS age related eye disease study; ARMD age related macular degeneration; POAG primary open angle glaucoma; EBMD epithelial/anterior basement membrane dystrophy; ACIOL anterior chamber intraocular lens; IOL intraocular lens; PCIOL posterior chamber intraocular lens; Phaco/IOL phacoemulsification with intraocular lens placement; PRK photorefractive keratectomy; LASIK laser assisted in situ keratomileusis; HTN hypertension; DM diabetes mellitus;

## 2021-03-04 NOTE — Assessment & Plan Note (Signed)
Peripheral localized region of exudative retinopathy which appears chronic as there is a associated subretinal white fibrosis surrounded by subretinal and intraretinal hyperpigmentation signifying this has been present for some months if not years.  Overlying vascular irregularity and underlying retinal exudate does however suggest needs fluorescein angiography to delineate the exact nature of the underlying disorder.

## 2021-03-11 ENCOUNTER — Encounter (INDEPENDENT_AMBULATORY_CARE_PROVIDER_SITE_OTHER): Payer: Self-pay | Admitting: Ophthalmology

## 2021-03-11 ENCOUNTER — Other Ambulatory Visit: Payer: Self-pay

## 2021-03-11 ENCOUNTER — Ambulatory Visit (INDEPENDENT_AMBULATORY_CARE_PROVIDER_SITE_OTHER): Payer: Medicare Other | Admitting: Ophthalmology

## 2021-03-11 ENCOUNTER — Encounter: Payer: Medicare Other | Admitting: Family Medicine

## 2021-03-11 DIAGNOSIS — H35371 Puckering of macula, right eye: Secondary | ICD-10-CM

## 2021-03-11 DIAGNOSIS — H35021 Exudative retinopathy, right eye: Secondary | ICD-10-CM

## 2021-03-11 DIAGNOSIS — H35351 Cystoid macular degeneration, right eye: Secondary | ICD-10-CM

## 2021-03-11 MED ORDER — FLUORESCEIN SODIUM 10 % IV SOLN
500.0000 mg | INTRAVENOUS | Status: AC | PRN
  Administered 2021-03-11: 500 mg via INTRAVENOUS

## 2021-03-11 NOTE — Assessment & Plan Note (Signed)
Minor OD, no impact on vision OD currently

## 2021-03-11 NOTE — Progress Notes (Signed)
03/11/2021     CHIEF COMPLAINT Patient presents for  Chief Complaint  Patient presents with   Retina Follow Up      HISTORY OF PRESENT ILLNESS: Dana Dillon is a 66 y.o. female who presents to the clinic today for:   HPI     Retina Follow Up           Diagnosis: Other (Exudative Retinopathy)   Laterality: right eye   Onset: 1 week ago   Course: stable         Comments   1 week fu Dilate OU, FFA R/L, FP, OCT. Patient states vision is stable and unchanged since last visit. Denies any new floaters or FOL.       Last edited by Laurin Coder on 03/11/2021 10:53 AM.      Referring physician: Marin Olp, [place],  [place] [address]  HISTORICAL INFORMATION:   Selected notes from the MEDICAL RECORD NUMBER       CURRENT MEDICATIONS: Current Outpatient Medications (Ophthalmic Drugs)  Medication Sig   erythromycin ophthalmic ointment Place 1 application into the right eye at bedtime. (Patient not taking: No sig reported)   No current facility-administered medications for this visit. (Ophthalmic Drugs)   Current Outpatient Medications (Other)  Medication Sig   busPIRone (BUSPAR) 5 MG tablet Take 1 tablet (5 mg total) by mouth 2 (two) times daily as needed. (Patient not taking: Reported on 07/10/2020)   Cholecalciferol (VITAMIN D3 PO) Take 5,000 Units by mouth daily.   escitalopram (LEXAPRO) 10 MG tablet Take 10 mg by mouth daily.   levothyroxine (SYNTHROID) 50 MCG tablet Take one tab every morning   losartan-hydrochlorothiazide (HYZAAR) 100-25 MG tablet Take 1 tablet by mouth daily.   MAGNESIUM CITRATE PO Take by mouth.   No current facility-administered medications for this visit. (Other)      REVIEW OF SYSTEMS: ROS   Negative for: Constitutional, Gastrointestinal, Neurological, Skin, Genitourinary, Musculoskeletal, HENT, Endocrine, Cardiovascular, Eyes, Respiratory, Psychiatric, Allergic/Imm, Heme/Lymph Last edited by Hurman Horn, MD on 03/11/2021 11:30 AM.       ALLERGIES No Known Allergies  PAST MEDICAL HISTORY Past Medical History:  Diagnosis Date   Hot flashes    Hypertension    LYMPHOPENIA 05/04/2007   saw oncology- later normalized. ? virus related   RHINITIS 12/03/2009   Past Surgical History:  Procedure Laterality Date   CARPAL TUNNEL RELEASE     right side. Dr. Amedeo Plenty   CESAREAN SECTION     x2    FAMILY HISTORY Family History  Problem Relation Age of Onset   Breast cancer Sister    Hypertension Mother    Stroke Mother        in 39s   Heart failure Father    Hypertension Father    Hyperlipidemia Father     SOCIAL HISTORY Social History   Tobacco Use   Smoking status: Former    Packs/day: 1.00    Years: 15.00    Pack years: 15.00    Types: Cigarettes    Quit date: 05/20/1993    Years since quitting: 27.8   Smokeless tobacco: Never  Substance Use Topics   Alcohol use: Yes    Alcohol/week: 7.0 standard drinks    Types: 7 drink(s) per week   Drug use: No         OPHTHALMIC EXAM:  Base Eye Exam     Visual Acuity (ETDRS)  Right Left   Dist [place] 20/30 -1+2 20/20 -1         Tonometry (Tonopen, 10:56 AM)       Right Left   Pressure 22 15         Pupils       Pupils Dark Light APD   Right PERRL 5 4 None   Left PERRL 5 4 None         Extraocular Movement       Right Left    Full Full         Neuro/Psych     Oriented x3: Yes   Mood/Affect: Normal         Dilation     Both eyes: 1.0% Mydriacyl, 2.5% Phenylephrine @ 10:56 AM           Slit Lamp and Fundus Exam     External Exam       Right Left   External Normal Normal         Slit Lamp Exam       Right Left   Lids/Lashes Normal Normal   Conjunctiva/Sclera White and quiet White and quiet   Cornea Clear Clear   Anterior Chamber Deep and quiet Deep and quiet   Iris Round and reactive Round and reactive   Lens 1+ Nuclear sclerosis 1+ Nuclear sclerosis   Anterior  Vitreous Normal Normal         Fundus Exam       Right Left   Posterior Vitreous Posterior vitreous detachment Posterior vitreous detachment   Disc Normal Normal   C/D Ratio 0.25 0.25   Macula Epiretinal membrane mild topographic distortion Epiretinal membrane mild topographic distortion   Vessels Normal exteriorly and peripherally except over the lesion described under the periphery Normal exteriorly and peripherally except over the lesion described under the periphery   Periphery Region centered at 5:30 position anterior to the equator and extending to the ora serrata with the posterior edge of white subretinal fibrosis rimmed by hyperpigmented RPE changes.  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 Mid/Late phase findings include leakage. Choroidal neovascularization is not present.   Left Eye Choroidal neovascularization is not present.   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  In the mid mid and anterior periphery of the right eye, there are deep Small leakages of vasculature no overt vasculitis, yet there are regions of widely spaced telangiectatic vessels which do leak in the late phases  that are often seen in adult coat syndrome, I have also seen this however in patients with untreated sleep apnea triggering nightly hypoxic superimposed hypertensive changes. This for this reason that I have encouraged the patient to seek nighttime  home sleep study to evaluate for nighttime hypoxic issues             ASSESSMENT/PLAN:  Cystoid macular edema of right eye OD, cystoid macular edema document on OCT andNighttime hypoxia potentially from undiagnosed or untreated sleep apnea.  Perifoveal CME OCT,  it is in fact CME as can firmed by fluorescein angiography.  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  In the mid mid and anterior periphery of the right eye, there are deep Small leakages of vasculature no overt vasculitis, yet there are regions of widely spaced telangiectatic vessels which do leak in the late phases that are often seen in adult coat syndrome, I have also seen this however in patients with untreated sleep apnea triggering nightly hypoxic superimposed hypertensive changes. 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  This is adjacent to the area of chorioretinal atrophy and exudative retinopathy at the 5:00 meridian anteriorly.  May consider laser ablation to this area simply to demarcate and prevent progression  Right epiretinal membrane Minor OD, no impact on vision OD currently     ICD-10-CM   1. Exudative retinopathy, right eye  H35.021 OCT, Retina - OU - Both Eyes    Color Fundus  Photography Optos - OU - Both Eyes    Fluorescein Angiography Optos (Transit OD)    Fluorescein Sodium 10 % injection 500 mg    2. Cystoid macular edema of right eye  H35.351     3. Right epiretinal membrane  H35.371       1.  OD, will likely need treatment of adult coat syndrome in the far temporal periphery, nonemergency basis.  2.  The discussion regarding nighttime hypoxia and its damage and similar experiences in this practice in the past.  3.  Upon further review of symptoms patient does have signs with sometimes daily sleepiness and/or fatigability upon a long sleep at night, suggested that she might in fact have some degree of sleep apnea.  She is interested in being tested  Ophthalmic Meds Ordered this visit:  Meds ordered this encounter  Medications   Fluorescein Sodium 10 % injection 500 mg       Return in about 6 weeks (around 04/22/2021) for dilate, OD, COLOR FP, consider local focal laser lesion inferotemporal OD.  There are no Patient Instructions on file for this visit.   Explained the diagnoses, plan, and follow up with the patient and they expressed understanding.  Patient expressed understanding of the importance of proper follow up care.   Clent Demark Standley Bargo M.D. Diseases & Surgery of the Retina and Vitreous [facility] 03/11/21     Abbreviations: M myopia (nearsighted); A astigmatism; H hyperopia (farsighted); P presbyopia; Mrx spectacle prescription;  CTL contact lenses; OD right eye; OS left eye; OU both eyes  XT exotropia; ET esotropia; PEK punctate epithelial keratitis; PEE punctate epithelial erosions; DES dry eye syndrome; MGD meibomian gland dysfunction; ATs artificial tears; PFAT's preservative free artificial tears; [place] nuclear sclerotic cataract; PSC posterior subcapsular cataract; ERM  epi-retinal membrane; PVD posterior vitreous detachment; RD retinal detachment; DM diabetes mellitus; DR diabetic retinopathy; NPDR non-proliferative  diabetic retinopathy; PDR proliferative diabetic retinopathy; CSME clinically significant macular edema; DME diabetic macular edema; dbh dot blot hemorrhages; CWS cotton wool spot; POAG primary open angle glaucoma; C/D cup-to-disc ratio; HVF humphrey visual field; GVF goldmann visual field; OCT optical coherence tomography; IOP intraocular pressure; BRVO Branch retinal vein occlusion; CRVO central retinal vein occlusion; CRAO central retinal artery occlusion; BRAO branch retinal artery occlusion; RT retinal tear; SB scleral buckle; PPV pars plana vitrectomy; VH Vitreous hemorrhage; PRP panretinal laser photocoagulation; IVK intravitreal kenalog; VMT vitreomacular traction; MH Macular hole;  NVD neovascularization of the disc; NVE neovascularization elsewhere; AREDS age related eye disease study; ARMD age related macular degeneration; POAG primary open angle glaucoma; EBMD epithelial/anterior basement membrane dystrophy; ACIOL anterior chamber intraocular lens; IOL intraocular lens; PCIOL posterior chamber intraocular lens; Phaco/IOL phacoemulsification with intraocular lens placement; [place] photorefractive keratectomy; LASIK laser assisted in situ keratomileusis; HTN hypertension; DM diabetes mellitus; COPD chronic obstructive pulmonary disease

## 2021-03-11 NOTE — Assessment & Plan Note (Addendum)
OD, cystoid macular edema document on OCT andNighttime hypoxia potentially from undiagnosed or untreated sleep apnea.  Perifoveal CME OCT,  it is in fact CME as can firmed by fluorescein angiography.  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  In the mid mid and anterior periphery of the right eye, there are deep Small leakages of vasculature no overt vasculitis, yet there are regions of widely spaced telangiectatic vessels which do leak in the late phases that are often seen in adult coat syndrome, I have also seen this however in patients with untreated sleep apnea triggering nightly hypoxic superimposed hypertensive changes. This for this reason that I have encouraged the patient to seek nighttime home sleep study to evaluate for nighttime hypoxic issues

## 2021-03-11 NOTE — Assessment & Plan Note (Signed)
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  This is adjacent to the area of chorioretinal atrophy and exudative retinopathy at the 5:00 meridian anteriorly.  May consider laser ablation to this area simply to demarcate and prevent progression

## 2021-03-14 NOTE — Progress Notes (Incomplete)
Phone [phone] In person visit   Subjective:   Dana Dillon is a 66 y.o. year old very pleasant female patient who presents for/with See problem oriented charting No chief complaint on file.   This visit occurred during the SARS-CoV-2 public health emergency.  Safety protocols were in place, including screening questions prior to the visit, additional usage of staff PPE, and extensive cleaning of exam room while observing appropriate contact time as indicated for disinfecting solutions.   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 (Patient not taking: Reported on 07/10/2020) 180 tablet 3   Cholecalciferol (VITAMIN D3 PO) Take 5,000 Units by mouth daily.     erythromycin ophthalmic ointment Place 1 application into the right eye at bedtime. (Patient not taking: No sig reported) 3.5 g 0   escitalopram (LEXAPRO) 10 MG tablet Take 10 mg by mouth daily.     levothyroxine (SYNTHROID) 50 MCG tablet Take one tab every morning 90 tablet 3   losartan-hydrochlorothiazide (HYZAAR) 100-25 MG tablet Take 1 tablet by mouth daily. 90 tablet 3   MAGNESIUM CITRATE PO Take by mouth.     No current facility-administered medications for this visit.     Objective:  There were no vitals taken for this  visit. Gen: NAD, resting comfortably CV: RRR no murmurs rubs or gallops Lungs: CTAB no crackles, wheeze, rhonchi Abdomen: soft/nontender/nondistended/normal bowel sounds. No rebound or guarding.  Ext: no edema Skin: warm, dry Neuro: grossly normal, moves all extremities  ***    Assessment and Plan    #Sleep Apnea?/ Neck pain S:***  A/P: ***    #hyperlipidemia S: Medication:none  -avoids eggs recently- egg white only, avoids pork, only beef she does is grassfed filets 2-3 x a year, - eats salmon, tuna, pea protein - towards end of parents lives they were eating out more than usual -also stopped exercising tried to care for parents- planned to restart Lab Results  Component Value Date   CHOL 272 (A) 06/24/2020   HDL 64 06/24/2020   LDLCALC 180 06/24/2020   TRIG 153 06/24/2020   CHOLHDL 4 09/08/2018   A/P: ***   #hypertension S: medication: losartan hctz 100-25 mg doing at night now- had been very helpful- no more hot flashes (may had been BP related) in the evening Home readings #s: *** BP Readings from Last 3 Encounters:  07/10/20 129/85  04/11/20 118/68  02/20/20 (!) 144/79  A/P: ***   #hypothyroidism S: compliant On thyroid medication- levothyroxine 50 mcg TSH also normal through [facility] 11/14/19 Lab Results  Component Value Date   TSH 2.04 09/08/2018    A/P:***   # Anxiety/GAD S:medications: Buspirone 5 mg. Restarted antidepressant through GYN- she will let me know the name.  A/P:***  Health Maintenance Due  Topic Date Due  Zoster Vaccines- Shingrix (1 of 2) Never done   Pneumonia Vaccine 64+ Years old (1 - PCV) Never done   DEXA SCAN  Never done   COVID-19 Vaccine (4 - Booster for Pfizer series) 01/16/2020   INFLUENZA VACCINE  Never done   MAMMOGRAM  08/15/2020   Recommended follow up: No follow-ups on file. Future Appointments  Date Time Provider Department Center  03/25/2021  9:40 AM Shelva Majestic, MD [facility]  04/22/2021  10:15 AM Rankin, Alford Highland, MD [facility] None  05/20/2021 10:40 AM Shelva Majestic, MD [facility]    Lab/Order associations: No diagnosis found.  No orders of the defined types were placed in this encounter.   I,Jada Bradford,acting as a scribe for Tana Conch, MD.,have documented all relevant documentation on the behalf of Tana Conch, MD,as directed by  Tana Conch, MD while in the presence of Tana Conch, MD.  *** Return precautions advised.  Scheryl Marten

## 2021-03-25 ENCOUNTER — Encounter: Payer: Self-pay | Admitting: Family Medicine

## 2021-03-25 ENCOUNTER — Ambulatory Visit (INDEPENDENT_AMBULATORY_CARE_PROVIDER_SITE_OTHER): Payer: Medicare Other | Admitting: Family Medicine

## 2021-03-25 VITALS — BP 110/64 | HR 50 | Temp 97.5°F | Ht 62.0 in | Wt 169.8 lb

## 2021-03-25 DIAGNOSIS — M542 Cervicalgia: Secondary | ICD-10-CM | POA: Diagnosis not present

## 2021-03-25 DIAGNOSIS — F419 Anxiety disorder, unspecified: Secondary | ICD-10-CM | POA: Diagnosis not present

## 2021-03-25 DIAGNOSIS — E039 Hypothyroidism, unspecified: Secondary | ICD-10-CM | POA: Diagnosis not present

## 2021-03-25 DIAGNOSIS — R0683 Snoring: Secondary | ICD-10-CM | POA: Diagnosis not present

## 2021-03-25 DIAGNOSIS — E785 Hyperlipidemia, unspecified: Secondary | ICD-10-CM

## 2021-03-25 DIAGNOSIS — R400 Somnolence: Secondary | ICD-10-CM | POA: Diagnosis not present

## 2021-03-25 DIAGNOSIS — I10 Essential (primary) hypertension: Secondary | ICD-10-CM

## 2021-03-25 LAB — CBC WITH DIFFERENTIAL/PLATELET
Basophils Absolute: 0 10*3/uL (ref 0.0–0.1)
Basophils Relative: 0.5 % (ref 0.0–3.0)
Eosinophils Absolute: 0.1 10*3/uL (ref 0.0–0.7)
Eosinophils Relative: 1.9 % (ref 0.0–5.0)
HCT: 37.8 % (ref 36.0–46.0)
Hemoglobin: 12.9 g/dL (ref 12.0–15.0)
Lymphocytes Relative: 31.7 % (ref 12.0–46.0)
Lymphs Abs: 2.3 10*3/uL (ref 0.7–4.0)
MCHC: 34 g/dL (ref 30.0–36.0)
MCV: 87.8 fl (ref 78.0–100.0)
Monocytes Absolute: 0.5 10*3/uL (ref 0.1–1.0)
Monocytes Relative: 6.9 % (ref 3.0–12.0)
Neutro Abs: 4.4 10*3/uL (ref 1.4–7.7)
Neutrophils Relative %: 59 % (ref 43.0–77.0)
Platelets: 182 10*3/uL (ref 150.0–400.0)
RBC: 4.31 Mil/uL (ref 3.87–5.11)
RDW: 13.8 % (ref 11.5–15.5)
WBC: 7.4 10*3/uL (ref 4.0–10.5)

## 2021-03-25 LAB — COMPREHENSIVE METABOLIC PANEL
ALT: 28 U/L (ref 0–35)
AST: 27 U/L (ref 0–37)
Albumin: 4.5 g/dL (ref 3.5–5.2)
Alkaline Phosphatase: 44 U/L (ref 39–117)
BUN: 17 mg/dL (ref 6–23)
CO2: 28 mEq/L (ref 19–32)
Calcium: 9.7 mg/dL (ref 8.4–10.5)
Chloride: 97 mEq/L (ref 96–112)
Creatinine, Ser: 0.74 mg/dL (ref 0.40–1.20)
GFR: 84.19 mL/min (ref >60.00)
Glucose, Bld: 90 mg/dL (ref 70–99)
Potassium: 3.6 mEq/L (ref 3.5–5.1)
Sodium: 136 mEq/L (ref 135–145)
Total Bilirubin: 0.6 mg/dL (ref 0.2–1.2)
Total Protein: 7.1 g/dL (ref 6.0–8.3)

## 2021-03-25 LAB — TSH: TSH: 2.01 u[IU]/mL (ref 0.35–5.50)

## 2021-03-25 MED ORDER — CYCLOBENZAPRINE HCL 5 MG PO TABS: 5.0000 mg | ORAL_TABLET | Freq: Every evening | ORAL | 1 refills | Status: DC | PRN

## 2021-03-25 NOTE — Patient Instructions (Addendum)
Have mammogram and bone density results sent to [place] at [phone]. ? ?We will call you within two weeks about your referral to pulmonology and sports medicine. If you do not hear within 2 weeks, give [place] a call.  ?- trial flexeril before bed as muscle relaxant- do not drive for 8 hours after taking ?   ? ?Please stop by lab before you go ?If you have mychart- we will send your results within 3 business days of [place] receiving them.  ?If you do not have mychart- we will call you about results within 5 business days of [place] receiving them.  ?*please also note that you will see labs on mychart as soon as they post. I will later go in and write notes on them- will say "notes from Dr. Durene Cal"  ? ?Recommended follow up: Return for next already scheduled visit or sooner if needed.  In may ?

## 2021-03-31 ENCOUNTER — Telehealth: Payer: Self-pay | Admitting: Family Medicine

## 2021-03-31 NOTE — Telephone Encounter (Signed)
Copied from CRM [id number]. Topic: Medicare AWV ?>> Mar 31, 2021  1:02 PM Harris-Coley, Avon Gully wrote: ?Reason for CRM: Left message for patient to schedule Annual Wellness Visit.  Please schedule with Nurse Health Advisor Lanier Ensign, RN at [facility].  Please call [phone] ask for Olegario Messier ?

## 2021-04-02 ENCOUNTER — Ambulatory Visit: Payer: Medicare Other | Admitting: Sports Medicine

## 2021-04-16 NOTE — Progress Notes (Signed)
? ? Benito Mccreedy D.Merril Abbe ?[facility] ?[place] ?Phone: [phone] ?  ?Assessment and Plan:   ?  ?1. Neck pain ?2. Somatic dysfunction of cervical region ?3. Somatic dysfunction of thoracic region ?4. Somatic dysfunction of rib region ?-Chronic with exacerbation, initial sports medicine visit ?- Intermittent neck pain for years with acute flare 2 weeks ago that has since significantly improved.  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  My interpretation: No acute fracture or dislocation.  Cortical changes most significant at C4-C5 with decreased disc space and cortical changes ?- Patient elected to trial initial OMT today.  Tolerated well per note below. ?- Decision today to treat with OMT was based on Physical Exam ? ?After verbal consent patient was treated with HVLA (high velocity low amplitude), ME (muscle energy), FPR (flex positional release), ST (soft tissue), PC/PD (Pelvic Compression/ Pelvic Decompression) techniques in cervical, rib, thoracic,  areas. Patient tolerated the procedure well with improvement in symptoms.  Patient educated on potential side effects of soreness and recommended to rest, hydrate, and use Tylenol as needed for pain control.  ?  ?Pertinent previous records reviewed include PCP note 03/25/2021 ?  ?Follow Up:  3 to 4 weeks for reevaluation.  Could repeat OMT if patient found it beneficial  ? ?  ?Subjective:   ?I, Pincus Badder, am serving as a Education administrator for Doctor [employer] ? ?Chief Complaint: left sided neck pain  ? ?HPI:  ? ?04/17/2021 ?Patient is a 66 year old female complaining of left sided neck pain. Patient states that she has always had left sided neck pain, 2 weeks ago she wasn't able to turn it , heating pad, 4 motrin, muscle  relaxer have helped, tight upper trap and neck , sometimes numbness and tingling cam sleep through the night sometimes , uses and ergonomic pillow, only radiates to the upper trap, she tries to work out at home gym when she does lat pull down that causes flare up, wants to discuss OMT ,  ? ?Relevant Historical Information: Hypertension, hypothyroidism ? ?Additional pertinent review of systems negative. ? ? ?Current Outpatient Medications:  ?  busPIRone (BUSPAR) 5 MG tablet, Take 1 tablet (5 mg total) by mouth 2 (two) times daily as needed., Disp: 180 tablet, Rfl: 3 ?  Cholecalciferol (VITAMIN D3 PO), Take 5,000 Units by mouth daily., Disp: , Rfl:  ?  cyclobenzaprine (FLEXERIL) 5 MG tablet, Take 1 tablet (5 mg total) by mouth at bedtime as needed for muscle spasms., Disp: 30 tablet, Rfl: 1 ?  escitalopram (LEXAPRO) 10 MG tablet, Take 10 mg by mouth daily., Disp: , Rfl:  ?  levothyroxine (SYNTHROID) 50 MCG tablet, Take one tab every morning, Disp: 90 tablet, Rfl: 3 ?  losartan-hydrochlorothiazide (HYZAAR) 100-25 MG tablet, Take 1 tablet by mouth daily., Disp: 90 tablet, Rfl: 3 ?  MAGNESIUM CITRATE PO, Take by mouth., Disp: , Rfl:   ? ?Objective:   ?  ?Vitals:  ? 04/17/21 1120  ?BP: 120/80  ?Pulse: 81  ?SpO2: 98%  ?Weight: 166 lb (75.3 kg)  ?Height: 5\' 2"  (1.575 m)  ?  ?  ?Body mass index is 30.36 kg/m?.  ?  ?Physical Exam:   ? ?General: Well-appearing, cooperative, sitting comfortably in no acute distress.  ? ?OMT Physical Exam: ? ?ASIS Compression Test:  Positive Right ?Cervical: TTP paraspinal, C3-5 RRSR ?Rib: Bilateral elevated first rib with TTP ?Thoracic: TTP paraspinal, increased kyphosis.  T5-7 RRSL ? ? ?Electronically signed by:  ?Benito Mccreedy D.Merril Abbe ?[facility] ?12:37 PM 04/17/21 ?

## 2021-04-17 ENCOUNTER — Ambulatory Visit: Payer: Medicare Other | Admitting: Sports Medicine

## 2021-04-17 ENCOUNTER — Ambulatory Visit (INDEPENDENT_AMBULATORY_CARE_PROVIDER_SITE_OTHER): Payer: Medicare Other

## 2021-04-17 VITALS — BP 120/80 | HR 81 | Ht 62.0 in | Wt 166.0 lb

## 2021-04-17 DIAGNOSIS — M542 Cervicalgia: Secondary | ICD-10-CM

## 2021-04-17 DIAGNOSIS — M9901 Segmental and somatic dysfunction of cervical region: Secondary | ICD-10-CM

## 2021-04-17 DIAGNOSIS — M9908 Segmental and somatic dysfunction of rib cage: Secondary | ICD-10-CM | POA: Diagnosis not present

## 2021-04-17 DIAGNOSIS — M9902 Segmental and somatic dysfunction of thoracic region: Secondary | ICD-10-CM

## 2021-04-17 NOTE — Patient Instructions (Addendum)
Good to see you  ?Neck and trap HEP  ?3-4 week follow up  ?

## 2021-04-22 ENCOUNTER — Encounter (INDEPENDENT_AMBULATORY_CARE_PROVIDER_SITE_OTHER): Payer: Medicare Other | Admitting: Ophthalmology

## 2021-04-24 ENCOUNTER — Other Ambulatory Visit: Payer: Self-pay | Admitting: Family Medicine

## 2021-04-29 DIAGNOSIS — Z1231 Encounter for screening mammogram for malignant neoplasm of breast: Secondary | ICD-10-CM | POA: Diagnosis not present

## 2021-04-29 LAB — HM MAMMOGRAPHY

## 2021-04-30 ENCOUNTER — Other Ambulatory Visit: Payer: Self-pay | Admitting: Obstetrics and Gynecology

## 2021-04-30 DIAGNOSIS — R928 Other abnormal and inconclusive findings on diagnostic imaging of breast: Secondary | ICD-10-CM

## 2021-05-06 ENCOUNTER — Other Ambulatory Visit: Payer: Self-pay | Admitting: Obstetrics and Gynecology

## 2021-05-06 DIAGNOSIS — R928 Other abnormal and inconclusive findings on diagnostic imaging of breast: Secondary | ICD-10-CM

## 2021-05-07 NOTE — Progress Notes (Signed)
? [name] D.Judd Gaudier ?[facility] ?[address] [place] [address] ?Phone: [phone] ?  ?Assessment and Plan:   ?  ?1. Neck pain ?-Chronic with exacerbation, subsequent visit ?- Intermittent neck pain for years that is overall improved since last office visit with intermittent use of ibuprofen, muscle relaxer, HEP ?- Patient feels that the full list of HEP led to more soreness in her shoulders.  I highlighted specific exercises, focusing on trapezius, that I recommend patient do moving forwards and she can decrease cycles to prevent soreness ?- May continue ibuprofen 800 mg and muscle relaxer as needed for pain relief ?- Patient only received partial benefit from OMT, so elected to not repeat treatment today ?  ?Pertinent previous records reviewed include none ?  ?Follow Up: 4 weeks for reevaluation.  Could consider formal PT versus trigger point injections versus repeat OMT based on patient's symptoms ?  ?Subjective:   ?I, Jerene Canny, am serving as a Neurosurgeon for Doctor [employer] ?  ?Chief Complaint: left sided neck pain  ?  ?HPI:  ?  ?04/17/2021 ?Patient is a 66 year old female complaining of left sided neck pain. Patient states that she has always had left sided neck pain, 2 weeks ago she wasn't able to turn it , heating pad, 4 motrin, muscle relaxer have helped, tight upper trap and neck , sometimes numbness and tingling cam sleep through the night sometimes , uses and ergonomic pillow, only radiates to the upper trap, she tries to work out at home gym when she does lat pull down that causes flare up, wants to discuss OMT ,  ? ?05/08/2021 ?Patient states that she is good , is sore for days on end after HEP , still tight in the upper back mostly the right but she is able to move her neck  ? ? ?  ?Relevant Historical Information: Hypertension, hypothyroidism ? ?Additional pertinent review of systems negative. ? ?Current Outpatient Medications  ?Medication Sig Dispense Refill  ?  busPIRone (BUSPAR) 5 MG tablet Take 1 tablet (5 mg total) by mouth 2 (two) times daily as needed. 180 tablet 3  ? Cholecalciferol (VITAMIN D3 PO) Take 5,000 Units by mouth daily.    ? cyclobenzaprine (FLEXERIL) 5 MG tablet Take 1 tablet (5 mg total) by mouth at bedtime as needed for muscle spasms. 30 tablet 1  ? escitalopram (LEXAPRO) 10 MG tablet TAKE 1 TABLET BY MOUTH EVERY DAY 30 tablet 5  ? levothyroxine (SYNTHROID) 50 MCG tablet Take one tab every morning 90 tablet 3  ? losartan-hydrochlorothiazide (HYZAAR) 100-25 MG tablet Take 1 tablet by mouth daily. 90 tablet 3  ? MAGNESIUM CITRATE PO Take by mouth.    ? ?No current facility-administered medications for this visit.  ?  ?  ?Objective:   ?  ?Vitals:  ? 05/08/21 1117  ?BP: 120/80  ?Weight: 167 lb (75.8 kg)  ?Height: 5\' 2"  (1.575 m)  ?  ?  ?Body mass index is 30.54 kg/m?.  ?  ?Physical Exam:   ?  ?Cervical Spine: Posture normal ?Skin: normal, intact ? ?Neurological:  ? ?Strength: ? Right  Left   ?Deltoid 5/5 5/5  ?Bicep 5/5  5/5  ?Tricep 5/5 5/5  ?Wrist Flexion 5/5 5/5  ?Wrist Extension 5/5 5/5  ?Grip 5/5 5/5  ?Finger Abduction 5/5 5/5  ? ?Sensation: intact to light touch in upper extremities bilaterally ? ?Spurling's:  negative bilaterally ?Neck ROM: Full active ROM ?TTP: Bilateral trapezius, thoracic paraspinal, cervical  paraspinal ?NTTP:  cervical spinous processes,   ? ?Electronically signed by:  ?[name] D.Judd Gaudier ?[facility] ?11:33 AM 05/08/21 ?

## 2021-05-08 ENCOUNTER — Ambulatory Visit: Payer: Medicare Other | Admitting: Sports Medicine

## 2021-05-08 VITALS — BP 120/80 | Ht 62.0 in | Wt 167.0 lb

## 2021-05-08 DIAGNOSIS — M542 Cervicalgia: Secondary | ICD-10-CM

## 2021-05-08 NOTE — Patient Instructions (Addendum)
Good to see you  4 week follow up   

## 2021-05-10 ENCOUNTER — Ambulatory Visit
Admission: RE | Admit: 2021-05-10 | Discharge: 2021-05-10 | Disposition: A | Discharge status: Home / Self Care | Payer: Medicare Other | Source: Ambulatory Visit | Attending: Obstetrics and Gynecology | Admitting: Obstetrics and Gynecology

## 2021-05-10 DIAGNOSIS — R928 Other abnormal and inconclusive findings on diagnostic imaging of breast: Secondary | ICD-10-CM

## 2021-05-10 DIAGNOSIS — N6011 Diffuse cystic mastopathy of right breast: Secondary | ICD-10-CM | POA: Diagnosis not present

## 2021-05-12 ENCOUNTER — Encounter: Payer: Self-pay | Admitting: Primary Care

## 2021-05-12 ENCOUNTER — Ambulatory Visit (INDEPENDENT_AMBULATORY_CARE_PROVIDER_SITE_OTHER): Payer: Medicare Other | Admitting: Primary Care

## 2021-05-12 VITALS — BP 130/62 | HR 79 | Temp 97.7°F | Ht 62.0 in | Wt 169.2 lb

## 2021-05-12 DIAGNOSIS — M950 Acquired deformity of nose: Secondary | ICD-10-CM

## 2021-05-12 DIAGNOSIS — R0683 Snoring: Secondary | ICD-10-CM | POA: Diagnosis not present

## 2021-05-12 NOTE — Progress Notes (Signed)
Reviewed and agree with assessment/plan. ? ? ?Cathlyn Tersigni, MD ?[facility] ?05/12/2021, 1:52 PM ?Pager:  [phone] ? ?

## 2021-05-12 NOTE — Progress Notes (Signed)
? ?@Patient  ID: , female    DOB: 1955-12-08, 66 y.o.   MRN: [id number] ? ?Chief Complaint  ?Patient presents with  ? sleep consult  ?  No prior sleep study-loud snoring, night sweats and daytime sleepiness  ? ? ?Referring provider: ?[id number], MD ? ?HPI: ?66 year old female, former smoker. PMH hypothyroidism, HTN, allergic rhinitis, hyperlipidemia, former smoker.  ? ?05/12/2021 ?Patient presents today for sleep consult. She has symptoms of loud snoring, occasional daytime sleepiness and restless sleep. Symptoms have been on and off her whole life, particularly worse after menopause. She has difficulty breathing out her nose. She has noticed left nasal passage is blocked. She uses flonse and afrin as needed along with breath right strip. She has a fluid leak behind right eye causing blurry vision , her ophthalmologist told her this can be caused by sleep apnea. She has not been evaluated by ENT. She is open to surgical options if needed.  ? ?Sleep questionnaire ?Symptoms-  Loud snoring, restless sleep and daytime sleepiness ?Prior sleep study- None ?Bedtime-9:30 ?Time to fall asleep- 15 mins ?Nocturnal awakenings- varies, 1-4 times  ?Out of bed/start of day- 6-6:30 or 8-830 ?Weight changes-  5lb up  ?Do you operate heavy machinery- No ?Do you currently wear CPAP- No ?Do you current wear oxygen- No  ?Epworth- 7 ? ? ?No Known Allergies ? ?Immunization History  ?Administered Date(s) Administered  ? PFIZER(Purple Top)SARS-COV-2 Vaccination 04/29/2019, 05/22/2019, 11/21/2019  ? Tdap 07/14/2016  ? ? ?Past Medical History:  ?Diagnosis Date  ? Hot flashes   ? Hypertension   ? LYMPHOPENIA 05/04/2007  ? saw oncology- later normalized. ? virus related  ? RHINITIS 12/03/2009  ? ? ?Tobacco History: ?Social History  ? ?Tobacco Use  ?Smoking Status Former  ? Packs/day: 1.00  ? Years: 15.00  ? Pack years: 15.00  ? Types: Cigarettes  ? Quit date: 05/20/1993  ? Years since quitting: 27.9  ?Smokeless Tobacco Never   ? ?Counseling given: Not Answered ? ? ?Outpatient Medications Prior to Visit  ?Medication Sig Dispense Refill  ? busPIRone (BUSPAR) 5 MG tablet Take 1 tablet (5 mg total) by mouth 2 (two) times daily as needed. 180 tablet 3  ? Cholecalciferol (VITAMIN D3 PO) Take 5,000 Units by mouth daily.    ? cyclobenzaprine (FLEXERIL) 5 MG tablet Take 1 tablet (5 mg total) by mouth at bedtime as needed for muscle spasms. 30 tablet 1  ? escitalopram (LEXAPRO) 10 MG tablet TAKE 1 TABLET BY MOUTH EVERY DAY 30 tablet 5  ? levothyroxine (SYNTHROID) 50 MCG tablet Take one tab every morning 90 tablet 3  ? losartan-hydrochlorothiazide (HYZAAR) 100-25 MG tablet Take 1 tablet by mouth daily. 90 tablet 3  ? MAGNESIUM CITRATE PO Take by mouth.    ? ?No facility-administered medications prior to visit.  ? ? ?Review of Systems ? ?Review of Systems  ?Constitutional:  Positive for fatigue.  ?HENT:  Positive for congestion.   ?Respiratory: Negative.    ?Psychiatric/Behavioral:  Positive for sleep disturbance.   ? ? ?Physical Exam ? ?BP 130/62 (BP Location: Left Arm, Cuff Size: Normal)   Pulse 79   Temp 97.7 ?F (36.5 ?C) (Temporal)   Ht 5\' 2"  (1.575 m)   Wt 169 lb 3.2 oz (76.7 kg)   SpO2 97%   BMI 30.95 kg/m?  ?Physical Exam ?Constitutional:   ?   General: She is not in acute distress. ?   Appearance: Normal appearance. She is not ill-appearing.  ?  HENT:  ?   Head: Normocephalic and atraumatic.  ?   Nose:  ?   Comments: Nasal deviation to the left  ?   Mouth/Throat:  ?   Mouth: Mucous membranes are moist.  ?   Pharynx: Oropharynx is clear.  ?   Comments: Mallampati class II-III ?Cardiovascular:  ?   Rate and Rhythm: Normal rate and regular rhythm.  ?Pulmonary:  ?   Effort: Pulmonary effort is normal.  ?   Breath sounds: Normal breath sounds.  ?Musculoskeletal:     ?   General: Normal range of motion.  ?Skin: ?   General: Skin is warm and dry.  ?Neurological:  ?   General: No focal deficit present.  ?   Mental Status: She is alert and  oriented to person, place, and time. Mental status is at baseline.  ?Psychiatric:     ?   Mood and Affect: Mood normal.     ?   Behavior: Behavior normal.     ?   Thought Content: Thought content normal.     ?   Judgment: Judgment normal.  ?  ? ?Lab Results: ? ?CBC ?   ?Component Value Date/Time  ? WBC 7.4 03/25/2021 1028  ? RBC 4.31 03/25/2021 1028  ? HGB 12.9 03/25/2021 1028  ? HCT 37.8 03/25/2021 1028  ? PLT 182.0 03/25/2021 1028  ? MCV 87.8 03/25/2021 1028  ? MCHC 34.0 03/25/2021 1028  ? RDW 13.8 03/25/2021 1028  ? LYMPHSABS 2.3 03/25/2021 1028  ? MONOABS 0.5 03/25/2021 1028  ? EOSABS 0.1 03/25/2021 1028  ? BASOSABS 0.0 03/25/2021 1028  ? ? ?BMET ?   ?Component Value Date/Time  ? NA 136 03/25/2021 1028  ? K 3.6 03/25/2021 1028  ? CL 97 03/25/2021 1028  ? CO2 28 03/25/2021 1028  ? GLUCOSE 90 03/25/2021 1028  ? BUN 17 03/25/2021 1028  ? CREATININE 0.74 03/25/2021 1028  ? CALCIUM 9.7 03/25/2021 1028  ? GFRNONAA 93 04/28/2007 1120  ? GFRAA 113 04/28/2007 1120  ? ? ?BNP ?No results found for: BNP ? ?ProBNP ?No results found for: PROBNP ? ?Imaging: ?DG Cervical Spine 2 or 3 views ? ?Result Date: 04/18/2021 ?CLINICAL DATA:  Chronic neck pain, intermittent radiating pain into bilateral shoulders. EXAM: CERVICAL SPINE - 2-3 VIEW COMPARISON:  None. FINDINGS: There is no evidence of cervical spine fracture or prevertebral soft tissue swelling. Straightening and mild reversal of the normal cervical lordosis. Trace anterolisthesis C2 on C3. Degenerative changes, with disc height loss most prominent C4-C5 and C5-C6. No other significant bone abnormalities are identified. IMPRESSION: No acute fracture or traumatic listhesis. Multilevel degenerative changes. Electronically Signed   By: Wiliam Ke M.D.   On: 04/18/2021 16:18  ? ?US BREAST LTD UNI RIGHT INC AXILLA ? ?Result Date: 05/10/2021 ?CLINICAL DATA:  Patient was called back for a right breast mass EXAM: DIGITAL DIAGNOSTIC UNILATERAL RIGHT MAMMOGRAM WITH TOMOSYNTHESIS AND  CAD; ULTRASOUND RIGHT BREAST LIMITED TECHNIQUE: Right digital diagnostic mammography and breast tomosynthesis was performed. The images were evaluated with computer-aided detection.; Targeted ultrasound examination of the right breast was performed COMPARISON:  Previous exam(s). ACR Breast Density Category b: There are scattered areas of fibroglandular density. FINDINGS: There are actually 2 masses in the medial right breast, 1 located inferiorly and the other located at approximately 2 o'clock. Targeted ultrasound is performed, showing 2 cysts in the right breast. The first cyst at 2 o'clock, 4 cm from the nipple measures up to 8 mm and contains  debris but no solid components. The second cyst is identified at 5 o'clock, 3 cm from the nipple, measuring 10 mm. IMPRESSION: Fibrocystic changes.  No evidence of malignancy. RECOMMENDATION: Annual screening mammography. I have discussed the findings and recommendations with the patient. If applicable, a reminder letter will be sent to the patient regarding the next appointment. BI-RADS CATEGORY  2: Benign. Electronically Signed   By: Gerome Samavid  Williams III M.D.   On: 05/10/2021 10:20 ? ?MM DIAG BREAST TOMO UNI RIGHT ? ?Result Date: 05/10/2021 ?CLINICAL DATA:  Patient was called back for a right breast mass EXAM: DIGITAL DIAGNOSTIC UNILATERAL RIGHT MAMMOGRAM WITH TOMOSYNTHESIS AND CAD; ULTRASOUND RIGHT BREAST LIMITED TECHNIQUE: Right digital diagnostic mammography and breast tomosynthesis was performed. The images were evaluated with computer-aided detection.; Targeted ultrasound examination of the right breast was performed COMPARISON:  Previous exam(s). ACR Breast Density Category b: There are scattered areas of fibroglandular density. FINDINGS: There are actually 2 masses in the medial right breast, 1 located inferiorly and the other located at approximately 2 o'clock. Targeted ultrasound is performed, showing 2 cysts in the right breast. The first cyst at 2 o'clock, 4 cm  from the nipple measures up to 8 mm and contains debris but no solid components. The second cyst is identified at 5 o'clock, 3 cm from the nipple, measuring 10 mm. IMPRESSION: Fibrocystic changes.  No evidence of maligna

## 2021-05-12 NOTE — Patient Instructions (Addendum)
Risk of untreated sleep apnea include cardiac arrhthymias, stroke, pulm HTN and diabetes ?  ?Treatment options include weight loss, oral appliance, CPAP therapy or referral to ENT for possible surgical options  ?  ?Recommendations: ?Work on weight loss efforts if needed  ?Do not drive if experiencing excessive daytime sleepiness  ?Sleep on your side or elevate your head while sleeping ?  ?Orders: ?HST re: snoring  ? ?Referral: ?ENT referral re: nasal deviation/chronic rhinitis  ? ?Follow-up: ?Please call after sleep study is completed/and seent by ENT to scheduled follow-up with Beth NP to review sleep study results (if you have not had study before office visit you can reschedule) ? ? ? ?Sleep Apnea ?Sleep apnea is a condition in which breathing pauses or becomes shallow during sleep. People with sleep apnea usually snore loudly. They may have times when they gasp and stop breathing for 10 seconds or more during sleep. This may happen many times during the night. ?Sleep apnea disrupts your sleep and keeps your body from getting the rest that it needs. This condition can increase your risk of certain health problems, including: ?Heart attack. ?Stroke. ?Obesity. ?Type 2 diabetes. ?Heart failure. ?Irregular heartbeat. ?High blood pressure. ?The goal of treatment is to help you breathe normally again. ?What are the causes? ? ?The most common cause of sleep apnea is a collapsed or blocked airway. ?There are three kinds of sleep apnea: ?Obstructive sleep apnea. This kind is caused by a blocked or collapsed airway. ?Central sleep apnea. This kind happens when the part of the brain that controls breathing does not send the correct signals to the muscles that control breathing. ?Mixed sleep apnea. This is a combination of obstructive and central sleep apnea. ?What increases the risk? ?You are more likely to develop this condition if you: ?Are overweight. ?Smoke. ?Have a smaller than normal airway. ?Are older. ?Are  female. ?Drink alcohol. ?Take sedatives or tranquilizers. ?Have a family history of sleep apnea. ?Have a tongue or tonsils that are larger than normal. ?What are the signs or symptoms? ?Symptoms of this condition include: ?Trouble staying asleep. ?Loud snoring. ?Morning headaches. ?Waking up gasping. ?Dry mouth or sore throat in the morning. ?Daytime sleepiness and tiredness. ?If you have daytime fatigue because of sleep apnea, you may be more likely to have: ?Trouble concentrating. ?Forgetfulness. ?Irritability or mood swings. ?Personality changes. ?Feelings of depression. ?Sexual dysfunction. This may include loss of interest if you are female, or erectile dysfunction if you are female. ?How is this diagnosed? ?This condition may be diagnosed with: ?A medical history. ?A physical exam. ?A series of tests that are done while you are sleeping (sleep study). These tests are usually done in a sleep lab, but they may also be done at home. ?How is this treated? ?Treatment for this condition aims to restore normal breathing and to ease symptoms during sleep. It may involve managing health issues that can affect breathing, such as high blood pressure or obesity. Treatment may include: ?Sleeping on your side. ?Using a decongestant if you have nasal congestion. ?Avoiding the use of depressants, including alcohol, sedatives, and narcotics. ?Losing weight if you are overweight. ?Making changes to your diet. ?Quitting smoking. ?Using a device to open your airway while you sleep, such as: ?An oral appliance. This is a custom-made mouthpiece that shifts your lower jaw forward. ?A continuous positive airway pressure (CPAP) device. This device blows air through a mask when you breathe out (exhale). ?A nasal expiratory positive airway pressure (EPAP) device. This  device has valves that you put into each nostril. ?A bi-level positive airway pressure (BIPAP) device. This device blows air through a mask when you breathe in (inhale) and  breathe out (exhale). ?Having surgery if other treatments do not work. During surgery, excess tissue is removed to create a wider airway. ?Follow these instructions at home: ?Lifestyle ?Make any lifestyle changes that your health care provider recommends. ?Eat a healthy, well-balanced diet. ?Take steps to lose weight if you are overweight. ?Avoid using depressants, including alcohol, sedatives, and narcotics. ?Do not use any products that contain nicotine or tobacco. These products include cigarettes, chewing tobacco, and vaping devices, such as e-cigarettes. If you need help quitting, ask your health care provider. ?General instructions ?Take over-the-counter and prescription medicines only as told by your health care provider. ?If you were given a device to open your airway while you sleep, use it only as told by your health care provider. ?If you are having surgery, make sure to tell your health care provider you have sleep apnea. You may need to bring your device with you. ?Keep all follow-up visits. This is important. ?Contact a health care provider if: ?The device that you received to open your airway during sleep is uncomfortable or does not seem to be working. ?Your symptoms do not improve. ?Your symptoms get worse. ?Get help right away if: ?You develop: ?Chest pain. ?Shortness of breath. ?Discomfort in your back, arms, or stomach. ?You have: ?Trouble speaking. ?Weakness on one side of your body. ?Drooping in your face. ?These symptoms may represent a serious problem that is an emergency. Do not wait to see if the symptoms will go away. Get medical help right away. Call your local emergency services (911 in the [place] Do not drive yourself to the hospital. ?Summary ?Sleep apnea is a condition in which breathing pauses or becomes shallow during sleep. ?The most common cause is a collapsed or blocked airway. ?The goal of treatment is to restore normal breathing and to ease symptoms during sleep. ?This  information is not intended to replace advice given to you by your health care provider. Make sure you discuss any questions you have with your health care provider. ?Document Revised: 08/07/2020 Document Reviewed: 12/08/2019 ?Elsevier Patient Education ? 2023 [employer] ? ?

## 2021-05-12 NOTE — Assessment & Plan Note (Signed)
-   Patient has difficulty breathing out her left nostril. Chronic nasal congestion. Using flonase and prn afrin. Referring to ENT. Getting HST to evaluate for OSA. Patient would be interested in surgical options if needed/recommended. FU after sleep study and ENT referral.  ?

## 2021-05-12 NOTE — Assessment & Plan Note (Addendum)
-   Patient has symptoms of loud snoring, occasional daytime sleepiness and restless sleep. Associated nasal congestion. Epworth score 7. BMI 30. Concern patient could have sleep apnea, patient needs home sleep study to evaluate for OSA. Reviewed risk of untreated sleep apnea including cardiac arrhthymias, stroke, pulm HTN and DM. We reviewed treatment options including weight loss, oral appliance, CPAP or referral to ENT. Recommend she see ENT after completing sleep study. Encourage side sleeping position and advised against driving if experiencing excessive daytime sleepiness. FU in 6 weeks to review sleep study or sooner if needed.   ?

## 2021-05-13 ENCOUNTER — Other Ambulatory Visit: Payer: Self-pay | Admitting: Family Medicine

## 2021-05-20 ENCOUNTER — Encounter: Payer: Self-pay | Admitting: Family Medicine

## 2021-05-20 ENCOUNTER — Ambulatory Visit (INDEPENDENT_AMBULATORY_CARE_PROVIDER_SITE_OTHER): Payer: Medicare Other | Admitting: Family Medicine

## 2021-05-20 VITALS — BP 118/72 | HR 81 | Temp 98.6°F | Ht 62.0 in | Wt 166.4 lb

## 2021-05-20 DIAGNOSIS — E039 Hypothyroidism, unspecified: Secondary | ICD-10-CM | POA: Diagnosis not present

## 2021-05-20 DIAGNOSIS — E785 Hyperlipidemia, unspecified: Secondary | ICD-10-CM

## 2021-05-20 DIAGNOSIS — Z0000 Encounter for general adult medical examination without abnormal findings: Secondary | ICD-10-CM

## 2021-05-20 DIAGNOSIS — I10 Essential (primary) hypertension: Secondary | ICD-10-CM | POA: Diagnosis not present

## 2021-05-20 DIAGNOSIS — F419 Anxiety disorder, unspecified: Secondary | ICD-10-CM

## 2021-05-20 LAB — COMPREHENSIVE METABOLIC PANEL
ALT: 22 U/L (ref 0–35)
AST: 20 U/L (ref 0–37)
Albumin: 4.4 g/dL (ref 3.5–5.2)
Alkaline Phosphatase: 50 U/L (ref 39–117)
BUN: 23 mg/dL (ref 6–23)
CO2: 29 mEq/L (ref 19–32)
Calcium: 9.3 mg/dL (ref 8.4–10.5)
Chloride: 100 mEq/L (ref 96–112)
Creatinine, Ser: 0.84 mg/dL (ref 0.40–1.20)
GFR: 72.24 mL/min (ref >60.00)
Glucose, Bld: 86 mg/dL (ref 70–99)
Potassium: 3.4 mEq/L — ABNORMAL LOW (ref 3.5–5.1)
Sodium: 140 mEq/L (ref 135–145)
Total Bilirubin: 0.5 mg/dL (ref 0.2–1.2)
Total Protein: 6.9 g/dL (ref 6.0–8.3)

## 2021-05-20 LAB — CBC WITH DIFFERENTIAL/PLATELET
Basophils Absolute: 0.1 10*3/uL (ref 0.0–0.1)
Basophils Relative: 0.6 % (ref 0.0–3.0)
Eosinophils Absolute: 0.2 10*3/uL (ref 0.0–0.7)
Eosinophils Relative: 2.2 % (ref 0.0–5.0)
HCT: 37.3 % (ref 36.0–46.0)
Hemoglobin: 12.7 g/dL (ref 12.0–15.0)
Lymphocytes Relative: 39.9 % (ref 12.0–46.0)
Lymphs Abs: 3.3 10*3/uL (ref 0.7–4.0)
MCHC: 34 g/dL (ref 30.0–36.0)
MCV: 88.2 fl (ref 78.0–100.0)
Monocytes Absolute: 0.7 10*3/uL (ref 0.1–1.0)
Monocytes Relative: 8.1 % (ref 3.0–12.0)
Neutro Abs: 4.1 10*3/uL (ref 1.4–7.7)
Neutrophils Relative %: 49.2 % (ref 43.0–77.0)
Platelets: 202 10*3/uL (ref 150.0–400.0)
RBC: 4.23 Mil/uL (ref 3.87–5.11)
RDW: 14.3 % (ref 11.5–15.5)
WBC: 8.3 10*3/uL (ref 4.0–10.5)

## 2021-05-20 LAB — TSH: TSH: 2.25 u[IU]/mL (ref 0.35–5.50)

## 2021-05-20 LAB — LIPID PANEL
Cholesterol: 222 mg/dL — ABNORMAL HIGH (ref 0–200)
HDL: 65.6 mg/dL (ref >39.00)
LDL Cholesterol: 140 mg/dL — ABNORMAL HIGH (ref 0–99)
NonHDL: 156.59
Total CHOL/HDL Ratio: 3
Triglycerides: 82 mg/dL (ref 0.0–149.0)
VLDL: 16.4 mg/dL (ref 0.0–40.0)

## 2021-05-20 NOTE — Patient Instructions (Addendum)
Please stop by lab before you go ?If you have mychart- we will send your results within 3 business days of [place] receiving them.  ?If you do not have mychart- we will call you about results within 5 business days of [place] receiving them.  ?*please also note that you will see labs on mychart as soon as they post. I will later go in and write notes on them- will say "notes from Dr. Yong Channel"  ? ?Team please abstract mammogram from this year- not sure why its not registering in health maintenance ? ?Please check with your pharmacy to see if they have the shingrix vaccine. If they do- please get this immunization and update [place] by phone call or mychart with dates you receive the vaccine ? ? ?Recommended follow up: Return in about 6 months (around 11/20/2021) for followup or sooner if needed.Schedule b4 you leave. ?Definitely a year for physical ?

## 2021-05-20 NOTE — Progress Notes (Signed)
?Phone [phone] ?  ?Subjective:  ?Patient presents today for their annual physical. Chief complaint-noted.  ? ?See problem oriented charting- ?ROS- full  review of systems was completed and negative per full ROs sheet- neck pain much much improved- doing home PT from Dr. Jean RosenthalJackson ? ?The following were reviewed and entered/updated in epic: ?Past Medical History:  ?Diagnosis Date  ? Hot flashes   ? Hypertension   ? LYMPHOPENIA 05/04/2007  ? saw oncology- later normalized. ? virus related  ? RHINITIS 12/03/2009  ? ?Patient Active Problem List  ? Diagnosis Date Noted  ? Hyperlipidemia 07/10/2020  ?  Priority: Medium   ? Anxiety 05/12/2018  ?  Priority: Medium   ? Hypothyroidism 05/12/2018  ?  Priority: Medium   ? Hypertension 11/13/2005  ?  Priority: Medium   ? Loud snoring 05/12/2021  ?  Priority: Low  ? Nasal deviation 05/12/2021  ?  Priority: Low  ? 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  Priority: Low  ? 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  Priority: Low  ? Right epiretinal membrane 03/04/2021  ?  Priority: Low  ? Plantar fasciitis of left foot 05/14/2017  ?  Priority: Low  ? Caregiver burden 12/11/2016  ?  Priority: Low  ? Former smoker 12/28/2015  ?  Priority: Low  ? Hot flash, menopausal 06/29/2014  ?  Priority: Low  ? Allergic rhinitis 12/03/2009  ?  Priority: Low  ? ?Past Surgical History:  ?Procedure Laterality Date  ? CARPAL TUNNEL RELEASE    ? right side. Dr. Amanda PeaGramig  ? CESAREAN SECTION    ? x2  ? ? ?Family History  ?Problem Relation Age of Onset  ? Breast cancer Sister   ? Hypertension Mother   ? Stroke Mother   ?     in 2790s  ? Heart failure Father   ? Hypertension Father   ? Hyperlipidemia Father   ? ? ?Medications- reviewed and updated ?Current Outpatient Medications  ?Medication Sig Dispense Refill  ? busPIRone (BUSPAR) 5 MG tablet Take 1 tablet (5 mg total) by mouth 2 (two) times daily as needed. 180 tablet 3  ? Cholecalciferol (VITAMIN D3 PO) Take 5,000 Units by mouth daily.     ? cyclobenzaprine (FLEXERIL) 5 MG tablet Take 1 tablet (5 mg total) by mouth at bedtime as needed for muscle spasms. 30 tablet 1  ? levothyroxine (SYNTHROID) 50 MCG tablet Take one tab every morning 90 tablet 3  ? losartan-hydrochlorothiazide (HYZAAR) 100-25 MG tablet TAKE 1 TABLET BY MOUTH  DAILY 90 tablet 3  ? MAGNESIUM CITRATE PO Take by mouth.    ? ?No current facility-administered medications for this visit.  ? ? ?Allergies-reviewed and updated ?No Known Allergies ? ?Social History  ? ?Social History Narrative  ? Married husband Richard patient of Dr. Durene CalHunter. 2 children. No grandkids- has granddogs  ?   ? 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   ? Hobbies: a lot of this time is taken up caring for her parents, does go out with friends, some entertaining, vacation once a year  ? ?Objective  ?Objective:  ?BP 118/72   Pulse 81   Temp 98.6 ?F (37 ?C) (Temporal)   Ht 5\' 2"  (1.575 m)   Wt 166 lb 6 oz (75.5 kg)   SpO2 97%   BMI 30.43 kg/m?  ?Gen: NAD, resting comfortably ?HEENT: Mucous membranes are moist. Oropharynx normal ?Neck: no thyromegaly ?CV:  RRR no murmurs rubs or gallops ?Lungs: CTAB no crackles, wheeze, rhonchi ?Abdomen: soft/nontender/nondistended/normal bowel sounds. No rebound or guarding.  ?Ext: no edema ?Skin: warm, dry ?Neuro: grossly normal, moves all extremities, PERRLA ?  ?Assessment and Plan  ? ?66 y.o. female presenting for annual physical.  ?Health Maintenance counseling: ?1. Anticipatory guidance: Patient counseled regarding regular dental exams -q6 months, eye exams - yearly,  avoiding smoking and second hand smoke , limiting alcohol to 1 beverage per day, no illicit drugs .   ?2. Risk factor reduction:  Advised patient of need for regular exercise and diet rich and fruits and vegetables to reduce risk of heart attack and stroke.  ?Exercise- held back by neck issues recently.  ?Diet/weight management-up slightly from last June- working on improving diet  challenging as already eats healthy.  ?Wt Readings from Last 3 Encounters:  ?05/20/21 166 lb 6 oz (75.5 kg)  ?05/12/21 169 lb 3.2 oz (76.7 kg)  ?05/08/21 167 lb (75.8 kg)  ?3. Immunizations/screenings/ancillary studies- wants to hold off for now- had covid a year ago. Shingrix at pharmacy- wants to do prevnar next year ?Immunization History  ?Administered Date(s) Administered  ? PFIZER(Purple Top)SARS-COV-2 Vaccination 04/29/2019, 05/22/2019, 11/21/2019  ? Tdap 07/14/2016  ?4. Cervical cancer screening- also had pap recently reassuring  ?5. Breast cancer screening-  breast exam with GYn and mammogram - signed ROI at their office Dr. Arelia Sneddon- false positive but follow up OK ?6. Colon cancer screening - 08/22/2015 with 10 year repeat ?7. Skin cancer screening- not seeing derm- considering. advised regular sunscreen use. Denies worrisome, changing, or new skin lesions.  ?8. Birth control/STD check- monogamous/ postmenopausal ?9. Osteoporosis screening at 56- with GYN- trying to get records ?10. Smoking associated screening - former smoker- quit 1995- no regular screening required- opts out of urine ? ?Status of chronic or acute concerns  ? ?#hypertension ?S: medication: losartan Hydrochlorothiazide 100-25 mg daily ?Home readings #s: occasionally checks and similar ?A/P: Controlled. Continue current medications.  ? ?#hyperlipidemia ?S: Medication:None ?Lab Results  ?Component Value Date  ? CHOL 272 (A) 06/24/2020  ? HDL 64 06/24/2020  ? LDLCALC 180 06/24/2020  ? TRIG 153 06/24/2020  ? CHOLHDL 4 09/08/2018  ? A/P: The 10-year ASCVD risk score (Arnett DK, et al., 2019) is: 7.7%.  ?-interested in ct cardiac scoring- will go ahead and refer and can cancel if numbers come down drastically ? ?#hypothyroidism ?S: compliant On thyroid medication-levothyroxine 50 mcg daily ?A/P:hopefully stable- update tsh today. Continue current meds for now ?  ? # Anxiety/GAD ?S:Medication: buspirone 5 mg twice daily as needed- using maybe 1-2x  a month ?-she stopped lexapro from GYN at 10 mg and doing reasonably well ?A/P: overall improved- right around a year from loss of mom  - continue sparing buspirone alone ?  ?#Snoring-undergoing evaluation for OSA  (4-6 week wait for home sleep test)and also referred to ENT for nasal deviation May 2023- using breathe right strips.   ? ?Recommended follow up: Return in about 6 months (around 11/20/2021) for followup or sooner if needed.Schedule b4 you leave. ?Future Appointments  ?Date Time Provider Department Center  ?06/05/2021  1:30 PM Rankin, Alford Highland, MD [facility] None  ?06/06/2021 11:00 AM Richardean Sale, DO [facility] None  ? ?Lab/Order associations: Not fasting ?  ICD-10-CM   ?1. Preventative health care  Z00.00   ?  ?2. Hypothyroidism, unspecified type  E03.9 TSH  ?  ?3. Primary hypertension  I10   ?  ?4. Hyperlipidemia, unspecified hyperlipidemia  type  E78.5 CBC with Differential/Platelet  ?  Comprehensive metabolic panel  ?  Lipid panel  ?  CT CARDIAC SCORING (SELF PAY ONLY)  ?  ?5. Anxiety  F41.9   ?  ? ? ?No orders of the defined types were placed in this encounter. ? ? ?Return precautions advised.  ?Tana Conch, MD ? ? ?

## 2021-06-03 NOTE — Progress Notes (Signed)
Dana Dillon D.[facility] [address] [place] [address] Phone: [phone]   Assessment and Plan:     1. Neck pain 2. DDD (degenerative disc disease), cervical -Chronic with improvement, subsequent visit - Significant improvement in generalized neck pain with intermittent use of NSAIDs and muscle relaxers, daily home exercise program - Continue HEP for neck - May continue NSAIDs as needed for day-to-day pain relief   Pertinent previous records reviewed include none   Follow Up: As needed if no improvement or worsening of symptoms.  Patient mentioned that she had a mild flare of right shoulder pain.  HEP was provided for right shoulder, and patient agreed to follow-up if right shoulder pain does not resolve   Subjective:   I, Dana Dillon, am serving as a Neurosurgeon for Doctor Richardean Sale   Chief Complaint: left sided neck pain    HPI:    04/17/2021 Patient is a 66 year old female complaining of left sided neck pain. Patient states that she has always had left sided neck pain, 2 weeks ago she wasn't able to turn it , heating pad, 4 motrin, muscle relaxer have helped, tight upper trap and neck , sometimes numbness and tingling cam sleep through the night sometimes , uses and ergonomic pillow, only radiates to the upper trap, she tries to work out at home gym when she does lat pull down that causes flare up, wants to discuss OMT ,    05/08/2021 Patient states that she is good , is sore for days on end after HEP , still tight in the upper back mostly the right but she is able to move her neck    06/06/2021 Patient states that her neck feels wonderful, she did injured her right shoulder , is icing motrin flaring up today will follow up if she needs to in clinic    Relevant Historical Information: Hypertension, hypothyroidism  Additional pertinent review of systems negative.   Current Outpatient Medications:    busPIRone (BUSPAR) 5 MG  tablet, Take 1 tablet (5 mg total) by mouth 2 (two) times daily as needed., Disp: 180 tablet, Rfl: 3   Cholecalciferol (VITAMIN D3 PO), Take 5,000 Units by mouth daily., Disp: , Rfl:    cyclobenzaprine (FLEXERIL) 5 MG tablet, Take 1 tablet (5 mg total) by mouth at bedtime as needed for muscle spasms., Disp: 30 tablet, Rfl: 1   levothyroxine (SYNTHROID) 50 MCG tablet, Take one tab every morning, Disp: 90 tablet, Rfl: 3   losartan-hydrochlorothiazide (HYZAAR) 100-25 MG tablet, TAKE 1 TABLET BY MOUTH  DAILY, Disp: 90 tablet, Rfl: 3   MAGNESIUM CITRATE PO, Take by mouth., Disp: , Rfl:    Objective:     Vitals:   06/06/21 1055  BP: 130/84  Pulse: (!) 116  SpO2: 99%  Weight: 166 lb (75.3 kg)  Height: 5\' 2"  (1.575 m)      Body mass index is 30.36 kg/m.    Physical Exam:    Cervical Spine: Posture normal Skin: normal, intact  Neurological:   Strength:  Right  Left   Deltoid 5/5 5/5  Bicep 5/5  5/5  Tricep 5/5 5/5  Wrist Flexion 5/5 5/5  Wrist Extension 5/5 5/5  Grip 5/5 5/5  Finger Abduction 5/5 5/5   Sensation: intact to light touch in upper extremities bilaterally  Spurling's:  negative bilaterally Neck ROM: Full active ROM   NTTP: cervical spinous processes, cervical paraspinal, thoracic paraspinal, trapezius    Electronically  signed by:  Dana Dillon D.[facility] 11:10 AM 06/06/21

## 2021-06-05 ENCOUNTER — Encounter (INDEPENDENT_AMBULATORY_CARE_PROVIDER_SITE_OTHER): Payer: Self-pay | Admitting: Ophthalmology

## 2021-06-05 ENCOUNTER — Ambulatory Visit (INDEPENDENT_AMBULATORY_CARE_PROVIDER_SITE_OTHER): Payer: Medicare Other | Admitting: Ophthalmology

## 2021-06-05 DIAGNOSIS — H35371 Puckering of macula, right eye: Secondary | ICD-10-CM | POA: Diagnosis not present

## 2021-06-05 DIAGNOSIS — H35021 Exudative retinopathy, right eye: Secondary | ICD-10-CM

## 2021-06-05 DIAGNOSIS — H35351 Cystoid macular degeneration, right eye: Secondary | ICD-10-CM

## 2021-06-05 DIAGNOSIS — H43811 Vitreous degeneration, right eye: Secondary | ICD-10-CM

## 2021-06-05 DIAGNOSIS — H2513 Age-related nuclear cataract, bilateral: Secondary | ICD-10-CM | POA: Diagnosis not present

## 2021-06-05 NOTE — Assessment & Plan Note (Signed)
Minor OD 

## 2021-06-05 NOTE — Assessment & Plan Note (Signed)
OD confirmed improvement today on no specific topical or periocular therapy but coincident with enhanced breathing using nasal passageway opening techniques such as allergy medications.  Patient also found to have a deviated septum and he measures to confirm she actually has an open airway nightly  Patient also has a planned sleep study May 30 to look for signs of sleep apnea

## 2021-06-05 NOTE — Progress Notes (Signed)
06/05/2021     CHIEF COMPLAINT Patient presents for  Chief Complaint  Patient presents with   Retina Follow Up      HISTORY OF PRESENT ILLNESS: Dana Dillon is a 66 y.o. female who presents to the clinic today for:   HPI     Retina Follow Up           Diagnosis: Other   Laterality: right eye   Severity: moderate   Course: gradually improving         Comments   6 weeks for DILATE OD, COLOR FP, consider local focal laser, OD. Pt reports vision has improved since last visit.   Pt stated she has a sleep study on May 30th. Pt reports that she went to [facility] and was informed that she has a deviated septum. Pt denies FOL but states that right eye still has a floater.       Last edited by Silvestre Moment on 06/05/2021  1:38 PM.      Referring physician: Greta Doom, OD [address] [place],  [place] [address]  HISTORICAL INFORMATION:   Selected notes from the MEDICAL RECORD NUMBER       CURRENT MEDICATIONS: No current outpatient medications on file. (Ophthalmic Drugs)   No current facility-administered medications for this visit. (Ophthalmic Drugs)   Current Outpatient Medications (Other)  Medication Sig   busPIRone (BUSPAR) 5 MG tablet Take 1 tablet (5 mg total) by mouth 2 (two) times daily as needed.   Cholecalciferol (VITAMIN D3 PO) Take 5,000 Units by mouth daily.   cyclobenzaprine (FLEXERIL) 5 MG tablet Take 1 tablet (5 mg total) by mouth at bedtime as needed for muscle spasms.   levothyroxine (SYNTHROID) 50 MCG tablet Take one tab every morning   losartan-hydrochlorothiazide (HYZAAR) 100-25 MG tablet TAKE 1 TABLET BY MOUTH  DAILY   MAGNESIUM CITRATE PO Take by mouth.   No current facility-administered medications for this visit. (Other)      REVIEW OF SYSTEMS: ROS   Negative for: Constitutional, Gastrointestinal, Neurological, Skin, Genitourinary, Musculoskeletal, HENT, Endocrine, Cardiovascular, Eyes, Respiratory,  Psychiatric, Allergic/Imm, Heme/Lymph Last edited by Silvestre Moment on 06/05/2021  1:38 PM.       ALLERGIES No Known Allergies  PAST MEDICAL HISTORY Past Medical History:  Diagnosis Date   Hot flashes    Hypertension    LYMPHOPENIA 05/04/2007   saw oncology- later normalized. ? virus related   RHINITIS 12/03/2009   Past Surgical History:  Procedure Laterality Date   CARPAL TUNNEL RELEASE     right side. Dr. Amedeo Plenty   CESAREAN SECTION     x2    FAMILY HISTORY Family History  Problem Relation Age of Onset   Breast cancer Sister    Hypertension Mother    Stroke Mother        in 75s   Heart failure Father    Hypertension Father    Hyperlipidemia Father     SOCIAL HISTORY Social History   Tobacco Use   Smoking status: Former    Packs/day: 1.00    Years: 15.00    Pack years: 15.00    Types: Cigarettes    Quit date: 05/20/1993    Years since quitting: 28.0   Smokeless tobacco: Never  Substance Use Topics   Alcohol use: Yes    Alcohol/week: 7.0 standard drinks    Types: 7 drink(s) per week   Drug use: No         OPHTHALMIC  EXAM:  Base Eye Exam     Visual Acuity (ETDRS)       Right Left   Dist [place] 20/50 20/20 -1   Dist ph [place] 20/30 -1 +2          Tonometry (Tonopen, 1:42 PM)       Right Left   Pressure 19 21         Pupils       Pupils APD   Right PERRL None   Left PERRL None         Visual Fields       Left Right    Full Full         Extraocular Movement       Right Left    Full Full         Neuro/Psych     Oriented x3: Yes   Mood/Affect: Normal         Dilation     Right eye: 2.5% Phenylephrine, 1.0% Mydriacyl @ 1:42 PM           Slit Lamp and Fundus Exam     External Exam       Right Left   External Normal Normal         Slit Lamp Exam       Right Left   Lids/Lashes Normal Normal   Conjunctiva/Sclera White and quiet White and quiet   Cornea Clear Clear   Anterior Chamber Deep and quiet Deep and  quiet   Iris Round and reactive Round and reactive   Lens 1+ Nuclear sclerosis 1+ Nuclear sclerosis   Anterior Vitreous Normal Normal         Fundus Exam       Right Left   Posterior Vitreous Posterior vitreous detachment    Disc Normal    C/D Ratio 0.25    Macula Epiretinal membrane mild topographic distortion    Vessels Normal exteriorly and peripherally except over the lesion described under the periphery    Periphery Region centered at 5:30 position anterior to the equator and extending to the ora serrata with the posterior edge of white subretinal fibrosis rimmed by hyperpigmented RPE changes.  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 Disc findings include normal observations. Macula : normal observations. Vessels : normal observations.   Left Eye Progression has no prior data. Disc findings include normal observations. Macula : normal observations. Vessels : normal observations. Periphery : normal observations.   Notes OD, inferonasal, 530 meridian over the posterior edge of hyperpigmentation and subretinal fibrosis in the exudative lesions seen peripherally on clinical examination is notable.  We will need fluorescein angiography with lid retraction neck so as to identify an underlying vascular etiology if present     OCT, Retina - OU - Both Eyes       Right Eye Quality was good. Central Foveal Thickness: 359. Progression has no prior data. Findings include epiretinal membrane, abnormal foveal contour.   Left Eye Quality was good. Scan locations included subfoveal. Central Foveal Thickness: 280. Progression has no prior data. Findings include  normal foveal contour.  Notes OD with foveal macular schisis and and disruption of the outer photoreceptor layer but now spontaneously improved coincident with potentially enhanced oxygenation with treatment of the nasal allergies.  Sleep study still pending  Centrally above the center of the fovea.,  Associate with overlying epiretinal membrane.             ASSESSMENT/PLAN:  Posterior vitreous detachment of right eye Physiologic yet with floater, Mariel Kansky ring in the visual axis symptomatically more pronounced with the cataracts present  Nuclear sclerotic cataract of both eyes May need refraction now with Dr. Dorena Cookey  Cystoid macular edema of right eye OD confirmed improvement today on no specific topical or periocular therapy but coincident with enhanced breathing using nasal passageway opening techniques such as allergy medications.  Patient also found to have a deviated septum and he measures to confirm she actually has an open airway nightly  Patient also has a planned sleep study May 30 to look for signs of sleep apnea  Exudative retinopathy, right eye Peripheral exudative lesion inferiorly surrounded by pigmentary scarring and white fibrotic scarring anterior to the equator centered at 536 meridian.  No enlargement of this area, no extension this area and with the findings of scarring, suggest chronicity.  We will opt to monitor but this by observation alone since ablative laser therapy could in fact trigger hemorrhage or and/or other symptomatic issues.  In the absence of symptoms we will continue to observe but closely  Right epiretinal membrane Minor OD     ICD-10-CM   1. Exudative retinopathy, right eye  H35.021 Color Fundus Photography Optos - OU - Both Eyes    OCT, Retina - OU - Both Eyes    2. Cystoid macular edema, right eye  H35.351 OCT, Retina - OU - Both Eyes    3. Nuclear sclerotic cataract of both eyes  H25.13     4. Posterior vitreous detachment of  right eye  H43.811     5. Cystoid macular edema of right eye  H35.351     6. Right epiretinal membrane  H35.371       1.  OD will continue to monitor exudative lesion inferiorly.  No extension no worsening of this condition  2.  OD perimacular CME has improved.  Is this coincident with improved oxygenation at night?  3.  Patient has sleep study upcoming to look for signs that might trigger MAC-TEL such as nightly hypoxic stress from untreated and undiagnosed sleep apnea?  4.  Nuclear sclerotic cataract changes in each eye.  With refractive error OD.  Patient should follow-up with Dr. Dorena Cookey for refraction  Ophthalmic Meds Ordered this visit:  No orders of the defined types were placed in this encounter.      Return in about 4 months (around 10/06/2021) for COLOR FP, OCT, dilate, OD.  There are no Patient Instructions on file for this visit.   Explained the diagnoses, plan, and follow up with the patient and they expressed understanding.  Patient expressed understanding of the importance of proper follow up care.   Clent Demark Devrin Monforte M.D. Diseases & Surgery of the Retina and Vitreous [facility] [place] 06/05/21     Abbreviations: M myopia (nearsighted); A astigmatism; H hyperopia (farsighted); P presbyopia; Mrx spectacle prescription;  CTL contact lenses; OD right eye; OS left eye; OU both eyes  XT exotropia; ET esotropia; PEK punctate epithelial keratitis; PEE punctate epithelial erosions; DES dry eye syndrome; MGD meibomian gland dysfunction; ATs artificial tears;  PFAT's preservative free artificial tears; [place] nuclear sclerotic cataract; PSC posterior subcapsular cataract; ERM epi-retinal membrane; PVD posterior vitreous detachment; RD retinal detachment; DM diabetes mellitus; DR diabetic retinopathy; NPDR non-proliferative diabetic retinopathy; PDR proliferative diabetic retinopathy; CSME clinically significant macular edema; DME diabetic macular edema; dbh dot blot  hemorrhages; CWS cotton wool spot; POAG primary open angle glaucoma; C/D cup-to-disc ratio; HVF humphrey visual field; GVF goldmann visual field; OCT optical coherence tomography; IOP intraocular pressure; BRVO Branch retinal vein occlusion; CRVO central retinal vein occlusion; CRAO central retinal artery occlusion; BRAO branch retinal artery occlusion; RT retinal tear; SB scleral buckle; PPV pars plana vitrectomy; VH Vitreous hemorrhage; PRP panretinal laser photocoagulation; IVK intravitreal kenalog; VMT vitreomacular traction; MH Macular hole;  NVD neovascularization of the disc; NVE neovascularization elsewhere; AREDS age related eye disease study; ARMD age related macular degeneration; POAG primary open angle glaucoma; EBMD epithelial/anterior basement membrane dystrophy; ACIOL anterior chamber intraocular lens; IOL intraocular lens; PCIOL posterior chamber intraocular lens; Phaco/IOL phacoemulsification with intraocular lens placement; [place] photorefractive keratectomy; LASIK laser assisted in situ keratomileusis; HTN hypertension; DM diabetes mellitus; COPD chronic obstructive pulmonary disease

## 2021-06-05 NOTE — Assessment & Plan Note (Signed)
Physiologic yet with floater, Dana Dillon ring in the visual axis symptomatically more pronounced with the cataracts present

## 2021-06-05 NOTE — Assessment & Plan Note (Signed)
Peripheral exudative lesion inferiorly surrounded by pigmentary scarring and white fibrotic scarring anterior to the equator centered at 536 meridian.  No enlargement of this area, no extension this area and with the findings of scarring, suggest chronicity.  We will opt to monitor but this by observation alone since ablative laser therapy could in fact trigger hemorrhage or and/or other symptomatic issues.  In the absence of symptoms we will continue to observe but closely

## 2021-06-05 NOTE — Assessment & Plan Note (Signed)
May need refraction now with Dr. Dorena Cookey

## 2021-06-06 ENCOUNTER — Ambulatory Visit: Payer: Medicare Other | Admitting: Sports Medicine

## 2021-06-06 VITALS — BP 130/84 | HR 116 | Ht 62.0 in | Wt 166.0 lb

## 2021-06-06 DIAGNOSIS — M542 Cervicalgia: Secondary | ICD-10-CM | POA: Diagnosis not present

## 2021-06-06 DIAGNOSIS — M5030 Other cervical disc degeneration, unspecified cervical region: Secondary | ICD-10-CM | POA: Diagnosis not present

## 2021-06-06 NOTE — Patient Instructions (Addendum)
Good to see you  Continue neck HEP  Shoulder ROM  As needed follow up

## 2021-06-10 ENCOUNTER — Ambulatory Visit: Payer: Medicare Other

## 2021-06-10 DIAGNOSIS — R0683 Snoring: Secondary | ICD-10-CM

## 2021-06-10 DIAGNOSIS — G4733 Obstructive sleep apnea (adult) (pediatric): Secondary | ICD-10-CM | POA: Diagnosis not present

## 2021-06-18 DIAGNOSIS — G4733 Obstructive sleep apnea (adult) (pediatric): Secondary | ICD-10-CM | POA: Diagnosis not present

## 2021-06-25 ENCOUNTER — Other Ambulatory Visit: Payer: Self-pay | Admitting: Family Medicine

## 2021-06-26 ENCOUNTER — Other Ambulatory Visit: Payer: Self-pay | Admitting: Family Medicine

## 2021-07-02 ENCOUNTER — Telehealth (INDEPENDENT_AMBULATORY_CARE_PROVIDER_SITE_OTHER): Payer: Medicare Other | Admitting: Primary Care

## 2021-07-02 DIAGNOSIS — M950 Acquired deformity of nose: Secondary | ICD-10-CM

## 2021-07-02 DIAGNOSIS — G4733 Obstructive sleep apnea (adult) (pediatric): Secondary | ICD-10-CM | POA: Diagnosis not present

## 2021-07-02 NOTE — Progress Notes (Signed)
Virtual Visit via Video Note  I connected with Dana Dillon on 07/02/21 at  3:30 PM EDT by a video enabled telemedicine application and verified that I am speaking with the correct person using two identifiers.  Location: Patient: Home Provider: Office    I discussed the limitations of evaluation and management by telemedicine and the availability of in person appointments. The patient expressed understanding and agreed to proceed.  History of Present Illness: 66 year old female, former smoker. PMH hypothyroidism, HTN, allergic rhinitis, hyperlipidemia, former smoker.   Previous [facility] pulmonary encounter: 05/12/2021 Patient presents today for sleep consult. She has symptoms of loud snoring, occasional daytime sleepiness and restless sleep. Symptoms have been on and off her whole life, particularly worse after menopause. She has difficulty breathing out her nose. She has noticed left nasal passage is blocked. She uses flonse and afrin as needed along with breath right strip. She has a fluid leak behind right eye causing blurry vision , her ophthalmologist told her this can be caused by sleep apnea. She has not been evaluated by ENT. She is open to surgical options if needed.   Sleep questionnaire Symptoms-  Loud snoring, restless sleep and daytime sleepiness Prior sleep study- None Bedtime-9:30 Time to fall asleep- 15 mins Nocturnal awakenings- varies, 1-4 times  Out of bed/start of day- 6-6:30 or 8-830 Weight changes-  5lb up  Do you operate heavy machinery- No Do you currently wear CPAP- No Do you current wear oxygen- No  Epworth- 7   07/02/2021- Interim Patient contacted today for virtual video visit to review home sleep study results.  She was originally referred for sleep consult d/t blurred vision and fluid accumulation behind right eye. Opthalmology suspected she may have sleep apnea. Sleep study on 06/10/21 showed evidence of moderate obstructive sleep apnea.  She had a total of 125  apneic and hypopneic events. Total apnea/hypopnea index (AHI) 22.6/hr with SPO2 low 86% (94%). She has symptoms of snoring along with chronic nasal congestion and difficulty breathing through her nasal passages.  She has nasal deviation and is interested in surgical options if any. She has an apt with ENT in July. She started wearing breath right strips at night and reports vision improved in her right eye.     Observations/Objective:  Appears well; No overt resp symptoms   Assessment and Plan:  Moderate OSA - Patient has symptoms of loud snoring, restless sleep, daytime sleepiness and blurred vision  - Home sleep study 06/10/21 showed evidence of moderate obstructive sleep apnea, AHI 22.6 an hour with SPO2 low 86% (average 94%) - Referring to ENT for upper airway exam for possible surgical options.  If none would recommend either oral appliance or auto CPAP at that point.   Nasal deviation - Chronic nasal congestion/difficulty breathing through left nostril d/t nasal deviation  - Patient has an apt with ENT in July  Follow Up Instructions:  3 months with Waynetta Sandy NP  I discussed the assessment and treatment plan with the patient. The patient was provided an opportunity to ask questions and all were answered. The patient agreed with the plan and demonstrated an understanding of the instructions.   The patient was advised to call back or seek an in-person evaluation if the symptoms worsen or if the condition fails to improve as anticipated.  I provided 22 minutes of non-face-to-face time during this encounter.   Glenford Bayley, NP

## 2021-07-02 NOTE — Patient Instructions (Signed)
Home sleep study showed evidence of moderate obstructive sleep apnea, AHI 22.6 an hour with SPO2 low 86% (average 94%)  Recommend ENT evaluation for possible surgical options, if none then would advise we start CPAP therapy   Continue to use Breathe Right strips.  Focus on side sleeping position or elevate head of bed 30 degrees  Follow-up 3 months with Beth NP   Sleep Apnea Sleep apnea affects breathing during sleep. It causes breathing to stop for 10 seconds or more, or to become shallow. People with sleep apnea usually snore loudly. It can also increase the risk of: Heart attack. Stroke. Being very overweight (obese). Diabetes. Heart failure. Irregular heartbeat. High blood pressure. The goal of treatment is to help you breathe normally again. What are the causes?  The most common cause of this condition is a collapsed or blocked airway. There are three kinds of sleep apnea: Obstructive sleep apnea. This is caused by a blocked or collapsed airway. Central sleep apnea. This happens when the brain does not send the right signals to the muscles that control breathing. Mixed sleep apnea. This is a combination of obstructive and central sleep apnea. What increases the risk? Being overweight. Smoking. Having a small airway. Being older. Being female. Drinking alcohol. Taking medicines to calm yourself (sedatives or tranquilizers). Having family members with the condition. Having a tongue or tonsils that are larger than normal. What are the signs or symptoms? Trouble staying asleep. Loud snoring. Headaches in the morning. Waking up gasping. Dry mouth or sore throat in the morning. Being sleepy or tired during the day. If you are sleepy or tired during the day, you may also: Not be able to focus your mind (concentrate). Forget things. Get angry a lot and have mood swings. Feel sad (depressed). Have changes in your personality. Have less interest in sex, if you are  female. Be unable to have an erection, if you are female. How is this treated?  Sleeping on your side. Using a medicine to get rid of mucus in your nose (decongestant). Avoiding the use of alcohol, medicines to help you relax, or certain pain medicines (narcotics). Losing weight, if needed. Changing your diet. Quitting smoking. Using a machine to open your airway while you sleep, such as: An oral appliance. This is a mouthpiece that shifts your lower jaw forward. A CPAP device. This device blows air through a mask when you breathe out (exhale). An EPAP device. This has valves that you put in each nostril. A BIPAP device. This device blows air through a mask when you breathe in (inhale) and breathe out. Having surgery if other treatments do not work. Follow these instructions at home: Lifestyle Make changes that your doctor recommends. Eat a healthy diet. Lose weight if needed. Avoid alcohol, medicines to help you relax, and some pain medicines. Do not smoke or use any products that contain nicotine or tobacco. If you need help quitting, ask your doctor. General instructions Take over-the-counter and prescription medicines only as told by your doctor. If you were given a machine to use while you sleep, use it only as told by your doctor. If you are having surgery, make sure to tell your doctor you have sleep apnea. You may need to bring your device with you. Keep all follow-up visits. Contact a doctor if: The machine that you were given to use during sleep bothers you or does not seem to be working. You do not get better. You get worse. Get help right away  if: Your chest hurts. You have trouble breathing in enough air. You have an uncomfortable feeling in your back, arms, or stomach. You have trouble talking. One side of your body feels weak. A part of your face is hanging down. These symptoms may be an emergency. Get help right away. Call your local emergency services (911 in the  [place] Do not wait to see if the symptoms will go away. Do not drive yourself to the hospital. Summary This condition affects breathing during sleep. The most common cause is a collapsed or blocked airway. The goal of treatment is to help you breathe normally while you sleep. This information is not intended to replace advice given to you by your health care provider. Make sure you discuss any questions you have with your health care provider. Document Revised: 08/07/2020 Document Reviewed: 12/08/2019 Elsevier Patient Education  2023 [employer].

## 2021-07-08 ENCOUNTER — Encounter: Payer: Self-pay | Admitting: Family Medicine

## 2021-07-08 ENCOUNTER — Ambulatory Visit
Admission: RE | Admit: 2021-07-08 | Discharge: 2021-07-08 | Disposition: A | Discharge status: Home / Self Care | Payer: Self-pay | Source: Ambulatory Visit | Attending: Family Medicine | Admitting: Family Medicine

## 2021-07-08 DIAGNOSIS — E785 Hyperlipidemia, unspecified: Secondary | ICD-10-CM

## 2021-07-08 DIAGNOSIS — I700 Atherosclerosis of aorta: Secondary | ICD-10-CM | POA: Insufficient documentation

## 2021-07-21 ENCOUNTER — Ambulatory Visit (INDEPENDENT_AMBULATORY_CARE_PROVIDER_SITE_OTHER): Payer: Medicare Other | Admitting: Family Medicine

## 2021-07-21 ENCOUNTER — Encounter: Payer: Self-pay | Admitting: Family Medicine

## 2021-07-21 VITALS — BP 120/82 | HR 72 | Temp 97.7°F | Ht 62.0 in | Wt 165.0 lb

## 2021-07-21 DIAGNOSIS — M25511 Pain in right shoulder: Secondary | ICD-10-CM | POA: Diagnosis not present

## 2021-07-21 DIAGNOSIS — I700 Atherosclerosis of aorta: Secondary | ICD-10-CM

## 2021-07-21 DIAGNOSIS — I10 Essential (primary) hypertension: Secondary | ICD-10-CM

## 2021-07-21 DIAGNOSIS — M85851 Other specified disorders of bone density and structure, right thigh: Secondary | ICD-10-CM | POA: Diagnosis not present

## 2021-07-21 MED ORDER — ROSUVASTATIN CALCIUM 10 MG PO TABS: 10.0000 mg | ORAL_TABLET | Freq: Every day | ORAL | 5 refills | Status: DC

## 2021-07-21 NOTE — Progress Notes (Signed)
Phone [phone] In person visit   Subjective:   Dana Dillon is a 66 y.o. year old very pleasant female patient who presents for/with See problem oriented charting Chief Complaint  Patient presents with   Shoulder Pain    Pt c/o right shoulder pain that started in June, it has improved unless with movement.    dexa    Pt would like to review dexa results.    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    Priority: Low   Right epiretinal membrane 03/04/2021    Priority: Low   Plantar fasciitis of left foot 05/14/2017    Priority: Low   Caregiver burden 12/11/2016    Priority: Low   Former smoker 12/28/2015    Priority: Low   Hot flash, menopausal 06/29/2014    Priority: Low   Allergic rhinitis 12/03/2009    Priority: Low   Osteopenia of right femoral neck 07/21/2021   Nuclear sclerotic cataract of both eyes 06/05/2021   Posterior vitreous detachment of right eye 06/05/2021    Medications- reviewed and updated Current Outpatient Medications  Medication Sig Dispense Refill   busPIRone (BUSPAR) 5 MG tablet Take 1 tablet (5 mg total) by mouth 2 (two) times daily as needed. 180 tablet 3   Cholecalciferol (VITAMIN D3 PO) Take 5,000 Units by mouth daily.     cyclobenzaprine (FLEXERIL) 5 MG tablet Take 1 tablet (5 mg total) by mouth at bedtime as needed for muscle spasms. 30 tablet 1   levothyroxine (SYNTHROID) 50 MCG tablet TAKE 1 TABLET BY MOUTH IN  THE MORNING 90 tablet 3    losartan-hydrochlorothiazide (HYZAAR) 100-25 MG tablet TAKE 1 TABLET BY MOUTH  DAILY 90 tablet 3   MAGNESIUM CITRATE PO Take by mouth.     rosuvastatin (CRESTOR) 10 MG tablet Take 1 tablet (10 mg total) by mouth daily. 30 tablet 5   No current facility-administered medications for this visit.     Objective:  BP 120/82   Pulse 72   Temp 97.7 F (36.5 C)   Ht 5\' 2"  (1.575 m)   Wt 165 lb (74.8 kg)   SpO2 100%   BMI 30.18 kg/m  Gen: NAD, resting comfortably CV: RRR no murmurs rubs or gallops Lungs: CTAB no crackles, wheeze, rhonchi Ext: no edema Skin: warm, dry With right shoulder some pain with active external rotation of the shoulder.  Also with positive empty can, Neer, Hawkins test-also somewhat tender over bicipital groove    Assessment and Plan   #hypertension S: medication: losartan Hydrochlorothiazide 100-25 mg daily BP Readings from Last 3 Encounters:  07/21/21 120/82  06/06/21 130/84  05/20/21 118/72  A/P:  Controlled. Continue current medications.   -Discussed if taking Motrin to monitor blood pressure make sure it does not elevate-discussed potential risk   #hyperlipidemia-in 2023 79th percentile CT cardiac scoring at 94.7 #Aortic atherosclerosis S: Medication:None - no pork. No nitrates. Cooks at home  mostly- trying to eat more plant based -she is working up gradually on treadmill up to 32-35 mins. No chest pain or sob with exercise Lab Results  Component Value Date   CHOL 222 (H) 05/20/2021   HDL 65.60 05/20/2021   LDLCALC 140 (H) 05/20/2021   TRIG 82.0 05/20/2021   CHOLHDL 3 05/20/2021   A/P: Poor control in light of coronary artery calcium and aortic atherosclerosis (presumed stable) discussed ideal LDL goal under 70-already doing a good job working on [employer] fast streamer exercise-does not feel like full vegan diet would be the best fit for her-we opted to start rosuvastatin 10 mg-okay to gradually work her way up and can recheck at next visit-we  discussed LDL under 70 does not have to be a short-term goal (discussed potential statin side effects-although you want to review slight diabetes risk at follow-up)   # Low Bone density (formerly osteopenia) S: Last DEXA: 08/24/20 with worst t score right femoral neck -1.4  Last vitamin D adequate Lab Results  Component Value Date   VD25OH 39.05 12/27/2015   A/P: Discussed diagnosis of bone density-reports being followed closely by gynecology-glad she is also working on adding weightbearing exercise  # Right shoulder pain S: patient was doing aggressive spring cleanup about 3 weeks ago or more including painting doors, doing windows. By end of  a week shoulder was very tight- motrin and ice packs helped it loosen up (was essentially frozen up). Does ache and with lifting overhead can bother her- back and form in front of her gets burning in anterior shoulder- does ice pack and  motrin and helps. Pain was 10/10 now down to dull ache 3/10- but can be severe with movement.  -neck is doing much better with Dr. Jean Rosenthal A/P: Suspect bursitis versus rotator cuff tendinitis-it sounds like she had a short-term.  Of greater severity several weeks ago but improving with conservative NSAIDs and ice-can continue these but also referred her to sports medicine for their expert opinion-she preferred to start here other than seeing physical therapy particularly if injection may be beneficial  Recommended follow up: Return for next already scheduled visit or sooner if needed. Future Appointments  Date Time Provider Department Center  10/02/2021 11:30 AM Glenford Bayley, NP [facility] None  10/06/2021  1:30 PM Rankin, Alford Highland, MD [facility] None  11/18/2021 11:00 AM Shelva Majestic, MD [facility]    Lab/Order associations:   ICD-10-CM   1. Acute pain of right shoulder  M25.511 Ambulatory referral to Sports Medicine    2. Osteopenia of right femoral neck  M85.851     3. Primary hypertension  I10     4.  Aortic atherosclerosis (HCC)  I70.0       Meds ordered this encounter  Medications   rosuvastatin (CRESTOR) 10 MG tablet    Sig: Take 1 tablet (10 mg total) by mouth daily.    Dispense:  30 tablet    Refill:  5    Return precautions advised.  Tana Conch, MD

## 2021-07-21 NOTE — Patient Instructions (Addendum)
We will call you within two weeks about your referral to sports medicine. If you do not hear within 2 weeks, give [place] a call.   Ice and short term motrin are ok when flared up- just make sure blood pressure doesn't go up too much within an hour or two of taking the motrin  Start rosuvastatin 10 mg - ok to build up even starting once a week (also ok to wait to start after you seen Dr. Jean Rosenthal and shoulder feeling better)  Recommended follow up: Return for next already scheduled visit or sooner if needed.

## 2021-07-23 DIAGNOSIS — G4733 Obstructive sleep apnea (adult) (pediatric): Secondary | ICD-10-CM | POA: Diagnosis not present

## 2021-07-23 DIAGNOSIS — H6123 Impacted cerumen, bilateral: Secondary | ICD-10-CM | POA: Diagnosis not present

## 2021-07-29 NOTE — Progress Notes (Unsigned)
    [name] [name] [address] [place] [address] Phone: [phone]   Assessment and Plan:     There are no diagnoses linked to this encounter.  ***   Pertinent previous records reviewed include ***   Follow Up: ***     Subjective:   I, Debroah Shuttleworth, am serving as a Neurosurgeon for Doctor Richardean Sale  Chief Complaint: right shoulder pain   HPI:  07/30/2021 Patient is a 66 year old female complaining of right shoulder pain. Patient states  Relevant Historical Information: ***  Additional pertinent review of systems negative.   Current Outpatient Medications:    busPIRone (BUSPAR) 5 MG tablet, Take 1 tablet (5 mg total) by mouth 2 (two) times daily as needed., Disp: 180 tablet, Rfl: 3   Cholecalciferol (VITAMIN D3 PO), Take 5,000 Units by mouth daily., Disp: , Rfl:    cyclobenzaprine (FLEXERIL) 5 MG tablet, Take 1 tablet (5 mg total) by mouth at bedtime as needed for muscle spasms., Disp: 30 tablet, Rfl: 1   levothyroxine (SYNTHROID) 50 MCG tablet, TAKE 1 TABLET BY MOUTH IN  THE MORNING, Disp: 90 tablet, Rfl: 3   losartan-hydrochlorothiazide (HYZAAR) 100-25 MG tablet, TAKE 1 TABLET BY MOUTH  DAILY, Disp: 90 tablet, Rfl: 3   MAGNESIUM CITRATE PO, Take by mouth., Disp: , Rfl:    rosuvastatin (CRESTOR) 10 MG tablet, Take 1 tablet (10 mg total) by mouth daily., Disp: 30 tablet, Rfl: 5   Objective:     There were no vitals filed for this visit.    There is no height or weight on file to calculate BMI.    Physical Exam:    ***   Electronically signed by:  [name] [name] 7:42 AM 07/29/21

## 2021-07-30 ENCOUNTER — Ambulatory Visit (INDEPENDENT_AMBULATORY_CARE_PROVIDER_SITE_OTHER): Payer: Medicare Other | Admitting: Sports Medicine

## 2021-07-30 ENCOUNTER — Ambulatory Visit (INDEPENDENT_AMBULATORY_CARE_PROVIDER_SITE_OTHER): Payer: Medicare Other

## 2021-07-30 VITALS — BP 128/76 | HR 56 | Ht 62.0 in | Wt 165.0 lb

## 2021-07-30 DIAGNOSIS — M25511 Pain in right shoulder: Secondary | ICD-10-CM | POA: Diagnosis not present

## 2021-07-30 DIAGNOSIS — M7551 Bursitis of right shoulder: Secondary | ICD-10-CM | POA: Diagnosis not present

## 2021-07-30 NOTE — Patient Instructions (Addendum)
Good to see you  Shoulder HEP  4 week follow up  

## 2021-09-05 ENCOUNTER — Ambulatory Visit: Payer: Medicare Other | Admitting: Family Medicine

## 2021-09-05 ENCOUNTER — Telehealth: Payer: Medicare Other | Admitting: Physician Assistant

## 2021-09-05 DIAGNOSIS — R350 Frequency of micturition: Secondary | ICD-10-CM

## 2021-09-05 DIAGNOSIS — R051 Acute cough: Secondary | ICD-10-CM

## 2021-09-05 DIAGNOSIS — R509 Fever, unspecified: Secondary | ICD-10-CM | POA: Diagnosis not present

## 2021-09-05 DIAGNOSIS — R0609 Other forms of dyspnea: Secondary | ICD-10-CM

## 2021-09-05 DIAGNOSIS — R6883 Chills (without fever): Secondary | ICD-10-CM

## 2021-09-05 NOTE — Patient Instructions (Signed)
  Joanette Gula Hutto, thank you for joining Margaretann Loveless, PA-C for today's virtual visit.  While this provider is not your primary care provider (PCP), if your PCP is located in our provider database this encounter information will be shared with them immediately following your visit.  Consent: (Patient) Dana Dillon provided verbal consent for this virtual visit at the beginning of the encounter.  Current Medications:  Current Outpatient Medications:    busPIRone (BUSPAR) 5 MG tablet, Take 1 tablet (5 mg total) by mouth 2 (two) times daily as needed., Disp: 180 tablet, Rfl: 3   Cholecalciferol (VITAMIN D3 PO), Take 5,000 Units by mouth daily., Disp: , Rfl:    cyclobenzaprine (FLEXERIL) 5 MG tablet, Take 1 tablet (5 mg total) by mouth at bedtime as needed for muscle spasms., Disp: 30 tablet, Rfl: 1   levothyroxine (SYNTHROID) 50 MCG tablet, TAKE 1 TABLET BY MOUTH IN  THE MORNING, Disp: 90 tablet, Rfl: 3   losartan-hydrochlorothiazide (HYZAAR) 100-25 MG tablet, TAKE 1 TABLET BY MOUTH  DAILY, Disp: 90 tablet, Rfl: 3   MAGNESIUM CITRATE PO, Take by mouth., Disp: , Rfl:    rosuvastatin (CRESTOR) 10 MG tablet, Take 1 tablet (10 mg total) by mouth daily., Disp: 30 tablet, Rfl: 5   Medications ordered in this encounter:  No orders of the defined types were placed in this encounter.    *If you need refills on other medications prior to your next appointment, please contact your pharmacy*  Follow-Up: Call back or seek an in-person evaluation if the symptoms worsen or if the condition fails to improve as anticipated.    If you have been instructed to have an in-person evaluation today at a local Urgent Care facility, please use the link below. It will take you to a list of all of our available [facility], including address, phone number and hours of operation. Please do not delay care.  [facility]  If you or a family member do not have a primary care provider, use  the link below to schedule a visit and establish care. When you choose a [facility] primary care physician or advanced practice provider, you gain a long-term partner in health. Find a Primary Care Provider  Learn more about [facility] in-office and virtual care options: [facility] Get [facility] Now

## 2021-09-05 NOTE — Progress Notes (Signed)
Virtual Visit Consent   Dana Dillon, you are scheduled for a virtual visit with a [facility] provider today. Just as with appointments in the office, your consent must be obtained to participate. Your consent will be active for this visit and any virtual visit you may have with one of our providers in the next 365 days. If you have a MyChart account, a copy of this consent can be sent to you electronically.  As this is a virtual visit, video technology does not allow for your provider to perform a traditional examination. This may limit your provider's ability to fully assess your condition. If your provider identifies any concerns that need to be evaluated in person or the need to arrange testing (such as labs, EKG, etc.), we will make arrangements to do so. Although advances in technology are sophisticated, we cannot ensure that it will always work on either your end or our end. If the connection with a video visit is poor, the visit may have to be switched to a telephone visit. With either a video or telephone visit, we are not always able to ensure that we have a secure connection.  By engaging in this virtual visit, you consent to the provision of healthcare and authorize for your insurance to be billed (if applicable) for the services provided during this visit. Depending on your insurance coverage, you may receive a charge related to this service.  I need to obtain your verbal consent now. Are you willing to proceed with your visit today? Dana Dillon has provided verbal consent on 09/05/2021 for a virtual visit (video or telephone). Margaretann Loveless, PA-C  Date: 09/05/2021 3:13 PM  Virtual Visit via Video Note   I, Margaretann Loveless, connected with  Dana Dillon  ([id number], [birth year]) on 09/05/21 at  3:00 PM EDT by a video-enabled telemedicine application and verified that I am speaking with the correct person using two identifiers.  Location: Patient: Virtual Visit Location  Patient: Home Provider: Virtual Visit Location Provider: Home Office   I discussed the limitations of evaluation and management by telemedicine and the availability of in person appointments. The patient expressed understanding and agreed to proceed.    History of Present Illness: Dana Dillon is a 66 y.o. who identifies as a female who was assigned female at birth, and is being seen today for medication side effect. Started Rosuvastatin in July 2023. Started once weekly, then increased to twice weekly, then three times weekly, and now up to every other day. Since starting every other day (been on dose about a week) she has been having increased urine frequency, headaches, mild fever, chills (fever and chills started yesterday), dizziness, dry cough, having some mild shortness of breath, particularly with exertion. She denies any other urinary symptoms except frequency. States at home covid testing was negative.   Problems:  Patient Active Problem List   Diagnosis Date Noted   Osteopenia of right femoral neck 07/21/2021   Aortic atherosclerosis (HCC) 07/08/2021   Nuclear sclerotic cataract of both eyes 06/05/2021   Posterior vitreous detachment of right eye 06/05/2021   Loud snoring 05/12/2021   Nasal deviation 05/12/2021   Cystoid macular edema of right eye 03/04/2021   Exudative retinopathy, right eye 03/04/2021   Right epiretinal membrane 03/04/2021   Hyperlipidemia 07/10/2020   Anxiety 05/12/2018   Hypothyroidism 05/12/2018   Plantar fasciitis of left foot 05/14/2017   Caregiver burden 12/11/2016   Former smoker 12/28/2015   Hot flash,  menopausal 06/29/2014   Allergic rhinitis 12/03/2009   Hypertension 11/13/2005    Allergies: No Known Allergies Medications:  Current Outpatient Medications:    busPIRone (BUSPAR) 5 MG tablet, Take 1 tablet (5 mg total) by mouth 2 (two) times daily as needed., Disp: 180 tablet, Rfl: 3   Cholecalciferol (VITAMIN D3 PO), Take 5,000 Units by mouth  daily., Disp: , Rfl:    cyclobenzaprine (FLEXERIL) 5 MG tablet, Take 1 tablet (5 mg total) by mouth at bedtime as needed for muscle spasms., Disp: 30 tablet, Rfl: 1   levothyroxine (SYNTHROID) 50 MCG tablet, TAKE 1 TABLET BY MOUTH IN  THE MORNING, Disp: 90 tablet, Rfl: 3   losartan-hydrochlorothiazide (HYZAAR) 100-25 MG tablet, TAKE 1 TABLET BY MOUTH  DAILY, Disp: 90 tablet, Rfl: 3   MAGNESIUM CITRATE PO, Take by mouth., Disp: , Rfl:    rosuvastatin (CRESTOR) 10 MG tablet, Take 1 tablet (10 mg total) by mouth daily., Disp: 30 tablet, Rfl: 5  Observations/Objective: Patient is well-developed, well-nourished in no acute distress.  Resting comfortably at home.  Head is normocephalic, atraumatic.  No labored breathing.  Speech is clear and coherent with logical content.  Patient is alert and oriented at baseline.    Assessment and Plan: 1. Fever, unspecified fever cause  2. Urine frequency  3. Chills  4. DOE (dyspnea on exertion)  5. Acute cough  - Advised patient these are not normal side effects of Cholesterol medications and sounds like more possible to be a URI - She politely disagrees and feels more related to increased Rosuvastatin - Advised to stop Rosuvastatin for one week and to notify PCP of issues - If symptoms improve she may decide to try to restart Rosuvastatin as she did before, but do not increase over taking three times weekly - If symptoms do not improve, or worsen she should reach back out or be seen in person for a re-evaluation  Follow Up Instructions: I discussed the assessment and treatment plan with the patient. The patient was provided an opportunity to ask questions and all were answered. The patient agreed with the plan and demonstrated an understanding of the instructions.  A copy of instructions were sent to the patient via MyChart unless otherwise noted below.    The patient was advised to call back or seek an in-person evaluation if the symptoms worsen  or if the condition fails to improve as anticipated.  Time:  I spent 12 minutes with the patient via telehealth technology discussing the above problems/concerns.    Margaretann Loveless, PA-C

## 2021-09-07 ENCOUNTER — Encounter: Payer: Self-pay | Admitting: Family Medicine

## 2021-09-10 ENCOUNTER — Ambulatory Visit: Payer: Medicare Other | Admitting: Sports Medicine

## 2021-10-02 ENCOUNTER — Telehealth (INDEPENDENT_AMBULATORY_CARE_PROVIDER_SITE_OTHER): Payer: Medicare Other | Admitting: Primary Care

## 2021-10-02 DIAGNOSIS — G4733 Obstructive sleep apnea (adult) (pediatric): Secondary | ICD-10-CM

## 2021-10-02 NOTE — Patient Instructions (Signed)
Referral Orthodontics- Dr. Augustina Mood re: OSA, oral appliance   Follow-up 6 months with [facility] NP or sooner if needed   Sleep Apnea Sleep apnea affects breathing during sleep. It causes breathing to stop for 10 seconds or more, or to become shallow. People with sleep apnea usually snore loudly. It can also increase the risk of: Heart attack. Stroke. Being very overweight (obese). Diabetes. Heart failure. Irregular heartbeat. High blood pressure. The goal of treatment is to help you breathe normally again. What are the causes?  The most common cause of this condition is a collapsed or blocked airway. There are three kinds of sleep apnea: Obstructive sleep apnea. This is caused by a blocked or collapsed airway. Central sleep apnea. This happens when the brain does not send the right signals to the muscles that control breathing. Mixed sleep apnea. This is a combination of obstructive and central sleep apnea. What increases the risk? Being overweight. Smoking. Having a small airway. Being older. Being female. Drinking alcohol. Taking medicines to calm yourself (sedatives or tranquilizers). Having family members with the condition. Having a tongue or tonsils that are larger than normal. What are the signs or symptoms? Trouble staying asleep. Loud snoring. Headaches in the morning. Waking up gasping. Dry mouth or sore throat in the morning. Being sleepy or tired during the day. If you are sleepy or tired during the day, you may also: Not be able to focus your mind (concentrate). Forget things. Get angry a lot and have mood swings. Feel sad (depressed). Have changes in your personality. Have less interest in sex, if you are female. Be unable to have an erection, if you are female. How is this treated?  Sleeping on your side. Using a medicine to get rid of mucus in your nose (decongestant). Avoiding the use of alcohol, medicines to help you relax, or certain pain medicines  (narcotics). Losing weight, if needed. Changing your diet. Quitting smoking. Using a machine to open your airway while you sleep, such as: An oral appliance. This is a mouthpiece that shifts your lower jaw forward. A CPAP device. This device blows air through a mask when you breathe out (exhale). An EPAP device. This has valves that you put in each nostril. A BIPAP device. This device blows air through a mask when you breathe in (inhale) and breathe out. Having surgery if other treatments do not work. Follow these instructions at home: Lifestyle Make changes that your doctor recommends. Eat a healthy diet. Lose weight if needed. Avoid alcohol, medicines to help you relax, and some pain medicines. Do not smoke or use any products that contain nicotine or tobacco. If you need help quitting, ask your doctor. General instructions Take over-the-counter and prescription medicines only as told by your doctor. If you were given a machine to use while you sleep, use it only as told by your doctor. If you are having surgery, make sure to tell your doctor you have sleep apnea. You may need to bring your device with you. Keep all follow-up visits. Contact a doctor if: The machine that you were given to use during sleep bothers you or does not seem to be working. You do not get better. You get worse. Get help right away if: Your chest hurts. You have trouble breathing in enough air. You have an uncomfortable feeling in your back, arms, or stomach. You have trouble talking. One side of your body feels weak. A part of your face is hanging down. These symptoms may  be an emergency. Get help right away. Call your local emergency services (911 in the [place] Do not wait to see if the symptoms will go away. Do not drive yourself to the hospital. Summary This condition affects breathing during sleep. The most common cause is a collapsed or blocked airway. The goal of treatment is to help you breathe  normally while you sleep. This information is not intended to replace advice given to you by your health care provider. Make sure you discuss any questions you have with your health care provider. Document Revised: 08/07/2020 Document Reviewed: 12/08/2019 Elsevier Patient Education  2023 [employer].

## 2021-10-02 NOTE — Progress Notes (Signed)
Virtual Visit via Video Note  I connected with Dana Dillon on 10/02/21 at 11:30 AM EDT by a video enabled telemedicine application and verified that I am speaking with the correct person using two identifiers.  Location: Patient: Home Provider: Office    I discussed the limitations of evaluation and management by telemedicine and the availability of in person appointments. The patient expressed understanding and agreed to proceed.  History of Present Illness: 66 year old female, former smoker. PMH hypothyroidism, HTN, allergic rhinitis, hyperlipidemia, former smoker.   Previous [facility] pulmonary encounter: 05/12/2021 Patient presents today for sleep consult. She has symptoms of loud snoring, occasional daytime sleepiness and restless sleep. Symptoms have been on and off her whole life, particularly worse after menopause. She has difficulty breathing out her nose. She has noticed left nasal passage is blocked. She uses flonse and afrin as needed along with breath right strip. She has a fluid leak behind right eye causing blurry vision , her ophthalmologist told her this can be caused by sleep apnea. She has not been evaluated by ENT. She is open to surgical options if needed.   Sleep questionnaire Symptoms-  Loud snoring, restless sleep and daytime sleepiness Prior sleep study- None Bedtime-9:30 Time to fall asleep- 15 mins Nocturnal awakenings- varies, 1-4 times  Out of bed/start of day- 6-6:30 or 8-830 Weight changes-  5lb up  Do you operate heavy machinery- No Do you currently wear CPAP- No Do you current wear oxygen- No  Epworth- 7   10/02/2021- interim hx  Patient contacted today for 49-month follow-up for OSA.  Patient had a home sleep study done on 06/10/2021 that showed moderate obstructive sleep apnea, AHI 22.6 an hour.  She elected to see ear nose and throat for upper airway exam to determine if there were any surgical options for treatment of OSA.  She has a deviated nasal septum  which can be repaired, however, ENT did not think this would fix her sleep apnea.  He has difficulty breathing through her nose during daytime along with chronic nasal congestion.  She is interested in having surgery done.  Since her last office visit she has purchased a wedge pillow and does feel she is sleeping better at night.    Observations/Objective:  Appears well; No overt shortness of breath/wheezing   Assessment and Plan:  Moderate OSA - Patient has symptoms of loud snoring, restless sleep, daytime sleepiness and blurred vision  - Home sleep study 06/10/21 showed evidence of moderate obstructive sleep apnea, AHI 22.6 an hour with SPO2 low 86% (average 94%) - We reviewed treatment options including weight loss, oral appliance, CPAP therapy. She has seen ear nose and throat and does have a deviated nasal septum which they can repair. Nasal septal repair may not reverse OSA but could potentially help airflow if CPAP is needed in the future. At this time she does not want to proceed with CPAP.  She is interested in try oral appliance and focusing on sleep position with wedge pillow. I would repeat sleep study after getting oral appliance and having nasal surgery to re-evaluate AHI   Nasal deviation - Chronic nasal congestion/difficulty breathing through left nostril d/t nasal deviation  - ENT did not think nasal obstruction was cause of her sleep apnea but can repair nasal septum. She will follow back with them as she is interested in surgery.   Follow Up Instructions:    I discussed the assessment and treatment plan with the patient. The patient was  provided an opportunity to ask questions and all were answered. The patient agreed with the plan and demonstrated an understanding of the instructions.   The patient was advised to call back or seek an in-person evaluation if the symptoms worsen or if the condition fails to improve as anticipated.  I provided 28 minutes of non-face-to-face  time during this encounter.   Martyn Ehrich, NP

## 2021-10-03 NOTE — Progress Notes (Signed)
Reviewed and agree with assessment/plan.   Chesley Mires, MD [facility] Pulmonary/Critical Care 10/03/2021, 7:12 AM Pager:  [phone]

## 2021-10-06 ENCOUNTER — Encounter (INDEPENDENT_AMBULATORY_CARE_PROVIDER_SITE_OTHER): Payer: Self-pay | Admitting: Ophthalmology

## 2021-10-06 ENCOUNTER — Ambulatory Visit (INDEPENDENT_AMBULATORY_CARE_PROVIDER_SITE_OTHER): Payer: Medicare Other | Admitting: Ophthalmology

## 2021-10-06 DIAGNOSIS — H35021 Exudative retinopathy, right eye: Secondary | ICD-10-CM

## 2021-10-06 DIAGNOSIS — H35351 Cystoid macular degeneration, right eye: Secondary | ICD-10-CM

## 2021-10-06 DIAGNOSIS — H35371 Puckering of macula, right eye: Secondary | ICD-10-CM | POA: Diagnosis not present

## 2021-10-06 DIAGNOSIS — H2513 Age-related nuclear cataract, bilateral: Secondary | ICD-10-CM | POA: Diagnosis not present

## 2021-10-06 NOTE — Progress Notes (Signed)
10/06/2021     CHIEF COMPLAINT Patient presents for  Chief Complaint  Patient presents with   Retina Evaluation      HISTORY OF PRESENT ILLNESS: Dana Dillon is a 66 y.o. female who presents to the clinic today for:   HPI   Exudative retinopathy, right eye 4 mths color fp pct dilate od Pt states her vision has been stable Pt denies any new floaters or FOL Pt states she has had her sleep apnea test Last edited by Aleene Davidson, CMA on 10/06/2021  1:48 PM.      Referring physician: Shelva Majestic, MD [address] [place],  [place] [address]  HISTORICAL INFORMATION:   Selected notes from the MEDICAL RECORD NUMBER       CURRENT MEDICATIONS: No current outpatient medications on file. (Ophthalmic Drugs)   No current facility-administered medications for this visit. (Ophthalmic Drugs)   Current Outpatient Medications (Other)  Medication Sig   busPIRone (BUSPAR) 5 MG tablet Take 1 tablet (5 mg total) by mouth 2 (two) times daily as needed. (Patient not taking: Reported on 10/02/2021)   Cholecalciferol (VITAMIN D3 PO) Take 5,000 Units by mouth daily.   cyclobenzaprine (FLEXERIL) 5 MG tablet Take 1 tablet (5 mg total) by mouth at bedtime as needed for muscle spasms. (Patient not taking: Reported on 10/02/2021)   levothyroxine (SYNTHROID) 50 MCG tablet TAKE 1 TABLET BY MOUTH IN  THE MORNING   losartan-hydrochlorothiazide (HYZAAR) 100-25 MG tablet TAKE 1 TABLET BY MOUTH  DAILY   MAGNESIUM CITRATE PO Take by mouth.   rosuvastatin (CRESTOR) 10 MG tablet Take 1 tablet (10 mg total) by mouth daily. (Patient taking differently: Take 10 mg by mouth daily. Take 1 tablet three times a week)   No current facility-administered medications for this visit. (Other)      REVIEW OF SYSTEMS: ROS   Negative for: Constitutional, Gastrointestinal, Neurological, Skin, Genitourinary, Musculoskeletal, HENT, Endocrine, Cardiovascular, Eyes, Respiratory, Psychiatric, Allergic/Imm,  Heme/Lymph Last edited by Erling Cruz D, CMA on 10/06/2021  1:48 PM.       ALLERGIES No Known Allergies  PAST MEDICAL HISTORY Past Medical History:  Diagnosis Date   Hot flashes    Hypertension    LYMPHOPENIA 05/04/2007   saw oncology- later normalized. ? virus related   RHINITIS 12/03/2009   Past Surgical History:  Procedure Laterality Date   CARPAL TUNNEL RELEASE     right side. Dr. Amanda Pea   CESAREAN SECTION     x2    FAMILY HISTORY Family History  Problem Relation Age of Onset   Breast cancer Sister    Hypertension Mother    Stroke Mother        in 79s   Heart failure Father    Hypertension Father    Hyperlipidemia Father     SOCIAL HISTORY Social History   Tobacco Use   Smoking status: Former    Packs/day: 1.00    Years: 15.00    Total pack years: 15.00    Types: Cigarettes    Quit date: 05/20/1993    Years since quitting: 28.4   Smokeless tobacco: Never  Substance Use Topics   Alcohol use: Yes    Alcohol/week: 7.0 standard drinks of alcohol    Types: 7 drink(s) per week   Drug use: No         OPHTHALMIC EXAM:  Base Eye Exam     Visual Acuity (ETDRS)       Right Left  Dist Person 20/30 +3 20/20 -1         Tonometry (Tonopen, 1:51 PM)       Right Left   Pressure 14 14         Pupils       Pupils   Right PERRL   Left PERRL         Visual Fields       Left Right    Full Full         Extraocular Movement       Right Left    Ortho Ortho    -- -- --  --  --  -- -- --   -- -- --  --  --  -- -- --           Neuro/Psych     Oriented x3: Yes   Mood/Affect: Normal         Dilation     Right eye: 2.5% Phenylephrine, 1.0% Mydriacyl @ 1:48 PM           Slit Lamp and Fundus Exam     External Exam       Right Left   External Normal Normal         Slit Lamp Exam       Right Left   Lids/Lashes Normal Normal   Conjunctiva/Sclera White and quiet White and quiet   Cornea Clear Clear    Anterior Chamber Deep and quiet Deep and quiet   Iris Round and reactive Round and reactive   Lens 1+ Nuclear sclerosis 1+ Nuclear sclerosis   Anterior Vitreous Normal Normal         Fundus Exam       Right Left   Posterior Vitreous Posterior vitreous detachment    Disc Normal    C/D Ratio 0.25    Macula Epiretinal membrane mild topographic distortion    Vessels Normal exteriorly and peripherally except over the lesion described under the periphery    Periphery Region centered at 5:30 position anterior to the equator and extending to the ora serrata with the posterior edge of white subretinal fibrosis rimmed by hyperpigmented RPE changes.  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 Scan locations included subfoveal. Central Foveal Thickness: 348. Progression has been stable. Findings include abnormal foveal contour.   Left Eye Quality was good. Scan locations included subfoveal. Central Foveal Thickness: 283. Progression has been stable. Findings include normal foveal contour.      Color Fundus Photography Optos - OU - Both Eyes       Right Eye Progression has no prior data. Disc findings include normal observations. Macula : normal observations. Vessels : normal observations.   Left Eye Progression has no prior data. Disc findings include normal observations. Macula : normal observations. Vessels : normal observations. Periphery : normal observations.   Notes OD, inferonasal, 530 meridian over the posterior edge of hyperpigmentation and subretinal fibrosis in the exudative lesions seen peripherally on clinical examination is notable.                ASSESSMENT/PLAN:  Nuclear  sclerotic cataract of both eyes The nature  of cataract was discussed with the patient as well as the elective nature of surgery. The patient was reassured that surgery at a later date does not put the patient at risk for a worse outcome. It was emphasized that the need for surgery is dictated by the patient's quality of life as influenced by the cataract. Patient was instructed to maintain close follow up with their general eye care doctor.  Right epiretinal membrane Mild ERM  Cystoid macular edema of right eye Minimal OD     ICD-10-CM   1. Exudative retinopathy, right eye  H35.021 OCT, Retina - OU - Both Eyes    Color Fundus Photography Optos - OU - Both Eyes    2. Nuclear sclerotic cataract of both eyes  H25.13     3. Right epiretinal membrane  H35.371     4. Cystoid macular edema of right eye  H35.351       1.  OD, no change over time.  No extension of region lesion inferiorly.  We will continue to observe  2.  ERM OD.  3.  Patient confirmed to have moderate sleep apnea.  Working on obtaining an oral EMA type device to enhance breathing at night and prevent oral occlusion.  Ophthalmic Meds Ordered this visit:  No orders of the defined types were placed in this encounter.      Return in about 6 months (around 04/06/2022) for DILATE OU, COLOR FP, OCT.  There are no Patient Instructions on file for this visit.   Explained the diagnoses, plan, and follow up with the patient and they expressed understanding.  Patient expressed understanding of the importance of proper follow up care.   Alford Highland Jameisha Stofko M.D. Diseases & Surgery of the Retina and Vitreous [facility] 10/06/21     Abbreviations: M myopia (nearsighted); A astigmatism; H hyperopia (farsighted); P presbyopia; Mrx spectacle prescription;  CTL contact lenses; OD right eye; OS left eye; OU both eyes  XT exotropia; ET esotropia; PEK punctate epithelial keratitis; PEE punctate epithelial erosions; DES  dry eye syndrome; MGD meibomian gland dysfunction; ATs artificial tears; PFAT's preservative free artificial tears; NSC nuclear sclerotic cataract; PSC posterior subcapsular cataract; ERM epi-retinal membrane; PVD posterior vitreous detachment; RD retinal detachment; DM diabetes mellitus; DR diabetic retinopathy; NPDR non-proliferative diabetic retinopathy; PDR proliferative diabetic retinopathy; CSME clinically significant macular edema; DME diabetic macular edema; dbh dot blot hemorrhages; CWS cotton wool spot; POAG primary open angle glaucoma; C/D cup-to-disc ratio; HVF humphrey visual field; GVF goldmann visual field; OCT optical coherence tomography; IOP intraocular pressure; BRVO Branch retinal vein occlusion; CRVO central retinal vein occlusion; CRAO central retinal artery occlusion; BRAO branch retinal artery occlusion; RT retinal tear; SB scleral buckle; PPV pars plana vitrectomy; VH Vitreous hemorrhage; PRP panretinal laser photocoagulation; IVK intravitreal kenalog; VMT vitreomacular traction; MH Macular hole;  NVD neovascularization of the disc; NVE neovascularization elsewhere; AREDS age related eye disease study; ARMD age related macular degeneration; POAG primary open angle glaucoma; EBMD epithelial/anterior basement membrane dystrophy; ACIOL anterior chamber intraocular lens; IOL intraocular lens; PCIOL posterior chamber intraocular lens; Phaco/IOL phacoemulsification with intraocular lens placement; PRK photorefractive keratectomy; LASIK laser assisted in situ keratomileusis; HTN hypertension; DM diabetes mellitus; COPD chronic obstructive pulmonary disease

## 2021-10-06 NOTE — Assessment & Plan Note (Signed)
Minimal OD

## 2021-10-06 NOTE — Assessment & Plan Note (Signed)

## 2021-10-06 NOTE — Assessment & Plan Note (Signed)
Mild ERM

## 2021-11-04 ENCOUNTER — Telehealth: Payer: Self-pay | Admitting: Primary Care

## 2021-11-04 DIAGNOSIS — G4733 Obstructive sleep apnea (adult) (pediatric): Secondary | ICD-10-CM

## 2021-11-05 NOTE — Telephone Encounter (Signed)
Lm x1 for patient.  

## 2021-11-05 NOTE — Telephone Encounter (Signed)
Called and spoke to patient. She is requesting referral to Dr. Ron Parker, as Dr. Corky Sing office does not [employer].  Referral has been placed.  Nothing further needed.

## 2021-11-05 NOTE — Telephone Encounter (Signed)
Patient returning call. Please call back when available. 

## 2021-11-18 ENCOUNTER — Ambulatory Visit (INDEPENDENT_AMBULATORY_CARE_PROVIDER_SITE_OTHER): Payer: Medicare Other | Admitting: Family Medicine

## 2021-11-18 ENCOUNTER — Encounter: Payer: Self-pay | Admitting: Family Medicine

## 2021-11-18 VITALS — BP 122/82 | HR 63 | Temp 98.0°F | Ht 62.0 in | Wt 156.2 lb

## 2021-11-18 DIAGNOSIS — E039 Hypothyroidism, unspecified: Secondary | ICD-10-CM | POA: Diagnosis not present

## 2021-11-18 DIAGNOSIS — I700 Atherosclerosis of aorta: Secondary | ICD-10-CM

## 2021-11-18 DIAGNOSIS — I10 Essential (primary) hypertension: Secondary | ICD-10-CM | POA: Diagnosis not present

## 2021-11-18 DIAGNOSIS — Z23 Encounter for immunization: Secondary | ICD-10-CM

## 2021-11-18 DIAGNOSIS — F419 Anxiety disorder, unspecified: Secondary | ICD-10-CM

## 2021-11-18 DIAGNOSIS — E785 Hyperlipidemia, unspecified: Secondary | ICD-10-CM

## 2021-11-18 LAB — COMPREHENSIVE METABOLIC PANEL
ALT: 28 U/L (ref 0–35)
AST: 25 U/L (ref 0–37)
Albumin: 4.5 g/dL (ref 3.5–5.2)
Alkaline Phosphatase: 44 U/L (ref 39–117)
BUN: 16 mg/dL (ref 6–23)
CO2: 29 mEq/L (ref 19–32)
Calcium: 9.7 mg/dL (ref 8.4–10.5)
Chloride: 97 mEq/L (ref 96–112)
Creatinine, Ser: 0.77 mg/dL (ref 0.40–1.20)
GFR: 79.91 mL/min (ref >60.00)
Glucose, Bld: 92 mg/dL (ref 70–99)
Potassium: 3.6 mEq/L (ref 3.5–5.1)
Sodium: 136 mEq/L (ref 135–145)
Total Bilirubin: 0.7 mg/dL (ref 0.2–1.2)
Total Protein: 7 g/dL (ref 6.0–8.3)

## 2021-11-18 LAB — LDL CHOLESTEROL, DIRECT: Direct LDL: 115 mg/dL

## 2021-11-18 LAB — TSH: TSH: 1.77 u[IU]/mL (ref 0.35–5.50)

## 2021-11-18 NOTE — Patient Instructions (Addendum)
When you get second shingrix send [place] dates of both as well as which pharmacy  Prevnar 20/pneumonia shot next visit  You are eligible to schedule your annual wellness visit with our nurse specialist Otila Kluver.  Please consider scheduling this before you leave today  Please stop by lab before you go If you have mychart- we will send your results within 3 business days of [place] receiving them.  If you do not have mychart- we will call you about results within 5 business days of [place] receiving them.  *please also note that you will see labs on mychart as soon as they post. I will later go in and write notes on them- will say "notes from Dr. Yong Channel"   Recommended follow up: Return in about 6 months (around 05/19/2022) for physical or sooner if needed.Schedule b4 you leave.

## 2021-11-18 NOTE — Progress Notes (Signed)
Phone [phone] In person visit   Subjective:   Dana Dillon is a 66 y.o. year old very pleasant female patient who presents for/with See problem oriented charting Chief Complaint  Patient presents with   Follow-up   Hypertension   Past Medical History-  Patient Active Problem List   Diagnosis Date Noted   Hyperlipidemia 07/10/2020    Priority: High   Osteopenia of right femoral neck 07/21/2021    Priority: Medium    Aortic atherosclerosis (HCC) 07/08/2021    Priority: Medium    Anxiety 05/12/2018    Priority: Medium    Hypothyroidism 05/12/2018    Priority: Medium    Hypertension 11/13/2005    Priority: Medium    Nuclear sclerotic cataract of both eyes 06/05/2021    Priority: Low   Posterior vitreous detachment of right eye 06/05/2021    Priority: Low   Loud snoring 05/12/2021    Priority: Low   Nasal deviation 05/12/2021    Priority: Low   Cystoid macular edema of right eye 03/04/2021    Priority: Low   Exudative retinopathy, right eye 03/04/2021    Priority: Low   Right epiretinal membrane 03/04/2021    Priority: Low   Plantar fasciitis of left foot 05/14/2017    Priority: Low   Caregiver burden 12/11/2016    Priority: Low   Former smoker 12/28/2015    Priority: Low   Hot flash, menopausal 06/29/2014    Priority: Low   Allergic rhinitis 12/03/2009    Priority: Low    Medications- reviewed and updated Current Outpatient Medications  Medication Sig Dispense Refill   busPIRone (BUSPAR) 5 MG tablet Take 1 tablet (5 mg total) by mouth 2 (two) times daily as needed. 180 tablet 3   Cholecalciferol (VITAMIN D3 PO) Take 5,000 Units by mouth daily.     cyclobenzaprine (FLEXERIL) 5 MG tablet Take 1 tablet (5 mg total) by mouth at bedtime as needed for muscle spasms. 30 tablet 1   levothyroxine (SYNTHROID) 50 MCG tablet TAKE 1 TABLET BY MOUTH IN  THE MORNING 90 tablet 3   losartan-hydrochlorothiazide (HYZAAR) 100-25 MG tablet TAKE 1 TABLET BY MOUTH  DAILY 90  tablet 3   MAGNESIUM CITRATE PO Take by mouth.     rosuvastatin (CRESTOR) 10 MG tablet Take 1 tablet (10 mg total) by mouth daily. (Patient taking differently: Take 10 mg by mouth daily. Take 1 tablet three times a week) 30 tablet 5   No current facility-administered medications for this visit.     Objective:  BP 122/82   Pulse 63   Temp 98 F (36.7 C)   Ht 5\' 2"  (1.575 m)   Wt 156 lb 3.2 oz (70.9 kg)   SpO2 99%   BMI 28.57 kg/m  Gen: NAD, resting comfortably CV: RRR no murmurs rubs or gallops Lungs: CTAB no crackles, wheeze, rhonchi  Ext: no edema Skin: warm, dry    Assessment and Plan   #hypertension S: medication: losartan Hydrochlorothiazide 100-25 mg daily Home readings #s: no recent checks -able to lift weights again after working with Dr. . Down 9 lbs.  BP Readings from Last 3 Encounters:  11/18/21 122/82  07/30/21 128/76  07/21/21 120/82  A/P: Controlled. Continue current medications.  -hopefully potassium better- hair low last visit 3.4. cut down on a small licorice she was enjoying   #hyperlipidemia-in 2023 79th percentile CT cardiac scoringat 94.7- wants to hold off on cardiology consult #Aortic atherosclerosis S: Medication:rosuvastatin 3 days a week working  ok - prior rosuvastatin 10 mg 4 days a week (when increased) caused frequent urination- was tested for UTI- even had chills/fever  On no rx when this was tested Lab Results  Component Value Date   CHOL 222 (H) 05/20/2021   HDL 65.60 05/20/2021   LDLCALC 140 (H) 05/20/2021   TRIG 82.0 05/20/2021   CHOLHDL 3 05/20/2021   A/P: hopefully improved- update LDL with labs today Aortic atherosclerosis presumed stable on statin- continue risk factor modification   #hypothyroidism S: compliant On thyroid medication-levothyroxine 50 mcg daily Lab Results  Component Value Date   TSH 2.25 05/20/2021  A/P:hopefully stable- update tsh today. Continue current meds for now     #  Anxiety/GAD S:Medication: buspirone 5 mg twice daily as needed- using maybe 1-2x a month previously and about the same now or less A/P: sparing issues- continue current medicine  #OSA-reports new diagnosis 2023   - also referred to ENT for nasal deviation May 2023- using breathe right strips - but in summer harder to use -consult next week for oral appliance -no cpap   #osteopenia- followed by GYN last dexa 2022  #Prevnar 20- planned next visit- just had flu shot today  Recommended follow up: Return in about 6 months (around 05/19/2022) for physical or sooner if needed.Schedule b4 you leave. Future Appointments  Date Time Provider [place]  04/06/2022  1:30 PM Rankin, Clent Demark, MD RDE-RDE None   Lab/Order associations:   ICD-10-CM   1. Hyperlipidemia, unspecified hyperlipidemia type  E78.5     2. Hypothyroidism, unspecified type  E03.9     3. Primary hypertension  I10     4. Aortic atherosclerosis (HCC)  I70.0     5. Anxiety  F41.9     6. Need for immunization against influenza  Z23 Flu Vaccine QUAD High Dose(Fluad)     No orders of the defined types were placed in this encounter.  Return precautions advised.  Garret Reddish, MD

## 2022-01-14 ENCOUNTER — Encounter: Payer: Self-pay | Admitting: Physician Assistant

## 2022-01-14 ENCOUNTER — Ambulatory Visit (INDEPENDENT_AMBULATORY_CARE_PROVIDER_SITE_OTHER): Payer: Medicare Other | Admitting: Physician Assistant

## 2022-01-14 VITALS — BP 118/78 | HR 94 | Temp 97.7°F | Ht 62.0 in | Wt 160.4 lb

## 2022-01-14 DIAGNOSIS — J0100 Acute maxillary sinusitis, unspecified: Secondary | ICD-10-CM | POA: Diagnosis not present

## 2022-01-14 MED ORDER — AMOXICILLIN-POT CLAVULANATE 875-125 MG PO TABS: 1.0000 | ORAL_TABLET | Freq: Two times a day (BID) | ORAL | 0 refills | Status: AC

## 2022-01-14 MED ORDER — METHYLPREDNISOLONE ACETATE 80 MG/ML IJ SUSP
80.0000 mg | Freq: Once | INTRAMUSCULAR | Status: AC
  Administered 2022-01-14: 80 mg via INTRAMUSCULAR

## 2022-01-14 NOTE — Progress Notes (Signed)
Subjective:    Patient ID: Dana Dillon, female    DOB: Nov 27, 1955, 67 y.o.   MRN: [id number]  Chief Complaint  Patient presents with   Sinusitis    Pt c/o possible sinus infection; pt states has had symptoms for the past 11 days; nothing she has tried has worked; pt tested neg for covid; tried cold meds OTC; started on allergy regimen and started getting some better but still having symptoms;     Sinusitis   Patient is in today for acute sick symptoms. Negative for COVID-19 tested at home.   Symptom onset: 01/03/22 Pertinent positives: Sinus / teeth pressure, PND, dry cough Pertinent negatives: SOB, CP, N/V/D, abd pain  Treatments tried: Flonase, Claritin, Sudafed, Zyrtec, Motrin Vaccine status: Flu & COVID-19, RSV are UTD  Sick exposure: Family over holidays    Past Medical History:  Diagnosis Date   Hot flashes    Hypertension    LYMPHOPENIA 05/04/2007   saw oncology- later normalized. ? virus related   RHINITIS 12/03/2009    Past Surgical History:  Procedure Laterality Date   CARPAL TUNNEL RELEASE     right side. Dr. Amedeo Plenty   CESAREAN SECTION     x2    Family History  Problem Relation Age of Onset   Breast cancer Sister    Hypertension Mother    Stroke Mother        in 30s   Heart failure Father    Hypertension Father    Hyperlipidemia Father     Social History   Tobacco Use   Smoking status: Former    Packs/day: 1.00    Years: 15.00    Total pack years: 15.00    Types: Cigarettes    Quit date: 05/20/1993    Years since quitting: 28.6   Smokeless tobacco: Never  Substance Use Topics   Alcohol use: Yes    Alcohol/week: 7.0 standard drinks of alcohol    Types: 7 drink(s) per week   Drug use: No     No Known Allergies  Review of Systems NEGATIVE UNLESS OTHERWISE INDICATED IN HPI      Objective:     BP 118/78 (BP Location: Right Arm)   Pulse 94   Temp 97.7 F (36.5 C) (Temporal)   Ht 5\' 2"  (1.575 m)   Wt 160 lb 6.4 oz (72.8 kg)    SpO2 99%   BMI 29.34 kg/m   Wt Readings from Last 3 Encounters:  01/14/22 160 lb 6.4 oz (72.8 kg)  11/18/21 156 lb 3.2 oz (70.9 kg)  07/30/21 165 lb (74.8 kg)    BP Readings from Last 3 Encounters:  01/14/22 118/78  11/18/21 122/82  07/30/21 128/76     Physical Exam Vitals and nursing note reviewed.  Constitutional:      General: She is not in acute distress.    Appearance: Normal appearance. She is not ill-appearing.  HENT:     Head: Normocephalic.     Right Ear: Tympanic membrane, ear canal and external ear normal.     Left Ear: Tympanic membrane, ear canal and external ear normal.     Nose: Congestion present.     Mouth/Throat:     Mouth: Mucous membranes are moist.     Pharynx: No oropharyngeal exudate or posterior oropharyngeal erythema.  Eyes:     Extraocular Movements: Extraocular movements intact.     Conjunctiva/sclera: Conjunctivae normal.     Pupils: Pupils are equal, round, and reactive to light.  Cardiovascular:     Rate and Rhythm: Normal rate and regular rhythm.     Pulses: Normal pulses.     Heart sounds: Normal heart sounds. No murmur heard. Pulmonary:     Effort: Pulmonary effort is normal. No respiratory distress.     Breath sounds: Wheezing (diffuse) present.  Musculoskeletal:     Cervical back: Normal range of motion.  Skin:    General: Skin is warm.  Neurological:     Mental Status: She is alert and oriented to person, place, and time.  Psychiatric:        Mood and Affect: Mood normal.        Behavior: Behavior normal.        Assessment & Plan:  Acute non-recurrent maxillary sinusitis -     methylPREDNISolone Acetate  Other orders -     Amoxicillin-Pot Clavulanate; Take 1 tablet by mouth 2 (two) times daily for 7 days.  Dispense: 14 tablet; Refill: 0   Persistent symptoms >10 days despite conservative efforts at home. Will Rx Augmentin at this time, take with food. Cautioned on antibiotic use and possible side effects. Depomedrol 80  mg IM for added relief. Advised nasal saline, humidifier, and pushing fluids. Call if worse or no improvement.      Return if symptoms worsen or fail to improve.    Raymonde Hamblin M Madell Heino, PA-C

## 2022-02-22 ENCOUNTER — Other Ambulatory Visit: Payer: Self-pay | Admitting: Family Medicine

## 2022-02-26 DIAGNOSIS — G4733 Obstructive sleep apnea (adult) (pediatric): Secondary | ICD-10-CM | POA: Diagnosis not present

## 2022-03-07 ENCOUNTER — Other Ambulatory Visit: Payer: Self-pay | Admitting: Family Medicine

## 2022-03-31 ENCOUNTER — Telehealth: Payer: Self-pay | Admitting: Family Medicine

## 2022-03-31 NOTE — Telephone Encounter (Signed)
Copied from [place] [id number]. Topic: Medicare AWV >> Mar 31, 2022  1:13 PM Gillis Santa wrote: Reason for CRM: Called patient to schedule Medicare Annual Wellness Visit (AWV). Left message for patient to call back and schedule Medicare Annual Wellness Visit (AWV).  Last date of AWV: N/A  Please schedule an appointment at any time with Otila Kluver, [facility].  Please schedule AWVI with health coach Otila Kluver, [facility].  If any questions, please contact me at [phone].  Thank you ,  Shaune Pollack [facility] AWV TEAM Direct Dial [phone]

## 2022-04-06 ENCOUNTER — Encounter (INDEPENDENT_AMBULATORY_CARE_PROVIDER_SITE_OTHER): Payer: Medicare Other | Admitting: Ophthalmology

## 2022-04-15 ENCOUNTER — Telehealth: Payer: Self-pay | Admitting: Family Medicine

## 2022-04-15 NOTE — Telephone Encounter (Signed)
Copied from [place] [id number]. Topic: Medicare AWV >> Apr 15, 2022 11:07 AM Gillis Santa wrote: Reason for CRM: Called patient to schedule Medicare Annual Wellness Visit (AWV). Left message for patient to call back and schedule Medicare Annual Wellness Visit (AWV).  Last date of AWV: N/A  Please schedule an appointment at any time with Otila Kluver, [facility]. Please schedule AWVS with Otila Kluver, [place]..  If any questions, please contact me at [phone].  Thank you ,  Shaune Pollack [facility] AWV TEAM Direct Dial [phone]

## 2022-05-04 DIAGNOSIS — Z1231 Encounter for screening mammogram for malignant neoplasm of breast: Secondary | ICD-10-CM | POA: Diagnosis not present

## 2022-05-04 NOTE — Progress Notes (Unsigned)
    Dana Dillon D.[facility] [address] [place] [address] Phone: [phone]   Assessment and Plan:     There are no diagnoses linked to this encounter.  ***   Pertinent previous records reviewed include ***   Follow Up: ***     Subjective:   I, Dana Dillon, am serving as a Neurosurgeon for Doctor Richardean Sale   Chief Complaint: right shoulder pain    HPI:  07/30/2021 Patient is a 67 year old female complaining of right shoulder pain. Patient states that she over worked it one week, painting wall, mirrors and other things, state she arm was frozen did ice and motrin the first week of June if she moves it a certain way its a deep intermittent pain, pain radiates down the front anteriorly, no numbness tingling, notes weakness and sometimes she will get a flare holding her phone    05/05/2022 Patient states    Relevant Historical Information: Hypertension,    Additional pertinent review of systems negative.   Current Outpatient Medications:    busPIRone (BUSPAR) 5 MG tablet, Take 1 tablet (5 mg total) by mouth 2 (two) times daily as needed., Disp: 180 tablet, Rfl: 3   Cholecalciferol (VITAMIN D3 PO), Take 5,000 Units by mouth daily., Disp: , Rfl:    cyclobenzaprine (FLEXERIL) 5 MG tablet, Take 1 tablet (5 mg total) by mouth at bedtime as needed for muscle spasms., Disp: 30 tablet, Rfl: 1   levothyroxine (SYNTHROID) 50 MCG tablet, TAKE 1 TABLET BY MOUTH IN THE  MORNING, Disp: 100 tablet, Rfl: 2   losartan-hydrochlorothiazide (HYZAAR) 100-25 MG tablet, TAKE 1 TABLET BY MOUTH DAILY, Disp: 100 tablet, Rfl: 2   MAGNESIUM CITRATE PO, Take by mouth., Disp: , Rfl:    rosuvastatin (CRESTOR) 10 MG tablet, Take 1 tablet (10 mg total) by mouth daily. (Patient taking differently: Take 10 mg by mouth daily. Take 1 tablet three times a week), Disp: 30 tablet, Rfl: 5   Objective:     There were no vitals filed for this visit.    There is no  height or weight on file to calculate BMI.    Physical Exam:    ***   Electronically signed by:  Dana Dillon D.[facility] 11:55 AM 05/04/22

## 2022-05-05 ENCOUNTER — Ambulatory Visit: Payer: Medicare Other | Admitting: Sports Medicine

## 2022-05-05 VITALS — BP 110/78 | HR 61 | Ht 62.0 in | Wt 163.0 lb

## 2022-05-05 DIAGNOSIS — M25511 Pain in right shoulder: Secondary | ICD-10-CM | POA: Diagnosis not present

## 2022-05-05 DIAGNOSIS — M5030 Other cervical disc degeneration, unspecified cervical region: Secondary | ICD-10-CM

## 2022-05-05 DIAGNOSIS — M7521 Bicipital tendinitis, right shoulder: Secondary | ICD-10-CM | POA: Diagnosis not present

## 2022-05-05 DIAGNOSIS — G8929 Other chronic pain: Secondary | ICD-10-CM

## 2022-05-05 DIAGNOSIS — M542 Cervicalgia: Secondary | ICD-10-CM

## 2022-05-05 MED ORDER — MELOXICAM 15 MG PO TABS: 15.0000 mg | ORAL_TABLET | Freq: Every day | ORAL | 0 refills | Status: DC

## 2022-05-05 NOTE — Patient Instructions (Addendum)
Good to see you  - Start meloxicam 15 mg daily x2 weeks.  If still having pain after 2 weeks, complete 3rd-week of meloxicam. May use remaining meloxicam as needed once daily for pain control.  Do not to use additional NSAIDs while taking meloxicam.  May use Tylenol [phone] mg 2 to 3 times a day for breakthrough pain. Shoulder and neck HEP  Flexeril 10 mg nightly as needed  4 week follow up

## 2022-05-15 ENCOUNTER — Telehealth: Payer: Self-pay | Admitting: Family Medicine

## 2022-05-15 NOTE — Telephone Encounter (Signed)
Copied from CRM [id number]. Topic: Medicare AWV >> May 15, 2022 10:16 AM Gwenith Spitz wrote: Reason for CRM: Called patient to schedule Medicare Annual Wellness Visit (AWV). Left message for patient to call back and schedule Medicare Annual Wellness Visit (AWV).  Last date of AWV: N/A  Please schedule an appointment at any time with Inetta Fermo, [facility]. Please schedule AWVI with Inetta Fermo, [facility].  If any questions, please contact me at [phone].  Thank you ,  Gabriel Cirri [facility] AWV TEAM Direct Dial [phone]

## 2022-05-29 ENCOUNTER — Ambulatory Visit: Payer: Medicare Other | Admitting: Sports Medicine

## 2022-06-04 NOTE — Progress Notes (Signed)
Dana Dillon [facility] [address] [place] [address] Phone: [phone]   Assessment and Plan:     1. Chronic right shoulder pain 2. Neck pain -Chronic with exacerbation, subsequent visit - Overall significant improvement in biceps pain and decreased right shoulder pain, however patient continued to have shoulder pain with physical activity.  Also recurrence of left-sided neck pain radiating into left trapezius with increased activity - No red flag symptoms, so no imaging at today's visit - Discontinue meloxicam and may use remainder as needed - Recommend using Tylenol as needed for day-to-day pain relief May start prednisone 20 mg daily for 10 days - Continue HEP  Other orders - predniSONE (DELTASONE) 20 MG tablet; Take 1 tablet (20 mg total) by mouth daily with breakfast.    Pertinent previous records reviewed include none   Follow Up: 3 weeks for reevaluation.  Could consider right subacromial CSI versus physical therapy versus discussing repeat OMT   Subjective:   I, Dana Dillon, am serving as a Neurosurgeon for Doctor Dana Dillon   Chief Complaint: right shoulder pain    HPI:  07/30/2021 Patient is a 67 year old female complaining of right shoulder pain. Patient states that she over worked it one week, painting wall, mirrors and other things, state she arm was frozen did ice and motrin the first week of June if she moves it a certain way its a deep intermittent pain, pain radiates down the front anteriorly, no numbness tingling, notes weakness and sometimes she will get a flare holding her phone     05/05/2022 Patient states pain flare with neck pain and pain is radiating down to the bicep , she isnt able to lift or pick anything heavy up    06/05/2022 Patient states that she ended up taking meloxicam for 3 weeks. Bicep pain has improved. Does have achy pain in the shoulder with wiping off kitchen countertops. Pain over  middle deltoid and superior aspect of shoulder. Yesterday she developed L trap spasm that is radiating into L scapula.   Additional pertinent review of systems negative.   Current Outpatient Medications:    Cholecalciferol (VITAMIN D3 PO), Take 5,000 Units by mouth daily., Disp: , Rfl:    levothyroxine (SYNTHROID) 50 MCG tablet, TAKE 1 TABLET BY MOUTH IN THE  MORNING, Disp: 100 tablet, Rfl: 2   losartan-hydrochlorothiazide (HYZAAR) 100-25 MG tablet, TAKE 1 TABLET BY MOUTH DAILY, Disp: 100 tablet, Rfl: 2   MAGNESIUM CITRATE PO, Take by mouth., Disp: , Rfl:    meloxicam (MOBIC) 15 MG tablet, Take 1 tablet (15 mg total) by mouth daily., Disp: 30 tablet, Rfl: 0   predniSONE (DELTASONE) 20 MG tablet, Take 1 tablet (20 mg total) by mouth daily with breakfast., Disp: 10 tablet, Rfl: 0   rosuvastatin (CRESTOR) 10 MG tablet, Take 1 tablet (10 mg total) by mouth daily. (Patient taking differently: Take 10 mg by mouth daily. Take 1 tablet three times a week), Disp: 30 tablet, Rfl: 5   busPIRone (BUSPAR) 5 MG tablet, Take 1 tablet (5 mg total) by mouth 2 (two) times daily as needed., Disp: 180 tablet, Rfl: 3   cyclobenzaprine (FLEXERIL) 5 MG tablet, Take 1 tablet (5 mg total) by mouth at bedtime as needed for muscle spasms., Disp: 30 tablet, Rfl: 1   Objective:     Vitals:   06/05/22 1132  BP: 128/86  Pulse: 85  SpO2: 99%  Weight: 168 lb (76.2 kg)  Height:  5\' 2"  (1.575 m)      Body mass index is 30.73 kg/m.    Physical Exam:    Gen: Appears well, nad, nontoxic and pleasant Neuro:sensation intact, strength is 5/5 with df/pf/inv/ev, muscle tone wnl Skin: no suspicious lesion or defmority Psych: A&O, appropriate mood and affect   Right shoulder:  No deformity, swelling or muscle wasting No scapular winging FF 180, abd 180, int 0, ext 90 with painful end arc in all directions NTTP over the McIntyre, clavicle, ac, coracoid, biceps groove, humerus, deltoid, trapezius, cervical spine Positive  Hawkins, empty can, O'Brien, speeds Neg neer, crossarm, subscap liftoff,   Neg ant drawer, sulcus sign, apprehension Negative Spurling's test bilat Decreased sidebending, otherwise FROM of neck     Electronically signed by:  Dana Dillon [facility] 11:50 AM 06/05/22

## 2022-06-05 ENCOUNTER — Ambulatory Visit: Payer: Medicare Other | Admitting: Sports Medicine

## 2022-06-05 VITALS — BP 128/86 | HR 85 | Ht 62.0 in | Wt 168.0 lb

## 2022-06-05 DIAGNOSIS — M25511 Pain in right shoulder: Secondary | ICD-10-CM

## 2022-06-05 DIAGNOSIS — G8929 Other chronic pain: Secondary | ICD-10-CM

## 2022-06-05 DIAGNOSIS — M542 Cervicalgia: Secondary | ICD-10-CM

## 2022-06-05 MED ORDER — PREDNISONE 20 MG PO TABS: 20.0000 mg | ORAL_TABLET | Freq: Every day | ORAL | 0 refills | Status: DC

## 2022-06-05 NOTE — Patient Instructions (Signed)
Prednisone 20mg  for 10 days See me again in 3 weeks

## 2022-06-11 ENCOUNTER — Other Ambulatory Visit: Payer: Self-pay | Admitting: Pharmacist

## 2022-06-11 MED ORDER — ROSUVASTATIN CALCIUM 10 MG PO TABS: 10.0000 mg | ORAL_TABLET | ORAL | 1 refills | Status: DC

## 2022-06-11 NOTE — Telephone Encounter (Signed)
Pharmacy Quality Measure Review  This patient is appearing on the insurance-provided list for being at risk of failing the adherence measure for cholesterol medications this calendar year.   Medication: rouvastatin 10mg   Directions are take 1 tablet daily. Take 3 times per week.  Per Dr Erasmo Leventhal last lab report, it looks like his instructions were to take 1 tablet 3 times per week.  30 tablets actually last 10 weeks or about 70 days.   Refill history for rosuvastatin:    Tried to outreach patient to verify how she is actually taking rosuvastatin. Consider updating Rx with correct directions of 3 times per week and for 100 day supply or #70 tablet since her [facility] plan will allow up to a 100 ds.

## 2022-06-16 DIAGNOSIS — H35371 Puckering of macula, right eye: Secondary | ICD-10-CM | POA: Diagnosis not present

## 2022-06-16 DIAGNOSIS — H35071 Retinal telangiectasis, right eye: Secondary | ICD-10-CM | POA: Diagnosis not present

## 2022-06-16 DIAGNOSIS — H2513 Age-related nuclear cataract, bilateral: Secondary | ICD-10-CM | POA: Diagnosis not present

## 2022-06-16 DIAGNOSIS — H35351 Cystoid macular degeneration, right eye: Secondary | ICD-10-CM | POA: Diagnosis not present

## 2022-06-16 DIAGNOSIS — H35021 Exudative retinopathy, right eye: Secondary | ICD-10-CM | POA: Diagnosis not present

## 2022-06-22 NOTE — Progress Notes (Unsigned)
    Dana Dillon [facility] [address] [place] [address] Phone: [phone]   Assessment and Plan:     There are no diagnoses linked to this encounter.  ***   Pertinent previous records reviewed include ***   Follow Up: ***     Subjective:   I, Dana Dillon, am serving as a Neurosurgeon for Dana Dillon   Chief Complaint: right shoulder pain    HPI:  07/30/2021 Patient is a 68 year old female complaining of right shoulder pain. Patient states that she over worked it one week, painting wall, mirrors and other things, state she arm was frozen did ice and motrin the first week of June if she moves it a certain way its a deep intermittent pain, pain radiates down the front anteriorly, no numbness tingling, notes weakness and sometimes she will get a flare holding her phone     05/05/2022 Patient states pain flare with neck pain and pain is radiating down to the bicep , she isnt able to lift or pick anything heavy up    06/05/2022 Patient states that she ended up taking meloxicam for 3 weeks. Bicep pain has improved. Does have achy pain in the shoulder with wiping off kitchen countertops. Pain over middle deltoid and superior aspect of shoulder. Yesterday she developed L trap spasm that is radiating into L scapula.   06/23/2022 Patient states   Additional pertinent review of systems negative.   Current Outpatient Medications:    busPIRone (BUSPAR) 5 MG tablet, Take 1 tablet (5 mg total) by mouth 2 (two) times daily as needed., Disp: 180 tablet, Rfl: 3   Cholecalciferol (VITAMIN D3 PO), Take 5,000 Units by mouth daily., Disp: , Rfl:    cyclobenzaprine (FLEXERIL) 5 MG tablet, Take 1 tablet (5 mg total) by mouth at bedtime as needed for muscle spasms., Disp: 30 tablet, Rfl: 1   levothyroxine (SYNTHROID) 50 MCG tablet, TAKE 1 TABLET BY MOUTH IN THE  MORNING, Disp: 100 tablet, Rfl: 2   losartan-hydrochlorothiazide (HYZAAR) 100-25 MG  tablet, TAKE 1 TABLET BY MOUTH DAILY, Disp: 100 tablet, Rfl: 2   MAGNESIUM CITRATE PO, Take by mouth., Disp: , Rfl:    meloxicam (MOBIC) 15 MG tablet, Take 1 tablet (15 mg total) by mouth daily., Disp: 30 tablet, Rfl: 0   predniSONE (DELTASONE) 20 MG tablet, Take 1 tablet (20 mg total) by mouth daily with breakfast., Disp: 10 tablet, Rfl: 0   rosuvastatin (CRESTOR) 10 MG tablet, Take 1 tablet (10 mg total) by mouth 3 (three) times a week. Take 1 tablet three times a week, Disp: 70 tablet, Rfl: 1   Objective:     There were no vitals filed for this visit.    There is no height or weight on file to calculate BMI.    Physical Exam:    ***   Electronically signed by:  Dana Dillon [facility] 11:43 AM 06/22/22

## 2022-06-23 ENCOUNTER — Ambulatory Visit: Payer: Medicare Other | Admitting: Sports Medicine

## 2022-06-23 VITALS — HR 75 | Ht 62.0 in | Wt 168.0 lb

## 2022-06-23 DIAGNOSIS — M542 Cervicalgia: Secondary | ICD-10-CM | POA: Diagnosis not present

## 2022-06-23 DIAGNOSIS — M7521 Bicipital tendinitis, right shoulder: Secondary | ICD-10-CM

## 2022-06-23 DIAGNOSIS — G8929 Other chronic pain: Secondary | ICD-10-CM | POA: Diagnosis not present

## 2022-06-23 DIAGNOSIS — M25511 Pain in right shoulder: Secondary | ICD-10-CM | POA: Diagnosis not present

## 2022-07-07 NOTE — Progress Notes (Deleted)
    Dana Dillon [name] [address] [place] [address] Phone: [phone]   Assessment and Plan:     There are no diagnoses linked to this encounter.  ***   Pertinent previous records reviewed include ***   Follow Up: ***     Subjective:   I, Dana Dillon, am serving as a Neurosurgeon for Doctor Richardean Sale   Chief Complaint: right shoulder pain    HPI:  07/30/2021 Patient is a 67 year old female complaining of right shoulder pain. Patient states that she over worked it one week, painting wall, mirrors and other things, state she arm was frozen did ice and motrin the first week of June if she moves it a certain way its a deep intermittent pain, pain radiates down the front anteriorly, no numbness tingling, notes weakness and sometimes she will get a flare holding her phone     05/05/2022 Patient states pain flare with neck pain and pain is radiating down to the bicep , she isnt able to lift or pick anything heavy up    06/05/2022 Patient states that she ended up taking meloxicam for 3 weeks. Bicep pain has improved. Does have achy pain in the shoulder with wiping off kitchen countertops. Pain over middle deltoid and superior aspect of shoulder. Yesterday she developed L trap spasm that is radiating into L scapula.    06/23/2022 Patient states a lot improved , still has pain with pressing, cutting , and writing , had a snag in her bicep when she went to lift a small cooler   07/14/2022 Patient states   Additional pertinent review of systems negative.   Current Outpatient Medications:    busPIRone (BUSPAR) 5 MG tablet, Take 1 tablet (5 mg total) by mouth 2 (two) times daily as needed., Disp: 180 tablet, Rfl: 3   Cholecalciferol (VITAMIN D3 PO), Take 5,000 Units by mouth daily., Disp: , Rfl:    cyclobenzaprine (FLEXERIL) 5 MG tablet, Take 1 tablet (5 mg total) by mouth at bedtime as needed for muscle spasms., Disp: 30 tablet, Rfl:  1   levothyroxine (SYNTHROID) 50 MCG tablet, TAKE 1 TABLET BY MOUTH IN THE  MORNING, Disp: 100 tablet, Rfl: 2   losartan-hydrochlorothiazide (HYZAAR) 100-25 MG tablet, TAKE 1 TABLET BY MOUTH DAILY, Disp: 100 tablet, Rfl: 2   MAGNESIUM CITRATE PO, Take by mouth., Disp: , Rfl:    meloxicam (MOBIC) 15 MG tablet, Take 1 tablet (15 mg total) by mouth daily., Disp: 30 tablet, Rfl: 0   predniSONE (DELTASONE) 20 MG tablet, Take 1 tablet (20 mg total) by mouth daily with breakfast., Disp: 10 tablet, Rfl: 0   rosuvastatin (CRESTOR) 10 MG tablet, Take 1 tablet (10 mg total) by mouth 3 (three) times a week. Take 1 tablet three times a week, Disp: 70 tablet, Rfl: 1   Objective:     There were no vitals filed for this visit.    There is no height or weight on file to calculate BMI.    Physical Exam:    ***   Electronically signed by:  Dana Dillon [name] 7:27 AM 07/07/22

## 2022-07-14 ENCOUNTER — Ambulatory Visit: Payer: Medicare Other | Admitting: Sports Medicine

## 2022-07-23 ENCOUNTER — Encounter: Payer: Self-pay | Admitting: Family Medicine

## 2022-07-23 ENCOUNTER — Ambulatory Visit (INDEPENDENT_AMBULATORY_CARE_PROVIDER_SITE_OTHER): Payer: Medicare Other | Admitting: Family Medicine

## 2022-07-23 VITALS — BP 130/86 | HR 73 | Temp 97.7°F | Resp 18 | Ht 62.0 in | Wt 163.2 lb

## 2022-07-23 DIAGNOSIS — L608 Other nail disorders: Secondary | ICD-10-CM | POA: Diagnosis not present

## 2022-07-23 NOTE — Patient Instructions (Signed)
Referral to pod

## 2022-07-23 NOTE — Progress Notes (Signed)
Subjective:     Patient ID: Dana Dillon, female    DOB: 28-Jul-1955, 67 y.o.   MRN: [id number]  Chief Complaint  Patient presents with   Toe Pain    Pain in big toe on left foot, possible referral to podiatry    HPI Left great toe nail pain - She states many years ago having left great toe nail pain after a pedicure. At the time saw dermatology, stated it was just discolored. Over the years she reports the nail has become ingrown. She states in February she got home after walking a lot, noted dark red dried blood under her entire toe nail. Pain, swelling, and redness have worsened. At times is unable to put on shoes due to pain. Tries soaking it in Epsom salt to relieve pain, with some relief. Denies any discharge, trauma, gout, or joint pain. Nail has not come off at any point. Is interested in referral to podiatry. Also, spot on 5th toe that gets tender as well  Health Maintenance Due  Topic Date Due   Medicare Annual Wellness (AWV)  Never done    Past Medical History:  Diagnosis Date   Hot flashes    Hypertension    LYMPHOPENIA 05/04/2007   saw oncology- later normalized. ? virus related   RHINITIS 12/03/2009    Past Surgical History:  Procedure Laterality Date   CARPAL TUNNEL RELEASE     right side. Dr. Amanda Pea   CESAREAN SECTION     x2     Current Outpatient Medications:    Cholecalciferol (VITAMIN D3 PO), Take 5,000 Units by mouth daily., Disp: , Rfl:    levothyroxine (SYNTHROID) 50 MCG tablet, TAKE 1 TABLET BY MOUTH IN THE  MORNING, Disp: 100 tablet, Rfl: 2   losartan-hydrochlorothiazide (HYZAAR) 100-25 MG tablet, TAKE 1 TABLET BY MOUTH DAILY, Disp: 100 tablet, Rfl: 2   MAGNESIUM CITRATE PO, Take by mouth., Disp: , Rfl:    rosuvastatin (CRESTOR) 10 MG tablet, Take 1 tablet (10 mg total) by mouth 3 (three) times a week. Take 1 tablet three times a week, Disp: 70 tablet, Rfl: 1  No Known Allergies ROS neg/noncontributory except as noted HPI/below      Objective:      BP 130/86   Pulse 73   Temp 97.7 F (36.5 C) (Temporal)   Resp 18   Ht 5\' 2"  (1.575 m)   Wt 163 lb 4 oz (74 kg)   SpO2 98%   BMI 29.86 kg/m  Wt Readings from Last 3 Encounters:  07/23/22 163 lb 4 oz (74 kg)  06/23/22 168 lb (76.2 kg)  06/05/22 168 lb (76.2 kg)    Physical Exam  Gen: WDWN NAD HEENT: NCAT, conjunctiva not injected, sclera nonicteric EXT:  no edema MSK: no gross abnormalities.  NEURO: A&O x3.  CN II-XII intact.  PSYCH: normal mood. Good eye contact  Foot: Left great toe nail thickened. Lifts off from medial side but still attached on lateral side. No sign of infection. Corn/wart on left 5th toe lateral side.     Assessment & Plan:  Toenail deformity -     Ambulatory referral to Podiatry  Toenail deformity-pt put up with it too long.  Lifting but still attached.  Corn/wart on 5th toe as well.  Referral to pod-pt will call as well.   No follow-ups on file.   I,Rachel Rivera,acting as a scribe for Angelena Sole, MD.,have documented all relevant documentation on the behalf of Ruffin Frederick  Ruthine Dose, MD,as directed by  Angelena Sole, MD while in the presence of Angelena Sole, MD.  I, Angelena Sole, MD, have reviewed all documentation for this visit. The documentation on 07/23/22 for the exam, diagnosis, procedures, and orders are all accurate and complete.   Angelena Sole, MD

## 2022-08-06 ENCOUNTER — Ambulatory Visit: Payer: Medicare Other | Admitting: Podiatry

## 2022-08-06 DIAGNOSIS — L603 Nail dystrophy: Secondary | ICD-10-CM

## 2022-08-06 DIAGNOSIS — L608 Other nail disorders: Secondary | ICD-10-CM | POA: Diagnosis not present

## 2022-08-06 DIAGNOSIS — L84 Corns and callosities: Secondary | ICD-10-CM

## 2022-08-06 DIAGNOSIS — B351 Tinea unguium: Secondary | ICD-10-CM | POA: Diagnosis not present

## 2022-08-06 MED ORDER — CICLOPIROX 8 % EX SOLN: Freq: Every day | CUTANEOUS | 0 refills | Status: DC

## 2022-08-06 NOTE — Progress Notes (Signed)
  Subjective:  Patient ID: Dana Dillon, female    DOB: Mar 16, 1955,  MRN: [id number]  Chief Complaint  Patient presents with   Nail Problem    Left hallux nail fungus. Has never been tx for nail fungus.    Callouses    Corn to left 5th toe. Patient is not diabetic.     67 y.o. female presents with concern for callus to the left fifth toe.  Patient denies a history of diabetes or peripheral arterial disease.  Patient also concerned about discoloration thickening and abnormal growth the left great toenail.  She says that the nail did mostly fall off and there was some nail growing underneath.  She has never been treated with a prescription strength topical or oral antifungal.  Past Medical History:  Diagnosis Date   Hot flashes    Hypertension    LYMPHOPENIA 05/04/2007   saw oncology- later normalized. ? virus related   RHINITIS 12/03/2009    No Known Allergies  ROS: Negative except as per HPI above  Objective:  General: AAO x3, NAD  Dermatological: Callus present at the lateral aspect of the fifth toe nail base.  Adductovarus rotation of the toe is noted.  There is also yellow discoloration and thickening dystrophy and subungual debris present on the left hallux nail.  Vascular:  Dorsalis Pedis artery and Posterior Tibial artery pedal pulses are 2/4 bilateral.  Capillary fill time < 3 sec to all digits.   Neruologic: Grossly intact via light touch bilateral. Protective threshold intact to all sites bilateral.   Musculoskeletal: No gross boney pedal deformities bilateral. No pain, crepitus, or limitation noted with foot and ankle range of motion bilateral. Muscular strength 5/5 in all groups tested bilateral.  Gait: Unassisted, Nonantalgic.   No images are attached to the encounter.   Assessment:   1. Nail dystrophy   2. Onychomycosis   3. Callus of foot      Plan:  Patient was evaluated and treated and all questions answered.  # Callus of the left fifth toe All  symptomatic hyperkeratoses x1 were safely debrided with a sterile #15 blade to patient's level of comfort without incident. We discussed preventative and palliative care of these lesions including supportive and accommodative shoegear, padding, prefabricated and custom molded accommodative orthoses, use of a pumice stone and lotions/creams daily.  # Onychomycosis Onychomycosis -Educated on etiology of nail fungus. -Nail sample taken for microbiology and histology. -Discussed oral and topical antifungal treatment -Patient wishes for conservative care with topical antifungal at this time.  eRx for Penlac 8% topical solution applied daily to the left hallux nail for the next 90 days.     Return in about 3 months (around 11/06/2022) for nail fungus f/u.          Corinna Gab, DPM [facility]

## 2022-08-13 ENCOUNTER — Telehealth: Payer: Self-pay

## 2022-09-07 ENCOUNTER — Ambulatory Visit: Payer: Medicare Other | Admitting: Family Medicine

## 2022-09-07 NOTE — Progress Notes (Unsigned)
    Dana Dillon [name] [address] [place] [address] Phone: [phone]   Assessment and Plan:     There are no diagnoses linked to this encounter.  ***   Pertinent previous records reviewed include ***   Follow Up: ***     Subjective:   I, Dana Dillon, am serving as a Neurosurgeon for Doctor Richardean Sale  Chief Complaint: hamstring pain   HPI:   09/08/2022 Patient is a 67 year old female complaining of hamstring pain. Patient states  Relevant Historical Information: ***  Additional pertinent review of systems negative.   Current Outpatient Medications:    Cholecalciferol (VITAMIN D3 PO), Take 5,000 Units by mouth daily., Disp: , Rfl:    ciclopirox (PENLAC) 8 % solution, Apply topically at bedtime. Apply over nail and surrounding skin. Apply daily over previous coat. After seven (7) days, may remove with alcohol and continue cycle., Disp: 6.6 mL, Rfl: 0   levothyroxine (SYNTHROID) 50 MCG tablet, TAKE 1 TABLET BY MOUTH IN THE  MORNING, Disp: 100 tablet, Rfl: 2   losartan-hydrochlorothiazide (HYZAAR) 100-25 MG tablet, TAKE 1 TABLET BY MOUTH DAILY, Disp: 100 tablet, Rfl: 2   MAGNESIUM CITRATE PO, Take by mouth., Disp: , Rfl:    rosuvastatin (CRESTOR) 10 MG tablet, Take 1 tablet (10 mg total) by mouth 3 (three) times a week. Take 1 tablet three times a week, Disp: 70 tablet, Rfl: 1   Objective:     There were no vitals filed for this visit.    There is no height or weight on file to calculate BMI.    Physical Exam:    ***   Electronically signed by:  Dana Dillon [name] 12:12 PM 09/07/22

## 2022-09-08 ENCOUNTER — Other Ambulatory Visit: Payer: Self-pay

## 2022-09-08 ENCOUNTER — Ambulatory Visit (INDEPENDENT_AMBULATORY_CARE_PROVIDER_SITE_OTHER): Payer: Medicare Other

## 2022-09-08 ENCOUNTER — Ambulatory Visit: Payer: Medicare Other | Admitting: Sports Medicine

## 2022-09-08 ENCOUNTER — Ambulatory Visit: Payer: Medicare Other | Admitting: Family

## 2022-09-08 VITALS — BP 122/84 | HR 87 | Ht 62.0 in | Wt 160.0 lb

## 2022-09-08 DIAGNOSIS — M160 Bilateral primary osteoarthritis of hip: Secondary | ICD-10-CM | POA: Diagnosis not present

## 2022-09-08 DIAGNOSIS — M25551 Pain in right hip: Secondary | ICD-10-CM | POA: Diagnosis not present

## 2022-09-08 DIAGNOSIS — S76311A Strain of muscle, fascia and tendon of the posterior muscle group at thigh level, right thigh, initial encounter: Secondary | ICD-10-CM | POA: Diagnosis not present

## 2022-09-08 MED ORDER — CYCLOBENZAPRINE HCL 5 MG PO TABS: 5.0000 mg | ORAL_TABLET | Freq: Every day | ORAL | 0 refills | Status: DC

## 2022-09-08 NOTE — Patient Instructions (Signed)
Weight baring as tolerated with goal of pain free  Recommend a walker or wheelchair if needed  - Start meloxicam 15 mg daily x2 weeks.  If still having pain after 2 weeks, complete 3rd-week of meloxicam. May use remaining meloxicam as needed once daily for pain control.  Do not to use additional NSAIDs while taking meloxicam.  May use Tylenol [phone] mg 2 to 3 times a day for breakthrough pain. Call us if you need a refill  Flexeril 5-10 mg nightly as needed for muscle spasms 2 week follow up

## 2022-09-15 ENCOUNTER — Other Ambulatory Visit: Payer: Self-pay | Admitting: Sports Medicine

## 2022-09-15 ENCOUNTER — Telehealth: Payer: Self-pay | Admitting: Sports Medicine

## 2022-09-15 ENCOUNTER — Other Ambulatory Visit: Payer: Self-pay | Admitting: Podiatry

## 2022-09-15 MED ORDER — MELOXICAM 15 MG PO TABS: 15.0000 mg | ORAL_TABLET | Freq: Every day | ORAL | 0 refills | Status: DC

## 2022-09-15 NOTE — Telephone Encounter (Signed)
Pt calling for refill of Meloxicam. Used all she had from previous issue and is now needed for new issue.  [facility]

## 2022-09-15 NOTE — Progress Notes (Signed)
Refill placed

## 2022-09-16 ENCOUNTER — Telehealth: Payer: Self-pay | Admitting: Family Medicine

## 2022-09-16 DIAGNOSIS — E039 Hypothyroidism, unspecified: Secondary | ICD-10-CM

## 2022-09-16 NOTE — Telephone Encounter (Signed)
FYI

## 2022-09-16 NOTE — Telephone Encounter (Signed)
Caller was Dana Dillon from [employer]. States they are switching manufacturers for the levothyroxine. States going from [facility] to [employer] and wanted to know if PCP approved of this medication to be filled with this manufacturer. States PCP can respond via the fax they are sending today or via phone. Can call [phone]  and use the reference number [id number].

## 2022-09-16 NOTE — Telephone Encounter (Signed)
Pt notified of refill via VM. 

## 2022-09-16 NOTE — Telephone Encounter (Signed)
Yes I approve the change but please set her up for repeat TSH under hypothyroidism in 6 weeks after the change

## 2022-09-17 NOTE — Telephone Encounter (Signed)
LVM to schedule lab visit 6 wks after starting medication

## 2022-09-17 NOTE — Telephone Encounter (Signed)
Called and spoke with the pharmacist Weston Brass and approved the change, future TSH ordered.

## 2022-09-21 NOTE — Progress Notes (Unsigned)
    Dana Dillon [name] [address] [place] [address] Phone: [phone]   Assessment and Plan:     There are no diagnoses linked to this encounter.  ***   Pertinent previous records reviewed include ***   Follow Up: ***     Subjective:   I, Dana Dillon, am serving as a Neurosurgeon for Doctor Richardean Sale   Chief Complaint: hamstring pain    HPI:    09/08/2022 Patient is a 66 year old female complaining of hamstring pain. Patient states was packing and was kneeling on the floor and her legs started sliding in a splits motion and she heard a pop on the right hamstring. Patient took a flexeril that night. Patient was in [place] and had to use the wheels chair and she started to feel better, but when she was in the airport she turned to grab her suitcase and turned wrong and the pain is back. Using tylenol, flexeril, heating pad, rest. Just feels super tight and pulled   09/22/2022 Patient states  Relevant Historical Information: Hypertension, hypothyroidism  Additional pertinent review of systems negative.   Current Outpatient Medications:    Cholecalciferol (VITAMIN D3 PO), Take 5,000 Units by mouth daily., Disp: , Rfl:    ciclopirox (PENLAC) 8 % solution, APPLY DAILY OVER NAIL AND SURROUNDING SKIN AND OVER PREVIOUS COAT. AFTER SEVEN (7) DAYS, MAY REMOVE WITH ALCOHOL AND CONTINUE CYCLE., Disp: 6.6 mL, Rfl: 0   cyclobenzaprine (FLEXERIL) 5 MG tablet, Take 1 tablet (5 mg total) by mouth at bedtime., Disp: 30 tablet, Rfl: 0   levothyroxine (SYNTHROID) 50 MCG tablet, TAKE 1 TABLET BY MOUTH IN THE  MORNING, Disp: 100 tablet, Rfl: 2   losartan-hydrochlorothiazide (HYZAAR) 100-25 MG tablet, TAKE 1 TABLET BY MOUTH DAILY, Disp: 100 tablet, Rfl: 2   MAGNESIUM CITRATE PO, Take by mouth., Disp: , Rfl:    meloxicam (MOBIC) 15 MG tablet, Take 1 tablet (15 mg total) by mouth daily., Disp: 30 tablet, Rfl: 0   rosuvastatin (CRESTOR) 10 MG tablet,  Take 1 tablet (10 mg total) by mouth 3 (three) times a week. Take 1 tablet three times a week, Disp: 70 tablet, Rfl: 1   Objective:     There were no vitals filed for this visit.    There is no height or weight on file to calculate BMI.    Physical Exam:    ***   Electronically signed by:  Dana Dillon [name] 4:30 PM 09/21/22

## 2022-09-22 ENCOUNTER — Ambulatory Visit: Payer: Medicare Other | Admitting: Sports Medicine

## 2022-09-22 VITALS — BP 122/82 | HR 74 | Ht 62.0 in | Wt 160.0 lb

## 2022-09-22 DIAGNOSIS — S76311A Strain of muscle, fascia and tendon of the posterior muscle group at thigh level, right thigh, initial encounter: Secondary | ICD-10-CM

## 2022-09-22 DIAGNOSIS — M25551 Pain in right hip: Secondary | ICD-10-CM

## 2022-09-22 NOTE — Patient Instructions (Signed)
Hamstring HEP  Pt referral  1 more week of meloxicam  Use flexeril only as needed for muscle spasm  4-6 week follow up

## 2022-09-24 ENCOUNTER — Other Ambulatory Visit: Payer: Self-pay | Admitting: Family Medicine

## 2022-09-25 ENCOUNTER — Ambulatory Visit: Payer: Medicare Other | Admitting: Physical Therapy

## 2022-09-30 ENCOUNTER — Other Ambulatory Visit: Payer: Self-pay

## 2022-09-30 ENCOUNTER — Ambulatory Visit: Payer: Medicare Other | Admitting: Physical Therapy

## 2022-09-30 ENCOUNTER — Encounter: Payer: Self-pay | Admitting: Physical Therapy

## 2022-09-30 DIAGNOSIS — M79604 Pain in right leg: Secondary | ICD-10-CM

## 2022-09-30 DIAGNOSIS — M25551 Pain in right hip: Secondary | ICD-10-CM

## 2022-09-30 NOTE — Therapy (Unsigned)
OUTPATIENT PHYSICAL THERAPY LOWER EXTREMITY EVALUATION   Patient Name: Dana Dillon MRN: [id number] DOB:1955/02/21, 67 y.o., female Today's Date: 09/30/2022  END OF SESSION:  PT End of Session - 09/30/22 1137     Visit Number 1    Number of Visits 16    Date for PT Re-Evaluation 11/25/22    Authorization Type [employer] Medicare    PT Start Time 0933    PT Stop Time 1014    PT Time Calculation (min) 41 min    Activity Tolerance Patient tolerated treatment well    Behavior During Therapy [facility] for tasks assessed/performed             Past Medical History:  Diagnosis Date   Hot flashes    Hypertension    LYMPHOPENIA 05/04/2007   saw oncology- later normalized. ? virus related   RHINITIS 12/03/2009   Past Surgical History:  Procedure Laterality Date   CARPAL TUNNEL RELEASE     right side. Dr. 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   SUBJECTIVE:   SUBJECTIVE STATEMENT: August 22, Pt slipped at home, she was kneeling, and  knees separated, heard a pop, had initial pain. Pain was not awful, was able to go away that weekend. But in airport a few days later she turned and had significant pain. She thinks tear happened at [employer].  She was found to have partial Tear of R Hamstring/proximal.  Previous: sometimes has pain in hips when laying on her side.  Soreness at R HS insertion.  Feels she is 97 % better at this time, but still hurts when turning certain ways.  Exercise: walk/jog on t-mill for 30 min, and weight machines at home.  R shoulder- pain, would like to review weight mechaics.  Walking: no pain at this time, but has not been going very far.    PERTINENT HISTORY: HTN  PAIN:  Are you having pain? Yes: NPRS scale: 0-2 /10 Pain location: R Hamstring  Pain description: Tight, pull  Aggravating factors: Bending over, getting in/out of car,  Relieving factors: none stated    PRECAUTIONS: None  WEIGHT BEARING RESTRICTIONS: No  FALLS:  Has patient fallen in last 6 months? No  PLOF: Independent  PATIENT GOALS:  Decreased pain   NEXT MD VISIT:   OBJECTIVE:   DIAGNOSTIC FINDINGS:  US Impression:  Proximal hamstring tendon partial tearing  PATIENT SURVEYS: FOTO: initial:  71.9  COGNITION: Overall cognitive status: Within functional limits for tasks assessed  SENSATION: WFL  EDEMA:   POSTURE:    No Significant postural limitations  PALPATION:   mild tenderness at R HS insertion, mild tenderness in mid and distal HS as well.  Mild tenderness in R groin line, none at pubic symphysis.    LOWER EXTREMITY ROM: Hips: R hip flexion, mild limitation due to pull on hamstring,  Knees: WFL    LOWER EXTREMITY MMT:  MMT Left eval Right  eval  Hip flexion 4 4-  Hip extension    Hip abduction 4 4-  Hip adduction    Hip internal rotation    Hip external rotation    Knee flexion 5 4-  Knee extension 5 4  Ankle dorsiflexion    Ankle plantarflexion    Ankle inversion    Ankle  eversion     (Blank rows = not tested)   FUNCTIONAL TESTS:    GAIT: Distance walked: 164ft Assistive device utilized: None Level of assistance: Complete Independence Comments: early heel rise in terminal stance    TODAY'S TREATMENT:                                                                                                                              DATE:   09/30/2022 Therapeutic Exercise: Aerobic: Supine:   hip ER fallouts for adductor stretch x 10, slow, gentle.  Prone:  hamstring curl/ROM x 10;  Seated:  LAQ (for light hamstring stretch) 2 x 5,, partial ROM;  Standing: Stretches:  Neuromuscular Re-education: Manual Therapy: Therapeutic Activity: Self Care:    PATIENT EDUCATION:  Education details: PT POC, Exam findings, HEP Person educated: Patient Education method: Explanation, Demonstration, Tactile cues, Verbal cues, and Handouts Education comprehension: verbalized understanding, returned demonstration, verbal cues required, tactile cues required, and needs further education   HOME EXERCISE PROGRAM: Access Code: [email or web address] URL: [email or web address] Date: 09/30/2022 Prepared by: Sedalia Muta  Exercises - Prone Knee Flexion  - 1 x daily - 1 sets - 10 reps - 3 hold - Bent Knee Fallouts  - 1 x daily - 1 sets - 5- 10 reps - 3 hold - Seated Knee Extension AROM  - 2 x daily - 1 sets - 5- 10 reps  ASSESSMENT:  CLINICAL IMPRESSION: Patient presents with primary complaint of  pain in R hamstring. + imaging and symptoms of partial tear. She has decreased hamstring mobility, and weakness in muscles vs L side. She also has mild pain in groin today with hip mobility. Pt with decreased ability for full functional activities, IADLS, exercise, and community activity due to pain and deficit. Pt will  benefit from skilled PT to improve deficits and pain and to return to PLOF.   OBJECTIVE IMPAIRMENTS: decreased activity tolerance, decreased mobility,  difficulty walking, decreased ROM, decreased strength, increased muscle spasms, impaired flexibility, and pain.   ACTIVITY LIMITATIONS: bending, standing, squatting, stairs, hygiene/grooming, and locomotion level  PARTICIPATION LIMITATIONS: meal prep, cleaning, laundry, driving, community activity, and yard work  PERSONAL FACTORS:  none   are also affecting patient's functional outcome.   REHAB POTENTIAL: Good  CLINICAL DECISION MAKING: Stable/uncomplicated  EVALUATION COMPLEXITY: Low   GOALS: Goals reviewed with patient? Yes  SHORT TERM GOALS: Target date: 10/14/2022   Pt to be independent with final HEP  Goal status: INITIAL  2.  Pt to demo ability for full ROM for LAQ in seated position, without pain in R hamstring.   Goal status: INITIAL    LONG TERM GOALS: Target date: 11/25/2022   Pt to be independent with final HEP   Goal status: INITIAL  2.  Pt to demo full ROM for R hamstring without pain, to improve ability for IADLs.   Goal status: INITIAL  3.  Pt to demo strength of R knee flexion to be at least 4+/5 to improve ability for functional activities, stairs, and return to exercise.   Goal status: INITIAL  4.  Pt to demo ability for bend, squat, and hip hinge motion with optimal mechanics, and no pain in hamstring, to improve ability for IADLs and return to exercise.   Goal status: INITIAL  5.  Pt to report pain 0-1/10 in R hamstring with activity and walking program.   Goal status: INITIAL   PLAN:  PT FREQUENCY: 1-2x/week  PT DURATION: 8 weeks  PLANNED INTERVENTIONS: Therapeutic exercises, Therapeutic activity, Neuromuscular re-education, Patient/Family education, Self Care, Joint mobilization, Joint manipulation, Stair training, Orthotic/Fit training, DME instructions, Aquatic Therapy, Dry Needling, Electrical stimulation, Cryotherapy, Moist heat, Taping, Ultrasound, Ionotophoresis 4mg /ml Dexamethasone, Manual therapy,  Vasopneumatic device,  Traction, Spinal manipulation, Spinal mobilization,Balance training, Gait training,   PLAN FOR NEXT SESSION:    Sedalia Muta, PT, DPT 12:00 PM  09/30/22

## 2022-10-05 ENCOUNTER — Encounter: Payer: Self-pay | Admitting: Podiatry

## 2022-10-08 ENCOUNTER — Ambulatory Visit: Payer: Medicare Other | Admitting: Physical Therapy

## 2022-10-08 ENCOUNTER — Encounter: Payer: Self-pay | Admitting: Physical Therapy

## 2022-10-08 DIAGNOSIS — M25551 Pain in right hip: Secondary | ICD-10-CM | POA: Diagnosis not present

## 2022-10-08 DIAGNOSIS — M79604 Pain in right leg: Secondary | ICD-10-CM

## 2022-10-08 NOTE — Therapy (Signed)
OUTPATIENT PHYSICAL THERAPY LOWER EXTREMITY TREATMENT   Patient Name: Dana Dillon MRN: [id number] DOB:1955/04/02, 67 y.o., female Today's Date: 10/08/2022  END OF SESSION:  PT End of Session - 10/08/22 1230     Visit Number 2    Number of Visits 16    Date for PT Re-Evaluation 11/25/22    Authorization Type [employer] Medicare    PT Start Time 1231    PT Stop Time 1300    PT Time Calculation (min) 29 min    Activity Tolerance Patient tolerated treatment well    Behavior During Therapy WFL for tasks assessed/performed             Past Medical History:  Diagnosis Date   Hot flashes    Hypertension    LYMPHOPENIA 05/04/2007   saw oncology- later normalized. ? virus related   RHINITIS 12/03/2009   Past Surgical History:  Procedure Laterality Date   CARPAL TUNNEL RELEASE     right side. Dr. 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   SUBJECTIVE:   SUBJECTIVE STATEMENT: Pt reports Pain in a few joints today. Pain in  L  arch of foot and R shoulder pain. Her leg, hamstring is hurting only at times with certain motions, and with bending over.    Eval: August 22, Pt slipped at home, she was kneeling, and knees separated, heard a pop, had initial pain. Pain was not awful, was able to go away that weekend. But in airport a few days later she turned and had significant pain. She thinks tear happened at [employer].  She was found to have partial Tear of R Hamstring/proximal.  Previous: sometimes has pain in hips when laying on her side.  Soreness at R HS insertion.  Feels she is 97 % better at this time, but still hurts when turning certain ways.  Exercise: walk/jog on t-mill for 30 min, and weight machines at home.  R shoulder- pain, would like to review weight mechaics.  Walking: no pain at this time, but has not been going very far.    PERTINENT HISTORY: HTN  PAIN:  Are you having pain? Yes: NPRS scale: 0-2 /10 Pain location: R Hamstring  Pain description: Tight, pull  Aggravating factors: Bending over, getting in/out of car,  Relieving factors: none stated    PRECAUTIONS: None  WEIGHT BEARING RESTRICTIONS: No  FALLS:  Has patient fallen in last 6 months? No  PLOF: Independent  PATIENT GOALS:  Decreased pain   NEXT MD VISIT:  OBJECTIVE:   DIAGNOSTIC FINDINGS:  US Impression:  Proximal hamstring tendon partial tearing  PATIENT SURVEYS: FOTO: initial:  71.9  COGNITION: Overall cognitive status: Within functional limits for tasks assessed     SENSATION: WFL  EDEMA:   POSTURE:    No Significant postural limitations  PALPATION:   mild tenderness at R HS insertion, mild tenderness in mid and distal HS as well.  Mild tenderness in R groin line, none at pubic symphysis.    LOWER EXTREMITY ROM: Hips: R hip flexion, mild limitation due to pull on hamstring,  Knees: WFL    LOWER EXTREMITY MMT:  MMT Left eval Right  eval  Hip flexion 4 4-  Hip extension    Hip abduction 4 4-   Hip adduction    Hip internal rotation    Hip external rotation    Knee flexion 5 4-  Knee extension 5 4  Ankle dorsiflexion    Ankle plantarflexion    Ankle inversion    Ankle eversion     (Blank rows = not tested)   FUNCTIONAL TESTS:    GAIT: Distance walked: 136ft Assistive device utilized: None Level of assistance: Complete Independence Comments: early heel rise in terminal stance    TODAY'S TREATMENT:                                                                                                                              DATE:   10/08/2022 Therapeutic Exercise: Aerobic: Supine:   hip ER fallouts for adductor stretch x 10, slow, gentle.  Clams GTB x 20; Supine march with TA x 15;  Prone:   hamstring curl/ROM 3 lb , 2 x 10 slow lowering.   Seated:  LAQ (for light hamstring stretch) x 5 on R- inc pulling/pain Standing:  Step up 4 in x 5, and 6 in x 10 bil;  Stretches:  Neuromuscular Re-education: Manual Therapy: Therapeutic Activity: Self Care:   Previous:  Therapeutic Exercise: Aerobic: Supine:  hip ER fallouts for adductor stretch x 10, slow, gentle.  Prone:  hamstring curl/ROM x 10;  Seated:  LAQ (for light hamstring stretch) 2 x 5,, partial ROM;  Standing: Stretches:  Neuromuscular Re-education: Manual Therapy: Therapeutic Activity: Self Care:    PATIENT EDUCATION:  Education details:updated and reviewed HEP  Person educated: Patient Education method: Explanation, Demonstration, Tactile cues, Verbal cues, and Handouts Education comprehension: verbalized understanding, returned demonstration, verbal cues required, tactile cues required, and needs further education   HOME EXERCISE PROGRAM: Access Code: [email or web address]  ASSESSMENT:  CLINICAL IMPRESSION: 10/08/2022 Pt with No tenderness to palpate hamstring today. No pain with active HS curl in prone. Will benefit from progressive/light strengthening as able. Will review hip hinge and squat/bend motion  next visit. She has no pain in hip/groin today. She was 15 min late to appt today, got appt time wrong.   Patient presents with primary complaint of  pain in R hamstring. + imaging and symptoms  of partial tear. She has decreased hamstring mobility, and weakness in muscles vs L side. She also has mild pain in groin today with hip mobility. Pt with decreased ability for full functional activities, IADLS, exercise, and community activity due to pain and deficit. Pt will  benefit from skilled PT to improve deficits and pain and to return to PLOF.   OBJECTIVE IMPAIRMENTS: decreased activity tolerance, decreased mobility, difficulty walking, decreased ROM, decreased strength, increased muscle spasms, impaired flexibility, and pain.   ACTIVITY LIMITATIONS: bending, standing, squatting, stairs, hygiene/grooming, and locomotion level  PARTICIPATION LIMITATIONS: meal prep, cleaning, laundry, driving, community activity, and yard work  PERSONAL FACTORS:  none   are also affecting patient's functional outcome.   REHAB POTENTIAL: Good  CLINICAL DECISION MAKING: Stable/uncomplicated  EVALUATION COMPLEXITY: Low   GOALS: Goals reviewed with patient? Yes  SHORT TERM GOALS: Target date: 10/14/2022   Pt to be independent with final HEP  Goal status: INITIAL  2.  Pt to demo ability for full ROM for LAQ in seated position, without pain in R hamstring.   Goal status: INITIAL    LONG TERM GOALS: Target date: 11/25/2022   Pt to be independent with final HEP   Goal status: INITIAL  2.  Pt to demo full ROM for R hamstring without pain, to improve ability for IADLs.   Goal status: INITIAL  3.  Pt to demo strength of R knee flexion to be at least 4+/5 to improve ability for functional activities, stairs, and return to exercise.   Goal status: INITIAL  4.  Pt to demo ability for bend, squat, and hip hinge motion with optimal mechanics, and no pain in hamstring, to improve ability for IADLs and  return to exercise.   Goal status: INITIAL  5.  Pt to report pain 0-1/10 in R hamstring with activity and walking program.   Goal status: INITIAL   PLAN:  PT FREQUENCY: 1-2x/week  PT DURATION: 8 weeks  PLANNED INTERVENTIONS: Therapeutic exercises, Therapeutic activity, Neuromuscular re-education, Patient/Family education, Self Care, Joint mobilization, Joint manipulation, Stair training, Orthotic/Fit training, DME instructions, Aquatic Therapy, Dry Needling, Electrical stimulation, Cryotherapy, Moist heat, Taping, Ultrasound, Ionotophoresis 4mg /ml Dexamethasone, Manual therapy,  Vasopneumatic device, Traction, Spinal manipulation, Spinal mobilization,Balance training, Gait training,   PLAN FOR NEXT SESSION:    Sedalia Muta, PT, DPT 9:39 PM  10/08/22

## 2022-10-12 ENCOUNTER — Ambulatory Visit: Payer: Medicare Other | Admitting: Physical Therapy

## 2022-10-12 ENCOUNTER — Encounter: Payer: Self-pay | Admitting: Physical Therapy

## 2022-10-12 DIAGNOSIS — M25551 Pain in right hip: Secondary | ICD-10-CM

## 2022-10-12 DIAGNOSIS — M79604 Pain in right leg: Secondary | ICD-10-CM

## 2022-10-12 NOTE — Therapy (Signed)
OUTPATIENT PHYSICAL THERAPY LOWER EXTREMITY TREATMENT   Patient Name: Dana Dillon MRN: [id number] DOB:01/18/1955, 67 y.o., female Today's Date: 10/12/2022  END OF SESSION:  PT End of Session - 10/12/22 1211     Visit Number 3    Number of Visits 16    Date for PT Re-Evaluation 11/25/22    Authorization Type [employer] Medicare    PT Start Time 1215    PT Stop Time 1256    PT Time Calculation (min) 41 min    Activity Tolerance Patient tolerated treatment well    Behavior During Therapy WFL for tasks assessed/performed             Past Medical History:  Diagnosis Date   Hot flashes    Hypertension    LYMPHOPENIA 05/04/2007   saw oncology- later normalized. ? virus related   RHINITIS 12/03/2009   Past Surgical History:  Procedure Laterality Date   CARPAL TUNNEL RELEASE     right side. Dr. 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   SUBJECTIVE:   SUBJECTIVE STATEMENT:  Pt reports Pain in a few joints today. Pain in  L  arch of foot and R shoulder pain. Her leg, hamstring is hurting only at times with certain motions, and with bending over.    Eval: August 22, Pt slipped at home, she was kneeling, and knees separated, heard a pop, had initial pain. Pain was not awful, was able to go away that weekend. But in airport a few days later she turned and had significant pain. She thinks tear happened at [employer].  She was found to have partial Tear of R Hamstring/proximal.  Previous: sometimes has pain in hips when laying on her side.  Soreness at R HS insertion.  Feels she is 97 % better at this time, but still hurts when turning certain ways.  Exercise: walk/jog on t-mill for 30 min, and weight machines at home.  R shoulder- pain, would like to review weight mechaics.  Walking: no pain at this time, but has not been going very far.    PERTINENT HISTORY: HTN  PAIN:  Are you having pain? Yes: NPRS scale: 0-2 /10 Pain location: R Hamstring  Pain description: Tight, pull  Aggravating factors: Bending over, getting in/out of car,  Relieving factors: none stated    PRECAUTIONS: None  WEIGHT BEARING RESTRICTIONS: No  FALLS:  Has patient fallen in last 6 months? No  PLOF: Independent  PATIENT GOALS:  Decreased pain   NEXT MD  VISIT:   OBJECTIVE:   DIAGNOSTIC FINDINGS:  US Impression:  Proximal hamstring tendon partial tearing  PATIENT SURVEYS: FOTO: initial:  71.9  COGNITION: Overall cognitive status: Within functional limits for tasks assessed     SENSATION: WFL  EDEMA:   POSTURE:    No Significant postural limitations  PALPATION:   mild tenderness at R HS insertion, mild tenderness in mid and distal HS as well.  Mild tenderness in R groin line, none at pubic symphysis.    LOWER EXTREMITY ROM: Hips: R hip flexion, mild limitation due to pull on hamstring,  Knees: WFL    LOWER EXTREMITY MMT:  MMT Left eval Right  eval  Hip flexion 4 4-  Hip extension    Hip abduction 4 4-   Hip adduction    Hip internal rotation    Hip external rotation    Knee flexion 5 4-  Knee extension 5 4  Ankle dorsiflexion    Ankle plantarflexion    Ankle inversion    Ankle eversion     (Blank rows = not tested)   FUNCTIONAL TESTS:    GAIT: Distance walked: 137ft Assistive device utilized: None Level of assistance: Complete Independence Comments: early heel rise in terminal stance    TODAY'S TREATMENT:                                                                                                                              DATE:   10/12/2022 Therapeutic Exercise: Aerobic: Supine:   hip ER fallouts for adductor stretch x 10,  Bridging 2 x 10 ;  Prone:    Seated:  Seated HSC red t-band 2 x 10 with education on HEP.    Standing:   hip ext 2 x 10 bil; at counter;  marching x 15 no hands; squat to mat table x 10;  Heel raises- review of optimal form for pl fascia pain;  Stretches:  SKTC 30 sec x 3 bil;  Neuromuscular Re-education: Manual Therapy: Therapeutic Activity: Self Care:    Therapeutic Exercise: Aerobic: Supine:   hip ER fallouts for adductor stretch x 10, slow, gentle.  Clams GTB x 20; Supine march with TA x 15;  Prone:   hamstring curl/ROM 3 lb , 2 x 10 slow lowering.   Seated:  LAQ (for light hamstring stretch) x 5 on R- inc pulling/pain Standing:  Step up 4 in x 5, and 6 in x 10 bil;  Stretches:  Neuromuscular Re-education: Manual Therapy: Therapeutic Activity: Self Care:   Previous:  Therapeutic Exercise: Aerobic: Supine:  hip ER fallouts for adductor stretch x 10, slow, gentle.  Prone:  hamstring curl/ROM x 10;  Seated:  LAQ (for light hamstring stretch) 2 x 5,, partial ROM;  Standing: Stretches:  Neuromuscular Re-education: Manual Therapy: Therapeutic Activity: Self Care:    PATIENT EDUCATION:  Education details:updated and reviewed HEP  Person educated: Patient Education method: Explanation, Demonstration, Tactile cues, Verbal  cues, and  Handouts Education comprehension: verbalized understanding, returned demonstration, verbal cues required, tactile cues required, and needs further education   HOME EXERCISE PROGRAM: Access Code: [email or web address]  ASSESSMENT:  CLINICAL IMPRESSION: 10/12/2022 Pt with overall improving pain and function. She has good ability for hamstring activation today with HSC and bridging without pain. Plan to progress strength as able.   Patient presents with primary complaint of  pain in R hamstring. + imaging and symptoms of partial tear. She has decreased hamstring mobility, and weakness in muscles vs L side. She also has mild pain in groin today with hip mobility. Pt with decreased ability for full functional activities, IADLS, exercise, and community activity due to pain and deficit. Pt will  benefit from skilled PT to improve deficits and pain and to return to PLOF.   OBJECTIVE IMPAIRMENTS: decreased activity tolerance, decreased mobility, difficulty walking, decreased ROM, decreased strength, increased muscle spasms, impaired flexibility, and pain.   ACTIVITY LIMITATIONS: bending, standing, squatting, stairs, hygiene/grooming, and locomotion level  PARTICIPATION LIMITATIONS: meal prep, cleaning, laundry, driving, community activity, and yard work  PERSONAL FACTORS:  none   are also affecting patient's functional outcome.   REHAB POTENTIAL: Good  CLINICAL DECISION MAKING: Stable/uncomplicated  EVALUATION COMPLEXITY: Low   GOALS: Goals reviewed with patient? Yes  SHORT TERM GOALS: Target date: 10/14/2022   Pt to be independent with final HEP  Goal status: INITIAL  2.  Pt to demo ability for full ROM for LAQ in seated position, without pain in R hamstring.   Goal status: INITIAL    LONG TERM GOALS: Target date: 11/25/2022   Pt to be independent with final HEP   Goal status: INITIAL  2.  Pt to demo full ROM for R hamstring without pain, to improve ability for IADLs.   Goal  status: INITIAL  3.  Pt to demo strength of R knee flexion to be at least 4+/5 to improve ability for functional activities, stairs, and return to exercise.   Goal status: INITIAL  4.  Pt to demo ability for bend, squat, and hip hinge motion with optimal mechanics, and no pain in hamstring, to improve ability for IADLs and return to exercise.   Goal status: INITIAL  5.  Pt to report pain 0-1/10 in R hamstring with activity and walking program.   Goal status: INITIAL   PLAN:  PT FREQUENCY: 1-2x/week  PT DURATION: 8 weeks  PLANNED INTERVENTIONS: Therapeutic exercises, Therapeutic activity, Neuromuscular re-education, Patient/Family education, Self Care, Joint mobilization, Joint manipulation, Stair training, Orthotic/Fit training, DME instructions, Aquatic Therapy, Dry Needling, Electrical stimulation, Cryotherapy, Moist heat, Taping, Ultrasound, Ionotophoresis 4mg /ml Dexamethasone, Manual therapy,  Vasopneumatic device, Traction, Spinal manipulation, Spinal mobilization,Balance training, Gait training,   PLAN FOR NEXT SESSION:    Sedalia Muta, PT, DPT 1:13 PM  10/12/22

## 2022-10-15 ENCOUNTER — Encounter: Payer: Medicare Other | Admitting: Physical Therapy

## 2022-10-19 ENCOUNTER — Ambulatory Visit: Payer: Medicare Other | Admitting: Physical Therapy

## 2022-10-19 ENCOUNTER — Encounter: Payer: Self-pay | Admitting: Physical Therapy

## 2022-10-19 ENCOUNTER — Encounter: Payer: Medicare Other | Admitting: Physical Therapy

## 2022-10-19 DIAGNOSIS — M79604 Pain in right leg: Secondary | ICD-10-CM | POA: Diagnosis not present

## 2022-10-19 DIAGNOSIS — M25551 Pain in right hip: Secondary | ICD-10-CM | POA: Diagnosis not present

## 2022-10-19 NOTE — Therapy (Unsigned)
OUTPATIENT PHYSICAL THERAPY LOWER EXTREMITY TREATMENT   Patient Name: Dana Dillon MRN: [id number] DOB:30-Jun-1955, 67 y.o., female Today's Date: 10/19/2022  END OF SESSION:  PT End of Session - 10/19/22 1215     Visit Number 4    Number of Visits 16    Date for PT Re-Evaluation 11/25/22    Authorization Type [employer] Medicare    PT Start Time 1218    PT Stop Time 1300    PT Time Calculation (min) 42 min    Activity Tolerance Patient tolerated treatment well    Behavior During Therapy WFL for tasks assessed/performed             Past Medical History:  Diagnosis Date   Hot flashes    Hypertension    LYMPHOPENIA 05/04/2007   saw oncology- later normalized. ? virus related   RHINITIS 12/03/2009   Past Surgical History:  Procedure Laterality Date   CARPAL TUNNEL RELEASE     right side. Dr. 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   SUBJECTIVE:   SUBJECTIVE STATEMENT: Pt reports increased pain in  L arch of foot and R  shoulder pain. She is getting injection in shoulder tomorrow. Her leg/ R hamstring is feeling pretty good.   Eval: August 22, Pt slipped at home, she was kneeling, and knees separated, heard a pop, had initial pain. Pain was not awful, was able to go away that weekend. But in airport a few days later she turned and had significant pain. She thinks tear happened at [employer].  She was found to have partial Tear of R Hamstring/proximal.  Previous: sometimes has pain in hips when laying on her side.  Soreness at R HS insertion.  Feels she is 97 % better at this time, but still hurts when turning certain ways.  Exercise: walk/jog on t-mill for 30 min, and weight machines at home.  R shoulder- pain, would like to review weight mechaics.  Walking: no pain at this time, but has not been going very far.    PERTINENT HISTORY: HTN  PAIN:  Are you having pain? Yes: NPRS scale: 0-2 /10 Pain location: R Hamstring  Pain description: Tight, pull  Aggravating factors: Bending over, getting in/out of car,  Relieving factors: none stated    PRECAUTIONS: None  WEIGHT BEARING RESTRICTIONS: No  FALLS:  Has patient fallen in last 6 months? No  PLOF: Independent  PATIENT GOALS:  Decreased pain   NEXT MD VISIT:   OBJECTIVE:   DIAGNOSTIC  FINDINGS:  US Impression:  Proximal hamstring tendon partial tearing  PATIENT SURVEYS: FOTO: initial:  71.9  COGNITION: Overall cognitive status: Within functional limits for tasks assessed     SENSATION: WFL  EDEMA:   POSTURE:    No Significant postural limitations  PALPATION:   mild tenderness at R HS insertion, mild tenderness in mid and distal HS as well.  Mild tenderness in R groin line, none at pubic symphysis.    LOWER EXTREMITY ROM: Hips: R hip flexion, mild limitation due to pull on hamstring,  Knees: WFL    LOWER EXTREMITY MMT:  MMT Left eval Right  eval  Hip flexion 4 4-  Hip extension    Hip abduction 4 4-  Hip adduction     Hip internal rotation    Hip external rotation    Knee flexion 5 4-  Knee extension 5 4  Ankle dorsiflexion    Ankle plantarflexion    Ankle inversion    Ankle eversion     (Blank rows = not tested)   FUNCTIONAL TESTS:    GAIT: Distance walked: 142ft Assistive device utilized: None Level of assistance: Complete Independence Comments: early heel rise in terminal stance    TODAY'S TREATMENT:                                                                                                                              DATE:   10/20/2022 Therapeutic Exercise: Aerobic:  Supine:    Bridging 2 x 10 ;  Prone:    Seated:  Seated HSC red t-band 2 x 10;  LAQ 2.5 lb 2 x 10 bil  ; seated hip hinge motion x 10;  Standing:     marching x 15 no hands;  squat to mat table x 10;   Heel raises- review of optimal form for pl fascia pain;  Hip hinge/dead lift motion x 10, then x 10 with stick.  Step ups 6 in x 10 bil;  Stretches:  SKTC 30 sec x 3 bil;  Neuromuscular Re-education: Manual Therapy: Therapeutic Activity: Self Care:    Therapeutic Exercise: Aerobic: Supine:   hip ER fallouts for adductor stretch x 10, slow, gentle.  Clams GTB x 20; Supine march with TA x 15;  Prone:   hamstring curl/ROM 3 lb , 2 x 10 slow lowering.   Seated:  LAQ (for light hamstring stretch) x 5 on R- inc pulling/pain Standing:  Step up 4 in x 5, and 6 in x 10 bil;  Stretches:  Neuromuscular Re-education: Manual Therapy: Therapeutic Activity: Self Care:   Previous:  Therapeutic Exercise: Aerobic: Supine:  hip ER fallouts for adductor stretch x 10, slow, gentle.  Prone:  hamstring curl/ROM x 10;  Seated:  LAQ (for light hamstring stretch) 2 x 5,, partial ROM;  Standing: Stretches:  Neuromuscular Re-education: Manual Therapy: Therapeutic Activity: Self Care:    PATIENT EDUCATION:  Education details:updated and reviewed HEP  Person educated:  Patient Education method: Explanation,  Demonstration, Tactile cues, Verbal cues, and Handouts Education comprehension: verbalized understanding, returned demonstration, verbal cues required, tactile cues required, and needs further education   HOME EXERCISE PROGRAM: Access Code: [email or web address]  ASSESSMENT:  CLINICAL IMPRESSION: 10/19/2022 Pt with overall improving pain and function. She has good ability for hamstring activation today with good ability for progression of activities and stairs. She has difficulty with hip hinge motion, and will benefit from continued work on this. Recommended updating footwear as able for foot pain, likely getting increased pl fascia pain from R leg being sore and increased pressure on L foot.   Patient presents with primary complaint of  pain in R hamstring. + imaging and symptoms of partial tear. She has decreased hamstring mobility, and weakness in muscles vs L side. She also has mild pain in groin today with hip mobility. Pt with decreased ability for full functional activities, IADLS, exercise, and community activity due to pain and deficit. Pt will  benefit from skilled PT to improve deficits and pain and to return to PLOF.   OBJECTIVE IMPAIRMENTS: decreased activity tolerance, decreased mobility, difficulty walking, decreased ROM, decreased strength, increased muscle spasms, impaired flexibility, and pain.   ACTIVITY LIMITATIONS: bending, standing, squatting, stairs, hygiene/grooming, and locomotion level  PARTICIPATION LIMITATIONS: meal prep, cleaning, laundry, driving, community activity, and yard work  PERSONAL FACTORS:  none   are also affecting patient's functional outcome.   REHAB POTENTIAL: Good  CLINICAL DECISION MAKING: Stable/uncomplicated  EVALUATION COMPLEXITY: Low   GOALS: Goals reviewed with patient? Yes  SHORT TERM GOALS: Target date: 10/14/2022   Pt to be independent with final HEP  Goal status: INITIAL  2.  Pt to demo ability for full ROM for LAQ in seated position,  without pain in R hamstring.   Goal status: INITIAL    LONG TERM GOALS: Target date: 11/25/2022   Pt to be independent with final HEP   Goal status: INITIAL  2.  Pt to demo full ROM for R hamstring without pain, to improve ability for IADLs.   Goal status: INITIAL  3.  Pt to demo strength of R knee flexion to be at least 4+/5 to improve ability for functional activities, stairs, and return to exercise.   Goal status: INITIAL  4.  Pt to demo ability for bend, squat, and hip hinge motion with optimal mechanics, and no pain in hamstring, to improve ability for IADLs and return to exercise.   Goal status: INITIAL  5.  Pt to report pain 0-1/10 in R hamstring with activity and walking program.   Goal status: INITIAL   PLAN:  PT FREQUENCY: 1-2x/week  PT DURATION: 8 weeks  PLANNED INTERVENTIONS: Therapeutic exercises, Therapeutic activity, Neuromuscular re-education, Patient/Family education, Self Care, Joint mobilization, Joint manipulation, Stair training, Orthotic/Fit training, DME instructions, Aquatic Therapy, Dry Needling, Electrical stimulation, Cryotherapy, Moist heat, Taping, Ultrasound, Ionotophoresis 4mg /ml Dexamethasone, Manual therapy,  Vasopneumatic device, Traction, Spinal manipulation, Spinal mobilization,Balance training, Gait training,   PLAN FOR NEXT SESSION:    Sedalia Muta, PT, DPT 2:17 PM  10/20/22

## 2022-10-20 ENCOUNTER — Ambulatory Visit: Payer: Medicare Other | Admitting: Sports Medicine

## 2022-10-20 VITALS — HR 52 | Ht 62.0 in | Wt 162.0 lb

## 2022-10-20 DIAGNOSIS — M79672 Pain in left foot: Secondary | ICD-10-CM | POA: Diagnosis not present

## 2022-10-20 DIAGNOSIS — M25511 Pain in right shoulder: Secondary | ICD-10-CM

## 2022-10-20 DIAGNOSIS — S76311D Strain of muscle, fascia and tendon of the posterior muscle group at thigh level, right thigh, subsequent encounter: Secondary | ICD-10-CM | POA: Diagnosis not present

## 2022-10-20 DIAGNOSIS — G8929 Other chronic pain: Secondary | ICD-10-CM

## 2022-10-20 NOTE — Patient Instructions (Signed)
Ankle HEP  Continue PT  Tylenol [id number] mg 2-3 times a day for pain relief  Voltaren gel over areas of pain  Can use heat over ankle and calf 4-6 week follow up

## 2022-10-20 NOTE — Progress Notes (Signed)
[name] [name] [address] [place] [address] Phone: [phone]   Assessment and Plan:     1. Chronic right shoulder pain -Chronic with exacerbation, subsequent visit - Recurrent flare of chronic right shoulder pain, most consistent with rotator cuff pathology versus subacromial bursitis - Patient previously had nearly 4 months relief with previous subacromial CSI on 06/23/2022 and elects for repeat CSI.  Tolerated well per note below - Recommend using Tylenol for day-to-day pain relief - Can use remaining meloxicam as needed for breakthrough pain.  Recommend using chronic NSAIDs no more frequently than 1 time per week - May use topical Voltaren gel and heating pads over areas of pain  Procedure: Subacromial Injection Side: Right  Risks explained and consent was given verbally. The site was cleaned with alcohol prep. A steroid injection was performed from posterior approach using 2mL of 1% lidocaine without epinephrine and 1mL of kenalog 40mg /ml. This was well tolerated and resulted in symptomatic relief.  Needle was removed, hemostasis achieved, and post injection instructions were explained.   Pt was advised to call or return to clinic if these symptoms worsen or fail to improve as anticipated.   2. Partial hamstring tear, right, subsequent encounter 3. Left foot pain -Subacute, improving, subsequent visit - Overall significant improvement in right hamstring pain consistent with improving partial tearing of proximal hamstring.  Patient is experiencing new left foot pain likely due to compensation and overuse of left posterior tibialis muscle and tendon based on HPI and physical exam. - As patient's gait returns to normal, patient's hamstring and left foot pain should continue to improve - Continue HEP and physical therapy.  Start new HEP for ankle - Recommend using Tylenol for day-to-day pain relief - Can use remaining meloxicam  as needed for breakthrough pain.  Recommend using chronic NSAIDs no more frequently than 1 time per week - May use topical Voltaren gel and heating pads over areas of pain   15 additional minutes spent for educating Therapeutic Home Exercise Program.  This included exercises focusing on stretching, strengthening, with focus on eccentric aspects.   Long term goals include an improvement in range of motion, strength, endurance as well as avoiding reinjury. Patient's frequency would include in 1-2 times a day, 3-5 times a week for a duration of 6-12 weeks. Proper technique shown and discussed handout in great detail with ATC.  All questions were discussed and answered.    Pertinent previous records reviewed include none   Follow Up: 4 to 6 weeks for reevaluation.  Could consider advanced imaging   Subjective:   I, [name], am serving as a Neurosurgeon for Doctor Richardean Sale   Chief Complaint: hamstring pain    HPI:    09/08/2022 Patient is a 67 year old female complaining of hamstring pain. Patient states was packing and was kneeling on the floor and her legs started sliding in a splits motion and she heard a pop on the right hamstring. Patient took a flexeril that night. Patient was in [place] and had to use the wheels chair and she started to feel better, but when she was in the airport she turned to grab her suitcase and turned wrong and the pain is back. Using tylenol, flexeril, heating pad, rest. Just feels super tight and pulled    09/22/2022 Patient states that she has improved . Will get a twinge of pain   10/20/2022 Patient states she is definitely improving, shoulder flare here  for a tune up , left foot pain they are working out in PT     Relevant Historical Information: Hypertension, hypothyroidism  Additional pertinent review of systems negative.   Current Outpatient Medications:    Cholecalciferol (VITAMIN D3 PO), Take 5,000 Units by mouth daily., Disp: , Rfl:    ciclopirox  (PENLAC) 8 % solution, APPLY DAILY OVER NAIL AND SURROUNDING SKIN AND OVER PREVIOUS COAT. AFTER SEVEN (7) DAYS, MAY REMOVE WITH ALCOHOL AND CONTINUE CYCLE., Disp: 6.6 mL, Rfl: 0   cyclobenzaprine (FLEXERIL) 5 MG tablet, Take 1 tablet (5 mg total) by mouth at bedtime., Disp: 30 tablet, Rfl: 0   levothyroxine (SYNTHROID) 50 MCG tablet, TAKE 1 TABLET BY MOUTH IN THE  MORNING, Disp: 100 tablet, Rfl: 2   losartan-hydrochlorothiazide (HYZAAR) 100-25 MG tablet, TAKE 1 TABLET BY MOUTH DAILY, Disp: 100 tablet, Rfl: 2   MAGNESIUM CITRATE PO, Take by mouth., Disp: , Rfl:    meloxicam (MOBIC) 15 MG tablet, Take 1 tablet (15 mg total) by mouth daily., Disp: 30 tablet, Rfl: 0   rosuvastatin (CRESTOR) 10 MG tablet, Take 1 tablet (10 mg total) by mouth 3 (three) times a week. Take 1 tablet three times a week, Disp: 70 tablet, Rfl: 1   Objective:     Vitals:   10/20/22 1258  Pulse: (!) 52  SpO2: 98%  Weight: 162 lb (73.5 kg)  Height: 5\' 2"  (1.575 m)      Body mass index is 29.63 kg/m.    Physical Exam:    General: awake, alert, and oriented no acute distress, nontoxic Skin: no suspicious lesions or rashes Neuro:sensation intact distally with no deficits, normal muscle tone, no atrophy, strength 5/5 in all tested lower ext groups Psych: normal mood and affect, speech clear   Right hamstring/hip: No deformity, swelling or wasting No Ecchymosis present   Hip ROM Flexion 90, ext 30, IR 45, ER 45 Knee flexion intact Patient  able to flex knee against resistance though reproduced proximal hamstring discomfort TTP mildly proximal hamstring musculature and tendon origin NTTP over the hip flexors, greater trochanter, gluteal musculature, si joint, lumbar spine Gait normal Single leg squat with right leg reproduced mild proximal hamstring discomfort   Right shoulder:  No deformity, swelling or muscle wasting No scapular winging FF 180, abd 180, int 0, ext 90 with painful end arc in all  directions NTTP over the [place], clavicle, ac, coracoid, biceps groove, humerus, deltoid, trapezius, cervical spine Positive Hawkins, empty can, O'Brien, speeds Neg neer, crossarm, subscap liftoff,   Neg ant drawer, sulcus sign, apprehension Negative Spurling's test bilat Decreased sidebending, otherwise FROM of neck    Electronically signed by:  [name] [name] 1:19 PM 10/20/22

## 2022-10-22 ENCOUNTER — Encounter: Payer: Medicare Other | Admitting: Physical Therapy

## 2022-10-23 ENCOUNTER — Ambulatory Visit: Payer: Medicare Other | Admitting: Sports Medicine

## 2022-10-27 ENCOUNTER — Encounter: Payer: Medicare Other | Admitting: Physical Therapy

## 2022-10-29 ENCOUNTER — Ambulatory Visit: Payer: Medicare Other | Admitting: Physical Therapy

## 2022-10-29 ENCOUNTER — Encounter: Payer: Self-pay | Admitting: Physical Therapy

## 2022-10-29 DIAGNOSIS — M79604 Pain in right leg: Secondary | ICD-10-CM | POA: Diagnosis not present

## 2022-10-29 DIAGNOSIS — M25551 Pain in right hip: Secondary | ICD-10-CM | POA: Diagnosis not present

## 2022-10-29 NOTE — Therapy (Signed)
OUTPATIENT PHYSICAL THERAPY LOWER EXTREMITY TREATMENT   Patient Name: Dana Dillon MRN: [id number] DOB:06-Jan-1956, 67 y.o., female Today's Date: 10/29/2022  END OF SESSION:  PT End of Session - 10/29/22 1209     Visit Number 5    Number of Visits 16    Date for PT Re-Evaluation 11/25/22    Authorization Type [employer] Medicare    PT Start Time 1217    PT Stop Time 1256    PT Time Calculation (min) 39 min    Activity Tolerance Patient tolerated treatment well    Behavior During Therapy WFL for tasks assessed/performed             Past Medical History:  Diagnosis Date   Hot flashes    Hypertension    LYMPHOPENIA 05/04/2007   saw oncology- later normalized. ? virus related   RHINITIS 12/03/2009   Past Surgical History:  Procedure Laterality Date   CARPAL TUNNEL RELEASE     right side. Dr. 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   SUBJECTIVE:   SUBJECTIVE STATEMENT: Pt reports  pain a bit better in in  L arch of foot  , did have dr look at it.  Did just have R shoulder injection, states painful after, and is now starting to do HEP for that.  Leg is still doing very well.     Eval: August 22, Pt slipped at home, she was kneeling, and knees separated, heard a pop, had initial pain. Pain was not awful, was able to go away that weekend. But in airport a few days later she turned and had significant pain. She thinks tear happened at [employer].  She was found to have partial Tear of R Hamstring/proximal.  Previous: sometimes has pain in hips when laying on her side.  Soreness at R HS insertion.  Feels she is 97 % better at this time, but still hurts when turning certain ways.  Exercise: walk/jog on t-mill for 30 min, and weight machines at home.  R shoulder- pain, would like to review weight mechaics.  Walking: no pain at this time, but has not been going very far.    PERTINENT HISTORY: HTN  PAIN:  Are you having pain? Yes: NPRS scale: 0-2 /10 Pain location: R Hamstring  Pain description: Tight, pull  Aggravating factors: Bending over, getting in/out of car,  Relieving factors: none stated    PRECAUTIONS: None  WEIGHT BEARING RESTRICTIONS: No  FALLS:  Has patient fallen in last 6 months? No  PLOF: Independent  PATIENT GOALS:  Decreased pain   NEXT MD VISIT:   OBJECTIVE:   DIAGNOSTIC FINDINGS:  US Impression:  Proximal hamstring tendon partial tearing  PATIENT SURVEYS: FOTO: initial:   71.9 Foto:  visit  5:  93.7   COGNITION: Overall cognitive status: Within functional limits for tasks assessed     SENSATION: WFL  EDEMA:   POSTURE:    No Significant postural limitations  PALPATION:   mild tenderness at R HS insertion, mild tenderness in mid and distal HS as well.  Mild tenderness in R groin line, none at pubic symphysis.    LOWER EXTREMITY ROM: Hips: R hip flexion, mild limitation due to pull on hamstring,  Knees: WFL    LOWER EXTREMITY MMT:  MMT Left eval Right  eval  Right   Hip flexion 4 4-   Hip extension     Hip abduction 4 4-   Hip adduction     Hip internal rotation     Hip external rotation     Knee flexion 5 4-   Knee extension 5 4   Ankle dorsiflexion     Ankle plantarflexion     Ankle inversion     Ankle eversion      (Blank rows = not tested)   FUNCTIONAL TESTS:    GAIT: Distance walked: 19ft Assistive device utilized: None Level of assistance: Complete Independence Comments: early heel rise in terminal stance    TODAY'S TREATMENT:                                                                                                                              DATE:   10/29/2022 Therapeutic Exercise: Aerobic:  Level 1 x 6 min;  Supine:   (review bridge next visit)  Prone:    Seated:  Seated HSC Green  t-band 2 x 10;   LAQ 3 lb 2 x 10 bil ; seated hip hinge motion x 10;  Standing:    marching x 15 no hands;  squat to mat table x 10;   Hip hinge/dead lift motion x 10,  x5 with 15lb pick up  Step ups 6 in x 10 bil;  Stretches:   Neuromuscular Re-education: Manual Therapy: Therapeutic Activity: Self Care:    Therapeutic Exercise: Aerobic: Supine:   hip ER fallouts for adductor stretch x 10, slow, gentle.  Clams GTB x 20; Supine march with TA x 15;  Prone:   hamstring curl/ROM 3 lb , 2 x 10 slow lowering.   Seated:  LAQ (for light hamstring stretch) x 5 on R- inc pulling/pain Standing:  Step up 4 in x 5, and 6 in x 10 bil;  Stretches:  Neuromuscular Re-education: Manual Therapy: Therapeutic Activity: Self Care:   Previous:  Therapeutic Exercise: Aerobic: Supine:  hip ER fallouts for adductor stretch x 10, slow, gentle.  Prone:  hamstring curl/ROM x 10;  Seated:  LAQ (for light hamstring stretch) 2  x 5,, partial ROM;  Standing: Stretches:  Neuromuscular Re-education: Manual Therapy: Therapeutic Activity: Self Care:    PATIENT EDUCATION:  Education details:updated and reviewed HEP  Person educated:  Patient Education method: Explanation, Demonstration, Tactile cues, Verbal cues, and Handouts Education comprehension: verbalized understanding, returned demonstration, verbal cues required, tactile cues required, and needs further education   HOME EXERCISE PROGRAM: Access Code: [email or web address]  ASSESSMENT:  CLINICAL IMPRESSION: 10/29/2022  Pt with much improving pain. She is progressing well. Has been able to perform strengthening without pain. She will benefit from 1 final visit, and plan to d/c next visit if still doing very well. She is having pain in shoulder, discussed getting referral for PT if needed.   Patient presents with primary complaint of  pain in R hamstring. + imaging and symptoms of partial tear. She has decreased hamstring mobility, and weakness in muscles vs L side. She also has mild pain in groin today with hip mobility. Pt with decreased ability for full functional activities, IADLS, exercise, and community activity due to pain and deficit. Pt will  benefit from skilled PT to improve deficits and pain and to return to PLOF.   OBJECTIVE IMPAIRMENTS: decreased activity tolerance, decreased mobility, difficulty walking, decreased ROM, decreased strength, increased muscle spasms, impaired flexibility, and pain.   ACTIVITY LIMITATIONS: bending, standing, squatting, stairs, hygiene/grooming, and locomotion level  PARTICIPATION LIMITATIONS: meal prep, cleaning, laundry, driving, community activity, and yard work  PERSONAL FACTORS:  none   are also affecting patient's functional outcome.   REHAB POTENTIAL: Good  CLINICAL DECISION MAKING: Stable/uncomplicated  EVALUATION COMPLEXITY: Low   GOALS: Goals reviewed with patient? Yes  SHORT TERM GOALS: Target date: 10/14/2022   Pt to be independent with final HEP  Goal status:  MET  2.  Pt to demo ability for full ROM for LAQ in seated position, without pain in R hamstring.   Goal status:  MET    LONG TERM GOALS: Target  date: 11/25/2022   Pt to be independent with final HEP   Goal status: INITIAL  2.  Pt to demo full ROM for R hamstring without pain, to improve ability for IADLs.   Goal status: INITIAL  3.  Pt to demo strength of R knee flexion to be at least 4+/5 to improve ability for functional activities, stairs, and return to exercise.   Goal status: INITIAL  4.  Pt to demo ability for bend, squat, and hip hinge motion with optimal mechanics, and no pain in hamstring, to improve ability for IADLs and return to exercise.   Goal status: INITIAL  5.  Pt to report pain 0-1/10 in R hamstring with activity and walking program.   Goal status:  MET   PLAN:  PT FREQUENCY: 1-2x/week  PT DURATION: 8 weeks  PLANNED INTERVENTIONS: Therapeutic exercises, Therapeutic activity, Neuromuscular re-education, Patient/Family education, Self Care, Joint mobilization, Joint manipulation, Stair training, Orthotic/Fit training, DME instructions, Aquatic Therapy, Dry Needling, Electrical stimulation, Cryotherapy, Moist heat, Taping, Ultrasound, Ionotophoresis 4mg /ml Dexamethasone, Manual therapy,  Vasopneumatic device, Traction, Spinal manipulation, Spinal mobilization,Balance training, Gait training,   PLAN FOR NEXT SESSION:    Sedalia Muta, PT, DPT 1:26 PM  10/29/22

## 2022-11-05 ENCOUNTER — Encounter: Payer: Self-pay | Admitting: Physical Therapy

## 2022-11-05 ENCOUNTER — Ambulatory Visit: Payer: Medicare Other | Admitting: Physical Therapy

## 2022-11-05 DIAGNOSIS — M25551 Pain in right hip: Secondary | ICD-10-CM | POA: Diagnosis not present

## 2022-11-05 NOTE — Therapy (Signed)
OUTPATIENT PHYSICAL THERAPY LOWER EXTREMITY TREATMENT   Patient Name: Dana Dillon MRN: [id number] DOB:06/23/1955, 67 y.o., female Today's Date: 11/05/2022  END OF SESSION:  PT End of Session - 11/05/22 1310     Visit Number 6    Number of Visits 16    Date for PT Re-Evaluation 11/25/22    Authorization Type [employer] Medicare    PT Start Time 1303    PT Stop Time 1344    PT Time Calculation (min) 41 min    Activity Tolerance Patient tolerated treatment well    Behavior During Therapy WFL for tasks assessed/performed             Past Medical History:  Diagnosis Date   Hot flashes    Hypertension    LYMPHOPENIA 05/04/2007   saw oncology- later normalized. ? virus related   RHINITIS 12/03/2009   Past Surgical History:  Procedure Laterality Date   CARPAL TUNNEL RELEASE     right side. Dr. Garfield Cornea SECTION     x2   Patient Active Problem List   Diagnosis Date Noted   Osteopenia of right femoral neck 07/21/2021   Aortic atherosclerosis (HCC) 07/08/2021   Nuclear sclerotic cataract of both eyes 06/05/2021   Posterior vitreous detachment of right eye 06/05/2021   Loud snoring 05/12/2021   Nasal deviation 05/12/2021   Cystoid macular edema of right eye 03/04/2021   Exudative retinopathy, right eye 03/04/2021   Right epiretinal membrane 03/04/2021   Hyperlipidemia 07/10/2020   Anxiety 05/12/2018   Hypothyroidism 05/12/2018   Plantar fasciitis of left foot 05/14/2017   Caregiver burden 12/11/2016   Former smoker 12/28/2015   Hot flash, menopausal 06/29/2014   Allergic rhinitis 12/03/2009   Hypertension 11/13/2005     PCP: Tana Conch  REFERRING PROVIDER: [name]  REFERRING DIAG: R hip pain, Partial R  hamstring tear  THERAPY DIAG:  Pain in right hip  Rationale for Evaluation and Treatment: Rehabilitation  ONSET DATE: 09/03/22.   SUBJECTIVE:   SUBJECTIVE STATEMENT: Pt states leg is doing good. No pain.    Eval: August 22, Pt slipped at  home, she was kneeling, and knees separated, heard a pop, had initial pain. Pain was not awful, was able to go away that weekend. But in airport a few days later she turned and had significant pain. She thinks tear happened at [employer].  She was found to have partial Tear of R Hamstring/proximal.  Previous: sometimes has pain in hips when laying on her side.  Soreness at R HS insertion.  Feels she is 97 % better at this time, but still hurts when turning certain ways.  Exercise: walk/jog on t-mill for 30 min, and weight machines at home.  R shoulder- pain, would like to review weight mechaics.  Walking: no pain at this time, but has not been going very far.    PERTINENT HISTORY: HTN  PAIN:  Are you having pain? Yes: NPRS scale: 0 /10 Pain location: R Hamstring  Pain description: Tight, pull  Aggravating factors: Bending over, getting in/out of car,  Relieving factors: none stated    PRECAUTIONS: None  WEIGHT BEARING RESTRICTIONS: No  FALLS:  Has patient fallen in last 6 months? No  PLOF: Independent  PATIENT GOALS:  Decreased pain   NEXT MD VISIT:   OBJECTIVE:   DIAGNOSTIC FINDINGS:  US Impression:  Proximal hamstring tendon partial tearing  PATIENT SURVEYS: FOTO: initial:   71.9 Foto:  visit  5:  93.7  COGNITION: Overall cognitive status: Within functional limits for tasks assessed     SENSATION: WFL  EDEMA:   POSTURE:    No Significant postural limitations  PALPATION:     LOWER EXTREMITY ROM:   LOWER EXTREMITY MMT:  MMT Left eval Right  eval Right  11/05/22   Hip flexion 4 4- 4+  Hip extension     Hip abduction 4 4- 4+  Hip adduction     Hip internal rotation     Hip external rotation     Knee flexion 5 4- 4+  Knee extension 5 4 5   Ankle dorsiflexion     Ankle plantarflexion     Ankle inversion     Ankle eversion      (Blank rows = not tested)   FUNCTIONAL TESTS:    TODAY'S TREATMENT:                                                                                                                               DATE:   11/05/2022 Therapeutic Exercise: Aerobic:  Level 2 x 8 min;   Supine:   Prone:    Seated:   seated hip hinge motion x 10;  Standing:     Stretches:  trial for seated hamstring stretch 30 sec x 3, slight soreness at proximal hamstring - advised not to start stretching if its uncomfortable.  Neuromuscular Re-education: Manual Therapy: Therapeutic Activity: Self Care:   PATIENT EDUCATION:  Education details:updated and reviewed HEP  Person educated: Patient Education method: Explanation, Demonstration, Tactile cues, Verbal cues, and Handouts Education comprehension: verbalized understanding, returned demonstration, verbal cues required, tactile cues required, and needs further education   HOME EXERCISE PROGRAM: Access Code: [email or web address]  ASSESSMENT:  CLINICAL IMPRESSION: 11/05/2022  Pt had to leave early, got a call that husband was going to hospital. Pt has made good progress with hamstring, and has been pain free. She has been able to progress activity and strengthening without pain. Reviewed final HEP today.   OBJECTIVE IMPAIRMENTS: decreased activity tolerance, decreased mobility, difficulty walking, decreased ROM, decreased strength, increased muscle spasms, impaired flexibility, and pain.   ACTIVITY LIMITATIONS: bending, standing, squatting, stairs, hygiene/grooming, and locomotion level  PARTICIPATION LIMITATIONS: meal prep, cleaning, laundry, driving, community activity, and yard work  PERSONAL FACTORS:  none   are also affecting patient's functional outcome.   REHAB POTENTIAL: Good  CLINICAL DECISION MAKING: Stable/uncomplicated  EVALUATION COMPLEXITY: Low   GOALS: Goals reviewed with patient? Yes  SHORT TERM GOALS: Target date: 10/14/2022   Pt to be independent with final HEP  Goal status:  MET  2.  Pt to demo ability for full ROM for LAQ in seated position, without pain in  R hamstring.   Goal status:  MET    LONG TERM GOALS: Target date: 11/25/2022   Pt to be independent with final HEP   Goal status: MET  2.  Pt to demo full ROM for R hamstring  without pain, to improve ability for IADLs.   Goal status: MET  3.  Pt to demo strength of R knee flexion to be at least 4+/5 to improve ability for functional activities, stairs, and return to exercise.   Goal status: MET  4.  Pt to demo ability for bend, squat, and hip hinge motion with optimal mechanics, and no pain in hamstring, to improve ability for IADLs and return to exercise.   Goal status: MET  5.  Pt to report pain 0-1/10 in R hamstring with activity and walking program.   Goal status:  MET   PLAN:  PT FREQUENCY: 1-2x/week  PT DURATION: 8 weeks  PLANNED INTERVENTIONS: Therapeutic exercises, Therapeutic activity, Neuromuscular re-education, Patient/Family education, Self Care, Joint mobilization, Joint manipulation, Stair training, Orthotic/Fit training, DME instructions, Aquatic Therapy, Dry Needling, Electrical stimulation, Cryotherapy, Moist heat, Taping, Ultrasound, Ionotophoresis 4mg /ml Dexamethasone, Manual therapy,  Vasopneumatic device, Traction, Spinal manipulation, Spinal mobilization,Balance training, Gait training,   PLAN FOR NEXT SESSION:    Sedalia Muta, PT, DPT 1:10 PM  11/05/22   PHYSICAL THERAPY DISCHARGE SUMMARY  Visits from Start of Care: 6   Plan: Patient agrees to discharge.  Patient goals were  met. Patient is being discharged due to meeting the stated rehab goals.     Sedalia Muta, PT, DPT 1:15 PM  11/05/22

## 2022-11-06 ENCOUNTER — Ambulatory Visit: Payer: Medicare Other | Admitting: Podiatry

## 2022-11-16 ENCOUNTER — Telehealth: Payer: Self-pay | Admitting: Sports Medicine

## 2022-11-16 NOTE — Telephone Encounter (Signed)
Patient informed.  No new symptoms, she will contact us if anything changes.

## 2022-11-16 NOTE — Telephone Encounter (Signed)
Patient called stating that on Friday she noticed a very large bruise on her bicep. She said that she has had the normal pain and no known injury. She asked if she should have this checked out or what she should do?  Please advise.

## 2022-11-23 NOTE — Progress Notes (Deleted)
    [name] [name] [address] [place] [address] Phone: [phone]   Assessment and Plan:     There are no diagnoses linked to this encounter.  ***   Pertinent previous records reviewed include ***    Follow Up: ***     Subjective:   I, Lounette Sloan, am serving as a Neurosurgeon for Doctor Richardean Sale   Chief Complaint: hamstring pain    HPI:    09/08/2022 Patient is a 67 year old female complaining of hamstring pain. Patient states was packing and was kneeling on the floor and her legs started sliding in a splits motion and she heard a pop on the right hamstring. Patient took a flexeril that night. Patient was in [place] and had to use the wheels chair and she started to feel better, but when she was in the airport she turned to grab her suitcase and turned wrong and the pain is back. Using tylenol, flexeril, heating pad, rest. Just feels super tight and pulled    09/22/2022 Patient states that she has improved . Will get a twinge of pain    10/20/2022 Patient states she is definitely improving, shoulder flare here for a tune up , left foot pain they are working out in PT    11/24/2022 Patient states   Relevant Historical Information: Hypertension, hypothyroidism  Additional pertinent review of systems negative.   Current Outpatient Medications:    Cholecalciferol (VITAMIN D3 PO), Take 5,000 Units by mouth daily., Disp: , Rfl:    ciclopirox (PENLAC) 8 % solution, APPLY DAILY OVER NAIL AND SURROUNDING SKIN AND OVER PREVIOUS COAT. AFTER SEVEN (7) DAYS, MAY REMOVE WITH ALCOHOL AND CONTINUE CYCLE., Disp: 6.6 mL, Rfl: 0   cyclobenzaprine (FLEXERIL) 5 MG tablet, Take 1 tablet (5 mg total) by mouth at bedtime., Disp: 30 tablet, Rfl: 0   levothyroxine (SYNTHROID) 50 MCG tablet, TAKE 1 TABLET BY MOUTH IN THE  MORNING, Disp: 100 tablet, Rfl: 2   losartan-hydrochlorothiazide (HYZAAR) 100-25 MG tablet, TAKE 1 TABLET BY MOUTH DAILY,  Disp: 100 tablet, Rfl: 2   MAGNESIUM CITRATE PO, Take by mouth., Disp: , Rfl:    meloxicam (MOBIC) 15 MG tablet, Take 1 tablet (15 mg total) by mouth daily., Disp: 30 tablet, Rfl: 0   rosuvastatin (CRESTOR) 10 MG tablet, Take 1 tablet (10 mg total) by mouth 3 (three) times a week. Take 1 tablet three times a week, Disp: 70 tablet, Rfl: 1   Objective:     There were no vitals filed for this visit.    There is no height or weight on file to calculate BMI.    Physical Exam:    ***   Electronically signed by:  [name] [name] 7:55 AM 11/23/22

## 2022-11-24 ENCOUNTER — Ambulatory Visit: Payer: Medicare Other | Admitting: Sports Medicine

## 2022-11-26 ENCOUNTER — Ambulatory Visit: Payer: Medicare Other | Admitting: Podiatry

## 2022-11-26 DIAGNOSIS — L603 Nail dystrophy: Secondary | ICD-10-CM

## 2022-11-26 DIAGNOSIS — L84 Corns and callosities: Secondary | ICD-10-CM | POA: Diagnosis not present

## 2022-11-26 DIAGNOSIS — B351 Tinea unguium: Secondary | ICD-10-CM | POA: Diagnosis not present

## 2022-11-26 NOTE — Progress Notes (Signed)
  Subjective:  Patient ID: Dana Dillon, female    DOB: 07-15-1955,  MRN: [id number]  No chief complaint on file.   67 y.o. female presents for f/u L 5th toe callus and nail fungal infection. Using penlac.  Overall reports improvement in the left hallux nail.  Denies pain at the fifth toe.  Past Medical History:  Diagnosis Date   Hot flashes    Hypertension    LYMPHOPENIA 05/04/2007   saw oncology- later normalized. ? virus related   RHINITIS 12/03/2009    No Known Allergies  ROS: Negative except as per HPI above  Objective:  General: AAO x3, NAD  Dermatological: Callus present at the lateral aspect of the fifth toe nail base.  Adductovarus rotation of the toe is noted.  Decreased dystrophy and discoloration of the left hallux nail much improved over the 75% of the nail  Vascular:  Dorsalis Pedis artery and Posterior Tibial artery pedal pulses are 2/4 bilateral.  Capillary fill time < 3 sec to all digits.   Neruologic: Grossly intact via light touch bilateral. Protective threshold intact to all sites bilateral.   Musculoskeletal: No gross boney pedal deformities bilateral. No pain, crepitus, or limitation noted with foot and ankle range of motion bilateral. Muscular strength 5/5 in all groups tested bilateral.  Gait: Unassisted, Nonantalgic.   No images are attached to the encounter.   Assessment:   1. Nail dystrophy   2. Onychomycosis   3. Callus of foot       Plan:  Patient was evaluated and treated and all questions answered.  # Callus of the left fifth toe All symptomatic hyperkeratoses x1 were safely debrided with a sterile #15 blade to patient's level of comfort without incident. We discussed preventative and palliative care of these lesions including supportive and accommodative shoegear, padding, prefabricated and custom molded accommodative orthoses, use of a pumice stone and lotions/creams daily. -Overall not causing pain could consider fifth toe  derotational arthroplasty if it were to become a problem for her  # Onychomycosis Onychomycosis -Educated on etiology of nail fungus. -Nail sample taken for microbiology and histology. -Discussed oral and topical antifungal treatment -Continue Penlac 8% topical solution applied daily to the left hallux nail for the next 90 days.          Corinna Gab, DPM [facility] / [facility]

## 2022-12-02 ENCOUNTER — Other Ambulatory Visit: Payer: Self-pay | Admitting: Family Medicine

## 2022-12-24 ENCOUNTER — Other Ambulatory Visit: Payer: Self-pay | Admitting: Pharmacist

## 2022-12-24 ENCOUNTER — Encounter: Payer: Self-pay | Admitting: Pharmacist

## 2022-12-24 NOTE — Progress Notes (Signed)
Pharmacy Quality Measure Review  This patient is appearing on a report for being at risk of failing the adherence measure for cholesterol (statin) medications this calendar year.   Medication: rosuvastatin - take 1 tablet 3 times per week Last fill date: 09/24/2022 for #70 tablets - per pharmacy claim this was a 90 DS but if patient is taking only 3 times per week, then #70 would last about 120 days.   #40 would be closer to a 90 days supply.  Patient will need update Rx for her next refill.     Left voicemail for patient to return my call at their convenience. and will facilitate updated Rx but looks like patient might be due to see PCP.

## 2022-12-24 NOTE — Telephone Encounter (Signed)
Opened in error

## 2022-12-30 ENCOUNTER — Telehealth: Payer: Self-pay | Admitting: Family Medicine

## 2022-12-30 NOTE — Telephone Encounter (Signed)
Unable to LVM, mailbox is full. Needs a follow up w/[facility]

## 2023-02-15 ENCOUNTER — Encounter: Payer: Self-pay | Admitting: Family Medicine

## 2023-02-15 ENCOUNTER — Ambulatory Visit: Payer: Medicare Other | Admitting: Family Medicine

## 2023-02-15 VITALS — BP 120/82 | HR 68 | Temp 97.6°F | Resp 18 | Ht 62.0 in | Wt 165.5 lb

## 2023-02-15 DIAGNOSIS — G8929 Other chronic pain: Secondary | ICD-10-CM

## 2023-02-15 DIAGNOSIS — M5416 Radiculopathy, lumbar region: Secondary | ICD-10-CM | POA: Diagnosis not present

## 2023-02-15 DIAGNOSIS — M25572 Pain in left ankle and joints of left foot: Secondary | ICD-10-CM | POA: Diagnosis not present

## 2023-02-15 DIAGNOSIS — M25552 Pain in left hip: Secondary | ICD-10-CM | POA: Diagnosis not present

## 2023-02-15 DIAGNOSIS — M791 Myalgia, unspecified site: Secondary | ICD-10-CM

## 2023-02-15 DIAGNOSIS — M25551 Pain in right hip: Secondary | ICD-10-CM | POA: Diagnosis not present

## 2023-02-15 LAB — MAGNESIUM: Magnesium: 2 mg/dL (ref 1.5–2.5)

## 2023-02-15 LAB — COMPREHENSIVE METABOLIC PANEL
ALT: 27 U/L (ref 0–35)
AST: 26 U/L (ref 0–37)
Albumin: 4.3 g/dL (ref 3.5–5.2)
Alkaline Phosphatase: 48 U/L (ref 39–117)
BUN: 22 mg/dL (ref 6–23)
CO2: 24 meq/L (ref 19–32)
Calcium: 9.2 mg/dL (ref 8.4–10.5)
Chloride: 99 meq/L (ref 96–112)
Creatinine, Ser: 0.71 mg/dL (ref 0.40–1.20)
GFR: 87.31 mL/min (ref >60.00)
Glucose, Bld: 99 mg/dL (ref 70–99)
Potassium: 3.3 meq/L — ABNORMAL LOW (ref 3.5–5.1)
Sodium: 136 meq/L (ref 135–145)
Total Bilirubin: 0.5 mg/dL (ref 0.2–1.2)
Total Protein: 7.1 g/dL (ref 6.0–8.3)

## 2023-02-15 LAB — CBC WITH DIFFERENTIAL/PLATELET
Basophils Absolute: 0 10*3/uL (ref 0.0–0.1)
Basophils Relative: 0.7 % (ref 0.0–3.0)
Eosinophils Absolute: 0.2 10*3/uL (ref 0.0–0.7)
Eosinophils Relative: 2.4 % (ref 0.0–5.0)
HCT: 39 % (ref 36.0–46.0)
Hemoglobin: 12.9 g/dL (ref 12.0–15.0)
Lymphocytes Relative: 39.7 % (ref 12.0–46.0)
Lymphs Abs: 2.5 10*3/uL (ref 0.7–4.0)
MCHC: 33 g/dL (ref 30.0–36.0)
MCV: 89.8 fL (ref 78.0–100.0)
Monocytes Absolute: 0.6 10*3/uL (ref 0.1–1.0)
Monocytes Relative: 9.7 % (ref 3.0–12.0)
Neutro Abs: 3 10*3/uL (ref 1.4–7.7)
Neutrophils Relative %: 47.5 % (ref 43.0–77.0)
Platelets: 181 10*3/uL (ref 150.0–400.0)
RBC: 4.34 Mil/uL (ref 3.87–5.11)
RDW: 13.3 % (ref 11.5–15.5)
WBC: 6.3 10*3/uL (ref 4.0–10.5)

## 2023-02-15 LAB — TSH: TSH: 3.69 u[IU]/mL (ref 0.35–5.50)

## 2023-02-15 LAB — SEDIMENTATION RATE: Sed Rate: 33 mm/h — ABNORMAL HIGH (ref 0–30)

## 2023-02-15 LAB — CK: Total CK: 36 U/L (ref 7–177)

## 2023-02-15 MED ORDER — CYCLOBENZAPRINE HCL 5 MG PO TABS: 5.0000 mg | ORAL_TABLET | Freq: Every day | ORAL | 0 refills | Status: AC

## 2023-02-15 NOTE — Patient Instructions (Addendum)
Stop crestor for 2 wks and let us know  Continue stretches. Refer PT  Can try tumeric for pain

## 2023-02-15 NOTE — Progress Notes (Signed)
Subjective:     Patient ID: Dana Dillon, female    DOB: [birth year], 68 y.o.   MRN: [id number]  Chief Complaint  Patient presents with   Alfonse Ras String Issue    Tore ham string in August, has seen therapy, still having body pain Need refill of muscle relaxer    HPI Discussed the use of AI scribe software for clinical note transcription with the patient, who gave verbal consent to proceed.  History of Present Illness   The patient presents with persistent left ankle pain following a right hip injury.  The left ankle pain began around October 8th and is described as feeling like walking on the bone, with significant pain. It was initially attributed to walking incorrectly, leading to a strained calf muscle. Despite therapy, the pain has worsened, causing limping and difficulty walking even short distances. No tenderness is noted when pushing on the ankle, but pain occurs when walking.  The right hip injury occurred in the third week of August while packing a suitcase, resulting in a tear at the top of the right hip where it meets the glute. Therapy with Lauren and sports medicine resolved the hip issue, but subsequent ankle pain developed.  Additional symptoms include achiness in the back and groin, particularly when sleeping on either side, leading to hip pain. There is weakness in the legs, especially when climbing stairs, and tingling down both legs, associated with longstanding lower back issues. Leg stretches have provided some relief, particularly for the ankle pain.  They have a history of taking meloxicam, Tylenol, and Motrin for pain management. A purple mark on the left arm, suspected to be related to medication use, led to a reduction in medication intake and the use of milk thistle, which they believe has helped.  They take Rovastatin three times a week due to severe charley horses when taken four times a week. Concerns about the medication's side effects, particularly in relation  to current symptoms, are expressed. They have been functioning well on the three-day regimen but are unsure if it is affecting recovery from the current episode.       Health Maintenance Due  Topic Date Due   Medicare Annual Wellness (AWV)  Never done    Past Medical History:  Diagnosis Date   Hot flashes    Hypertension    LYMPHOPENIA 05/04/2007   saw oncology- later normalized. ? virus related   RHINITIS 12/03/2009    Past Surgical History:  Procedure Laterality Date   CARPAL TUNNEL RELEASE     right side. Dr. Amanda Pea   CESAREAN SECTION     x2     Current Outpatient Medications:    Cholecalciferol (VITAMIN D3 PO), Take 5,000 Units by mouth daily., Disp: , Rfl:    levothyroxine (SYNTHROID) 50 MCG tablet, TAKE 1 TABLET BY MOUTH IN THE  MORNING, Disp: 100 tablet, Rfl: 2   losartan-hydrochlorothiazide (HYZAAR) 100-25 MG tablet, TAKE 1 TABLET BY MOUTH DAILY, Disp: 100 tablet, Rfl: 2   MAGNESIUM CITRATE PO, Take by mouth., Disp: , Rfl:    rosuvastatin (CRESTOR) 10 MG tablet, Take 1 tablet (10 mg total) by mouth 3 (three) times a week. Take 1 tablet three times a week, Disp: 70 tablet, Rfl: 1   ciclopirox (PENLAC) 8 % solution, APPLY DAILY OVER NAIL AND SURROUNDING SKIN AND OVER PREVIOUS COAT. AFTER SEVEN (7) DAYS, MAY REMOVE WITH ALCOHOL AND CONTINUE CYCLE. (Patient not taking: Reported on 02/15/2023), Disp: 6.6 mL, Rfl: 0  cyclobenzaprine (FLEXERIL) 5 MG tablet, Take 1 tablet (5 mg total) by mouth at bedtime. (Patient not taking: Reported on 02/15/2023), Disp: 30 tablet, Rfl: 0   meloxicam (MOBIC) 15 MG tablet, Take 1 tablet (15 mg total) by mouth daily. (Patient not taking: Reported on 02/15/2023), Disp: 30 tablet, Rfl: 0  No Known Allergies ROS neg/noncontributory except as noted HPI/below      Objective:     BP 120/82   Pulse 68   Temp 97.6 F (36.4 C) (Temporal)   Resp 18   Ht 5\' 2"  (1.575 m)   Wt 165 lb 8 oz (75.1 kg)   SpO2 99%   BMI 30.27 kg/m  Wt Readings from  Last 3 Encounters:  02/15/23 165 lb 8 oz (75.1 kg)  10/20/22 162 lb (73.5 kg)  09/22/22 160 lb (72.6 kg)    Physical Exam   Gen: WDWN NAD HEENT: NCAT, conjunctiva not injected, sclera nonicteric EXT:  no edema MSK: no gross abnormalities.  NEURO: A&O x3.  CN II-XII intact.  PSYCH: normal mood. Good eye contact Back:  can stand on heels/toes/1 leg.  No TTP. No SI tenderness. No rash.  MS 5/5 BLE.  DTR 2+ BLE.  SLR neg B.  Good ROM  No ttp L medial maleolus      Assessment & Plan:  Myalgia -     CBC with Differential/Platelet -     Comprehensive metabolic panel -     TSH -     Sedimentation rate -     CK -     Magnesium -     Rheumatoid factor -     Ambulatory referral to Physical Therapy  Bilateral hip pain -     CBC with Differential/Platelet -     Comprehensive metabolic panel -     TSH -     Sedimentation rate -     CK -     Magnesium -     Rheumatoid factor -     Ambulatory referral to Physical Therapy  Lumbar radiculopathy -     Ambulatory referral to Physical Therapy  Chronic pain of left ankle -     Ambulatory referral to Physical Therapy  Assessment and Plan    Left Ankle Pain Chronic left ankle pain since October is exacerbated by walking and prolonged standing, with pain localized to the medial aspect severe enough to cause limping. Previous therapy for presumed calf muscle strain was ineffective. The differential diagnosis includes musculoskeletal strain, tendonitis, or referred pain from the back. They prefer to start with physical therapy and stretching exercises before considering further imaging. An x-ray of the left foot and ankle is ordered. They will be referred to physical therapy and continue stretching exercises.  Chronic Lower Back Pain Chronic lower back pain with associated groin and bilateral hip pain is exacerbated by side sleeping and prolonged activity. Symptoms suggest a possible musculoskeletal or neuropathic origin, with a differential  diagnosis including lumbar spine pathology, arthritis, or polymyalgia rheumatica. Reports of tingling in both legs indicate possible nerve involvement. They prefer to start with physical therapy and stretching exercises before considering further imaging. An x-ray of the lower spine is ordered. They will continue stretching exercises, including quads and hamstrings, and be referred to physical therapy.  Medication-Induced Purpura Development of purpura on the left arm is likely secondary to multiple medications, including meloxicam, acetaminophen, and rosuvastatin. Symptoms improved after discontinuing medications, with possible liver involvement due to medication use. Significant bruising on  the left arm and a smaller bruise after resuming ibuprofen indicate a potential link to medication use. They prefer to discontinue rosuvastatin and monitor for improvement. Rosuvastatin will be discontinued for two weeks. Blood work is ordered to check liver function, kidney function, ESR, and muscle enzymes. Symptoms will be monitored for improvement.  General Health Maintenance Discussion focused on the impact of medications on overall health and the importance of monitoring liver function. Emphasis was placed on the need for physical activity and proper stretching to maintain musculoskeletal health. They report using milk thistle for liver support with some improvement in symptoms. They will continue milk thistle for liver support and are encouraged to engage in regular physical activity within pain limits. A follow-up is scheduled to review blood work results and reassess symptoms.  Follow-up A follow-up in two weeks is scheduled to assess the impact of discontinuing rosuvastatin and to review blood work results.      Myalgias-?from statin.  Hold for 2 wks and see if improvement in symptoms.  No x-rays ordered as pt wants to try no statin and PT first.  Had some imaging in August.    Return if symptoms worsen  or fail to improve.  Angelena Sole, MD

## 2023-02-15 NOTE — Progress Notes (Signed)
Lab ok except sed rate very slightly elevated(don't think a problem) Potassium a little low-suggest starting rx kcl daily #30.  Needs to sch f/u w/Dr. Gaylyn Lambert need repeat labs and f/u meds

## 2023-02-16 LAB — RHEUMATOID FACTOR: Rheumatoid fact SerPl-aCnc: <10 [IU]/mL (ref <14)

## 2023-02-17 ENCOUNTER — Other Ambulatory Visit: Payer: Self-pay | Admitting: *Deleted

## 2023-02-17 MED ORDER — POTASSIUM CHLORIDE CRYS ER 20 MEQ PO TBCR: 20.0000 meq | EXTENDED_RELEASE_TABLET | Freq: Every day | ORAL | 0 refills | Status: DC

## 2023-02-22 ENCOUNTER — Ambulatory Visit (INDEPENDENT_AMBULATORY_CARE_PROVIDER_SITE_OTHER): Payer: Medicare Other | Admitting: Family Medicine

## 2023-02-22 ENCOUNTER — Encounter: Payer: Self-pay | Admitting: Family Medicine

## 2023-02-22 VITALS — BP 114/68 | HR 90 | Temp 97.0°F | Ht 62.0 in | Wt 168.2 lb

## 2023-02-22 DIAGNOSIS — E039 Hypothyroidism, unspecified: Secondary | ICD-10-CM | POA: Diagnosis not present

## 2023-02-22 DIAGNOSIS — I10 Essential (primary) hypertension: Secondary | ICD-10-CM

## 2023-02-22 DIAGNOSIS — E785 Hyperlipidemia, unspecified: Secondary | ICD-10-CM

## 2023-02-22 MED ORDER — LOSARTAN POTASSIUM-HCTZ 100-12.5 MG PO TABS: 1.0000 | ORAL_TABLET | Freq: Every day | ORAL | 3 refills | Status: DC

## 2023-02-22 NOTE — Progress Notes (Signed)
 Phone [phone] In person visit   Subjective:   Dana Dillon is a 68 y.o. year old very pleasant female patient who presents for/with See problem oriented charting Chief Complaint  Patient presents with   Follow-up    Lavonia Powers on left outside of forearm and questions about recent labs     Past Medical History-  Patient Active Problem List   Diagnosis Date Noted   Hyperlipidemia 07/10/2020    Priority: High   Osteopenia of right femoral neck 07/21/2021    Priority: Medium    Aortic atherosclerosis (HCC) 07/08/2021    Priority: Medium    Anxiety 05/12/2018    Priority: Medium    Hypothyroidism 05/12/2018    Priority: Medium    Hypertension 11/13/2005    Priority: Medium    Nuclear sclerotic cataract of both eyes 06/05/2021    Priority: Low   Posterior vitreous detachment of right eye 06/05/2021    Priority: Low   Loud snoring 05/12/2021    Priority: Low   Nasal deviation 05/12/2021    Priority: Low   Cystoid macular edema of right eye 03/04/2021    Priority: Low   Exudative retinopathy, right eye 03/04/2021    Priority: Low   Right epiretinal membrane 03/04/2021    Priority: Low   Plantar fasciitis of left foot 05/14/2017    Priority: Low   Caregiver burden 12/11/2016    Priority: Low   Former smoker 12/28/2015    Priority: Low   Hot flash, menopausal 06/29/2014    Priority: Low   Allergic rhinitis 12/03/2009    Priority: Low    Medications- reviewed and updated Current Outpatient Medications  Medication Sig Dispense Refill   Cholecalciferol (VITAMIN D3 PO) Take 5,000 Units by mouth daily.     cyclobenzaprine  (FLEXERIL ) 5 MG tablet Take 1 tablet (5 mg total) by mouth at bedtime. 30 tablet 0   levothyroxine  (SYNTHROID ) 50 MCG tablet TAKE 1 TABLET BY MOUTH IN THE  MORNING 100 tablet 2   losartan -hydrochlorothiazide  (HYZAAR) 100-12.5 MG tablet Take 1 tablet by mouth daily. 90 tablet 3   MAGNESIUM CITRATE PO Take by mouth.     potassium chloride  SA (KLOR-CON   M) 20 MEQ tablet Take 1 tablet (20 mEq total) by mouth daily. 30 tablet 0   rosuvastatin  (CRESTOR ) 10 MG tablet Take 1 tablet (10 mg total) by mouth 3 (three) times a week. Take 1 tablet three times a week 70 tablet 1   No current facility-administered medications for this visit.     Objective:  BP 114/68 (BP Location: Left Arm, Patient Position: Sitting, Cuff Size: Normal)   Pulse 90   Temp (!) 97 F (36.1 C) (Temporal)   Ht 5\' 2"  (1.575 m)   Wt 168 lb 3.2 oz (76.3 kg)   SpO2 97%   BMI 30.76 kg/m  Gen: NAD, resting comfortably CV: RRR no murmurs rubs or gallops Lungs: CTAB no crackles, wheeze, rhonchi Abdomen: soft/nontender/nondistended/normal bowel sounds. No rebound or guarding.  Ext: no edema, 1 x 0.5 cm bruise on left forearm Skin: warm, dry     Assessment and Plan   # Skin changes on forearm S: Patient notes marked on her left lateral forearm- bruising it appears- has been more extensive-today much lower amount  -Previously it was thought at visit 7 days ago to be related to multiple medications including meloxicam  has improved off of the medicine.  ESR was drawn and very mildly elevated at 33 A/P: could simply be easy bruising-  CBC reassuring. I doubt vasculitis but we should keep an eye on this over coming months.  I think the meloxicam  was likely contributing   #hypertension S: medication: losartan  Hydrochlorothiazide  100-25 mg daily. Some achiness with low potassium -Potassium mildly low at 3.3 on last check- started potassium today -doesn't check blood pressure much at home BP Readings from Last 3 Encounters:  02/22/23 114/68  02/15/23 120/82  09/22/22 122/82   A/P: blood pressure is very well controlled today- in fact with reent low potassium we opted to trial losartan -hydrochlorothiazide  100-12.5 mg to decrease liklihood that hydrochlorothiazide  is causing low potassium - she can do 5 days of potassium and hold the other pills -recheck in 1-2 months blood  pressure and potassium -Another option instead of seeing me would be doing wellness visit in person so Brian Campanile can check pressure and you could schedule labs the same day and I could reach out to you about results. I ordered lipid panel and CMP today   #hyperlipidemia-in 2023 79th percentile CT cardiac scoringat 94.7 S: Medication:rosuvastatin  3 days a week working ok-did not tolerate higher dose- tried off for the last week to see if it would help with generalized achiness. Starts physical therapy next week Lab Results  Component Value Date   CHOL 222 (H) 05/20/2021   HDL 65.60 05/20/2021   LDLCALC 140 (H) 05/20/2021   LDLDIRECT 115.0 11/18/2021   TRIG 82.0 05/20/2021   CHOLHDL 3 05/20/2021    A/P: lipids above goal and would love for her to be on medicine but short term over next month we opted to hold off on rosuvastatin  to see if achiness will stabilize and if physical therapy can help.  -recheck lipids next visiit- consider alternate statin.  -We also discussed possible referral to cardiology and lipid clinic   #hypothyroidism S: compliant On thyroid  medication-levothyroxine  50 mcg daily  Lab Results  Component Value Date   TSH 3.69 02/15/2023  A/P:hopefully stable- update tsh today. Continue current meds for now     Recommended follow up: Return in about 1 month (around 03/22/2023) for followup or sooner if needed.Schedule b4 you leave. Or up to 2 months Future Appointments  Date Time Provider Department Center  03/02/2023  8:00 AM Terrilee Few, PT [facility] None    Lab/Order associations: No diagnosis found.  Meds ordered this encounter  Medications   losartan -hydrochlorothiazide  (HYZAAR) 100-12.5 MG tablet    Sig: Take 1 tablet by mouth daily.    Dispense:  90 tablet    Refill:  3    Return precautions advised.  Clarisa Crooked, MD

## 2023-02-22 NOTE — Patient Instructions (Addendum)
 blood pressure is very well controlled today- in fact with recent low potassium we opted to trial losartan -hydrochlorothiazide  100-12.5 mg to decrease liklihood that hydrochlorothiazide  is causing low potassium - she can do 5 days of potassium and hold the other pills -recheck in 1-2 months blood pressure and potassium   lipids above goal and would love for her to be on medicine but short term over next month we opted to hold off on rosuvastatin  to see if achiness will stabilize and if physical therapy can help.   Recommended follow up: Return in about 1 month (around 03/22/2023) for followup or sooner if needed.Schedule b4 you leave. Another option instead of seeing me would be doing wellness visit in person so Brian Campanile can check pressure and you could schedule labs the same day and I could reach out to you about results

## 2023-03-02 ENCOUNTER — Ambulatory Visit: Payer: Medicare Other | Admitting: Physical Therapy

## 2023-03-02 DIAGNOSIS — M25572 Pain in left ankle and joints of left foot: Secondary | ICD-10-CM

## 2023-03-02 DIAGNOSIS — M25552 Pain in left hip: Secondary | ICD-10-CM

## 2023-03-02 DIAGNOSIS — M25551 Pain in right hip: Secondary | ICD-10-CM

## 2023-03-02 DIAGNOSIS — M5459 Other low back pain: Secondary | ICD-10-CM | POA: Diagnosis not present

## 2023-03-02 NOTE — Therapy (Unsigned)
 OUTPATIENT PHYSICAL THERAPY LOWER EXTREMITY EVALUATION   Patient Name: Dana Dillon MRN: [id number] DOB:12/26/1955, 68 y.o., female Today's Date: 03/02/2023  END OF SESSION:   Past Medical History:  Diagnosis Date   Hot flashes    Hypertension    LYMPHOPENIA 05/04/2007   saw oncology- later normalized. ? virus related   RHINITIS 12/03/2009   Past Surgical History:  Procedure Laterality Date   CARPAL TUNNEL RELEASE     right side. Dr. Garfield Cornea SECTION     x2   Patient Active Problem List   Diagnosis Date Noted   Osteopenia of right femoral neck 07/21/2021   Aortic atherosclerosis (HCC) 07/08/2021   Nuclear sclerotic cataract of both eyes 06/05/2021   Posterior vitreous detachment of right eye 06/05/2021   Loud snoring 05/12/2021   Nasal deviation 05/12/2021   Cystoid macular edema of right eye 03/04/2021   Exudative retinopathy, right eye 03/04/2021   Right epiretinal membrane 03/04/2021   Hyperlipidemia 07/10/2020   Anxiety 05/12/2018   Hypothyroidism 05/12/2018   Plantar fasciitis of left foot 05/14/2017   Caregiver burden 12/11/2016   Former smoker 12/28/2015   Hot flash, menopausal 06/29/2014   Allergic rhinitis 12/03/2009   Hypertension 11/13/2005       PCP: ***  REFERRING PROVIDER: ***  REFERRING DIAG: ***  THERAPY DIAG:  No diagnosis found.  Rationale for Evaluation and Treatment: {HABREHAB:27488}  ONSET DATE: ***    SUBJECTIVE:   SUBJECTIVE STATEMENT:  L  Ankle pain started feeling pain at medial high ankle, worsening pain for a couple months.   Back pain low back pain into bil hips, into anterior hips, worse on L side. Worse with sleeping/ having trouble sleeping on her sides. Ususally sleeps on back-  Pain with initial standing- shooting into bil hips.   Has stopped taking statin,  and took potassium, feeling a bit better, was feeling very achey all over.    Shoulder doing well.   Previous R side HS strain, now feeling  fine with that.   PERTINENT HISTORY: ***   PAIN:  Are you having pain? Yes: NPRS scale: up to 8/10 with sleeping  Pain location: bil hips Pain description: *** Aggravating factors: sleeping on her sides  Relieving factors: ***  Are you having pain? Yes: NPRS scale: up to 10/10 Pain location: ankle Pain description: pain, constant Aggravating factors: *** Relieving factors: stretching her back  Are you having pain? Yes: NPRS scale: 3/10 Pain location: back Pain description: dull ache on and off, tight  Aggravating factors: muscle relaxer Relieving factors: ***   PRECAUTIONS: None  WEIGHT BEARING RESTRICTIONS: No  FALLS:  Has patient fallen in last 6 months? No   PLOF: Independent  PATIENT GOALS:  Decreased pain   NEXT MD VISIT:   OBJECTIVE:   DIAGNOSTIC FINDINGS:   PATIENT SURVEYS:    COGNITION: Overall cognitive status: Within functional limits for tasks assessed     SENSATION: WFL  EDEMA:   POSTURE:    No Significant postural limitations  PALPATION:   LOWER EXTREMITY ROM:  Active /Passive ROM Left eval Right eval  Hip flexion    Hip extension    Hip abduction    Hip adduction    Hip internal rotation    Hip external rotation    Knee flexion    Knee extension    Ankle dorsiflexion    Ankle plantarflexion    Ankle inversion    Ankle eversion     (Blank rows =  not tested)  LOWER EXTREMITY MMT:  MMT Left eval Right  eval  Hip flexion    Hip extension    Hip abduction    Hip adduction    Hip internal rotation    Hip external rotation    Knee flexion    Knee extension    Ankle dorsiflexion    Ankle plantarflexion    Ankle inversion    Ankle eversion     (Blank rows = not tested)  LOWER EXTREMITY SPECIAL TESTS:  {LEspecialtests:26242}   FUNCTIONAL TESTS:  {Functional tests:24029}   GAIT: Distance walked: *** Assistive device utilized: {Assistive devices:23999} Level of assistance: {Levels of  assistance:24026} Comments: ***   TODAY'S TREATMENT:                                                                                                                              DATE:     PATIENT EDUCATION:  Education details: PT POC, Exam findings, HEP Person educated: Patient Education method: Explanation, Demonstration, Tactile cues, Verbal cues, and Handouts Education comprehension: verbalized understanding, returned demonstration, verbal cues required, tactile cues required, and needs further education   HOME EXERCISE PROGRAM: Access Code: [email or web address] URL: [email or web address] Date: 03/02/2023 Prepared by: Sedalia Muta  Exercises - Hooklying Single Knee to Chest Stretch  - 2 x daily - 3 reps - 30 hold - Supine Piriformis Stretch Pulling Heel to Hip  - 2 x daily - 3 reps - 30 hold - Supine Lower Trunk Rotation  - 2 x daily - 10 reps - 5 hold - Prone Quadriceps Stretch with Strap  - 1 x daily - 4 reps - 20 hold - Supine Bridge  - 1 x daily - 1-2 sets - 10 reps  ASSESSMENT:  CLINICAL IMPRESSION: Patient presents with primary complaint of  ***   Pt with decreased ability for full functional activities. Pt will  benefit from skilled PT to improve deficits and pain and to return to PLOF.   OBJECTIVE IMPAIRMENTS: {opptimpairments:25111}.   ACTIVITY LIMITATIONS: {activitylimitations:27494}  PARTICIPATION LIMITATIONS: {participationrestrictions:25113}  PERSONAL FACTORS: {Personal factors:25162} are also affecting patient's functional outcome.   REHAB POTENTIAL: {rehabpotential:25112}  CLINICAL DECISION MAKING: {clinical decision making:25114}  EVALUATION COMPLEXITY: {Evaluation complexity:25115}   GOALS: Goals reviewed with patient? Yes  SHORT TERM GOALS: Target date: 03/16/2023    ***  Goal status: INITIAL  2.  ***  Goal status: INITIAL  3.  ***  Goal status: INITIAL    LONG TERM GOALS: Target date:  04/27/2023     ***  Goal  status: INITIAL  2.  ***  Goal status: INITIAL  3.  ***  Goal status: INITIAL  4.  *** Baseline:  Goal status: INITIAL  5.  ***  Goal status: INITIAL     PLAN:  PT FREQUENCY: {rehab frequency:25116}  PT DURATION: {rehab duration:25117}  PLANNED INTERVENTIONS: Therapeutic exercises, Therapeutic activity, Neuromuscular re-education, Patient/Family education, Self Care, Joint mobilization, Joint manipulation, Stair training, Orthotic/Fit  training, DME instructions, Aquatic Therapy, Dry Needling, Electrical stimulation, Cryotherapy, Moist heat, Taping, Ultrasound, Ionotophoresis 4mg /ml Dexamethasone, Manual therapy,  Vasopneumatic device, Traction, Spinal manipulation, Spinal mobilization,Balance training, Gait training,   PLAN FOR NEXT SESSION: bike, hip strength, back and hip mobility, L ankle strength , foot position.    Sedalia Muta, PT 06/23/2022, 9:15 AM

## 2023-03-03 ENCOUNTER — Encounter: Payer: Self-pay | Admitting: Physical Therapy

## 2023-03-15 ENCOUNTER — Ambulatory Visit: Payer: Medicare Other | Admitting: Physical Therapy

## 2023-03-15 ENCOUNTER — Encounter: Payer: Self-pay | Admitting: Physical Therapy

## 2023-03-15 DIAGNOSIS — M25552 Pain in left hip: Secondary | ICD-10-CM

## 2023-03-15 DIAGNOSIS — M25551 Pain in right hip: Secondary | ICD-10-CM | POA: Diagnosis not present

## 2023-03-15 DIAGNOSIS — M5459 Other low back pain: Secondary | ICD-10-CM

## 2023-03-15 DIAGNOSIS — M25572 Pain in left ankle and joints of left foot: Secondary | ICD-10-CM | POA: Diagnosis not present

## 2023-03-15 NOTE — Therapy (Signed)
 OUTPATIENT PHYSICAL THERAPY LOWER EXTREMITY TREATMENT   Patient Name: Dana Dillon MRN: [id number] DOB:[birth year], 68 y.o., female Today's Date: 03/15/2023   END OF SESSION:  PT End of Session - 03/15/23 1008     Visit Number 2    Number of Visits 16    Date for PT Re-Evaluation 04/27/23    Authorization Type [employer] medicare    PT Start Time 1012    PT Stop Time 1052    PT Time Calculation (min) 40 min    Activity Tolerance Patient tolerated treatment well    Behavior During Therapy WFL for tasks assessed/performed             Past Medical History:  Diagnosis Date   Hot flashes    Hypertension    LYMPHOPENIA 05/04/2007   saw oncology- later normalized. ? virus related   RHINITIS 12/03/2009   Past Surgical History:  Procedure Laterality Date   CARPAL TUNNEL RELEASE     right side. Dr. Garfield Cornea SECTION     x2   Patient Active Problem List   Diagnosis Date Noted   Osteopenia of right femoral neck 07/21/2021   Aortic atherosclerosis (HCC) 07/08/2021   Nuclear sclerotic cataract of both eyes 06/05/2021   Posterior vitreous detachment of right eye 06/05/2021   Loud snoring 05/12/2021   Nasal deviation 05/12/2021   Cystoid macular edema of right eye 03/04/2021   Exudative retinopathy, right eye 03/04/2021   Right epiretinal membrane 03/04/2021   Hyperlipidemia 07/10/2020   Anxiety 05/12/2018   Hypothyroidism 05/12/2018   Plantar fasciitis of left foot 05/14/2017   Caregiver burden 12/11/2016   Former smoker 12/28/2015   Hot flash, menopausal 06/29/2014   Allergic rhinitis 12/03/2009   Hypertension 11/13/2005    PCP: Tana Conch  REFERRING PROVIDER: Aris [place]  REFERRING DIAG: myalgia, back pain, bil hip pain, L ankle pain   THERAPY DIAG:  Other low back pain  Pain of both hip joints  Pain in left ankle and joints of left foot  Rationale for Evaluation and Treatment: Rehabilitation  ONSET DATE:    SUBJECTIVE:   SUBJECTIVE  STATEMENT: Pt states feeling significantly better in hips, back, legs, and ankle as well, has been doing HEP. Ankle still a bit sore.   Eval: Pt states pain in multiple areas. Reports increased L  Ankle pain  a couple months ago, after she was having pain in R hamstring. Leg pain better now, but ankle still painful/worsening. Pain started at medial high ankle, continued pain below medial malleolus.  States Back pain low back pain into bil hips, into anterior hips, worse on L side. Worse with sleeping/ having trouble sleeping on her sides. Pain with initial standing- shooting into bil hips.  Has stopped taking statin,  and took potassium, feeling a bit better, was feeling very achey all over.   Shoulder doing well, did have previous pain there.  Previous R side HS strain, now feeling fine with that.   PERTINENT HISTORY: none   PAIN:  Are you having pain? Yes: NPRS scale: up to 0-2/10 with sleeping  Pain location: bil hips Pain description: sore Aggravating factors: sleeping on her sides  Relieving factors: none   Are you having pain? Yes: NPRS scale: 4/10 Pain location: ankle Pain description: pain, constant Aggravating factors: weight bearing  Relieving factors: stretching her back  Are you having pain? Yes: NPRS scale: 0-2/10 Pain location: back Pain description: dull ache on and off, tight  Aggravating factors:  muscle relaxer Relieving factors: none stated    PRECAUTIONS: None  WEIGHT BEARING RESTRICTIONS: No  FALLS:  Has patient fallen in last 6 months? No   PLOF: Independent  PATIENT GOALS:  Decreased pain in foot, hips, back   NEXT MD VISIT:   OBJECTIVE:   DIAGNOSTIC FINDINGS:   PATIENT SURVEYS:   COGNITION: Overall cognitive status: Within functional limits for tasks assessed     SENSATION: WFL  EDEMA:   POSTURE:    foot: with shoes on: overcorrection into supination  PALPATION: Tenderness at bil gr troch, minimal soreness in low back/SI with  palpation;  Minimal/no tenderness at medial ankle with palpation.   LOWER EXTREMITY ROM: Hips: WFL Back: WFL Ankle: wfl, mild limitation for DF bil;   LOWER EXTREMITY MMT:  MMT Left eval Right  eval  Hip flexion 4- 4-  Hip extension    Hip abduction 4 4  Hip adduction    Hip internal rotation    Hip external rotation    Knee flexion 5 5  Knee extension 5 5  Ankle dorsiflexion    Ankle plantarflexion    Ankle inversion    Ankle eversion     (Blank rows = not tested)  LOWER EXTREMITY SPECIAL TESTS:  Increased pain at medial ankle, below malleolus with standing, weight bearing and walking.    GAIT: Distance walked: 150 ft Assistive device utilized: None Level of assistance: Complete Independence Comments: early heel rise with push off   TODAY'S TREATMENT:                                                                                                                              DATE:   03/15/2023 Therapeutic Exercise: Aerobic: Supine:   bridging 2 x 10;  SLR x 10 bil with TA;  Clams GTB with TA x 20;   Prone quad stretch - manual x 3 min on L;  S/L:  hip abd 2 x 10 bil;   Seated: Standing:   Stretches:   LTR x 15;   piriformis 30 sec x 3;  Neuromuscular Re-education: heel raises 2 x 10 cueing for form and foot position for push off;  Tandem stance 30 sec x 3 bil;   SLS 10 sec x 3 bil;  Manual Therapy: Therapeutic Activity: Self Care:    PATIENT EDUCATION:  Education details: updated and reviewed HEP Person educated: Patient Education method: Explanation, Demonstration, Tactile cues, Verbal cues, and Handouts Education comprehension: verbalized understanding, returned demonstration, verbal cues required, tactile cues required, and needs further education   HOME EXERCISE PROGRAM: Access Code: [email or web address] URL: [email or web address] Date: 03/02/2023 Prepared by: Sedalia Muta  Exercises - Hooklying Single Knee to Chest Stretch  - 2 x daily - 3  reps - 30 hold - Supine Piriformis Stretch Pulling Heel to Hip  - 2 x daily - 3 reps - 30 hold - Supine Lower Trunk Rotation  - 2 x daily - 10 reps -  5 hold - Prone Quadriceps Stretch with Strap  - 1 x daily - 4 reps - 20 hold - Supine Bridge  - 1 x daily - 1-2 sets - 10 reps  ASSESSMENT:  CLINICAL IMPRESSION: 03/15/2023 Pt progressing well with pain. Good tolerance for ther ex for back and hips. Added ankle stability today, pt challenged with this, and with mild pain at medial ankle. Plan to progress hip/core and ankle stabilization as tolerated.    Eval: Patient presents with primary complaint of  pain in multiple locations. She has most pain in L ankle. Pain in medial ankle, increased with weight bearing and walking. She would benefit from updated footwear for decreasing overcompensation as in current footwear for more neutral alignment. She has increased pain in low back and into bil hips. She has some symptoms of hip bursitis, but could also be stemming from lumbar spine. Will benefit from back and hip mobility and strength to improve.  It seems ankle issue is separate from back with testing today. Pt with decreased ability for full functional activities. Pt will  benefit from skilled PT to improve deficits and pain and to return to PLOF.   OBJECTIVE IMPAIRMENTS: Abnormal gait, decreased activity tolerance, decreased mobility, decreased strength, increased muscle spasms, and pain.   ACTIVITY LIMITATIONS: lifting, bending, standing, squatting, sleeping, stairs, transfers, and locomotion level  PARTICIPATION LIMITATIONS: meal prep, cleaning, laundry, shopping, and community activity  PERSONAL FACTORS: Time since onset of injury/illness/exacerbation are also affecting patient's functional outcome.   REHAB POTENTIAL: Good  CLINICAL DECISION MAKING: Evolving/moderate complexity  EVALUATION COMPLEXITY: Moderate   GOALS: Goals reviewed with patient? Yes  SHORT TERM GOALS: Target date:  03/23/2023   Pt to be independent with initial HEP  Goal status: INITIAL  2.  Pt to report decreased pain in back/hips by 50 %   Goal status: INITIAL  3.  Pt to demo ability for weight bearing and walking with 50 % less pain in ankle.   Goal status: INITIAL    LONG TERM GOALS: Target date:  04/27/2023  Pt to be independent with final HEP  Goal status: INITIAL  2.  Pt to report decreased pain in ankle to 0-2/10 with at least 30 min of standing, walking, exercise  Goal status: INITIAL  3.  Pt to report decreased pain to 0-2/10 in hips/back with at least 30 min of standing, walking, exercise.   Goal status: INITIAL  4.  Pt to be compliant with optimal footwear/orthotics as appropriate for her foot type.   Goal status: INITIAL  PLAN:  PT FREQUENCY: 1-2x/week  PT DURATION: 8 weeks  PLANNED INTERVENTIONS: Therapeutic exercises, Therapeutic activity, Neuromuscular re-education, Patient/Family education, Self Care, Joint mobilization, Joint manipulation, Stair training, Orthotic/Fit training, DME instructions, Aquatic Therapy, Dry Needling, Electrical stimulation, Cryotherapy, Moist heat, Taping, Ultrasound, Ionotophoresis 4mg /ml Dexamethasone, Manual therapy,  Vasopneumatic device, Traction, Spinal manipulation, Spinal mobilization,Balance training, Gait training,   PLAN FOR NEXT SESSION: bike, hip strength, back and hip mobility, L ankle strength , foot position.    Sedalia Muta, PT, DPT 10:53 AM  03/15/23

## 2023-03-17 ENCOUNTER — Encounter: Payer: Self-pay | Admitting: Physical Therapy

## 2023-03-17 ENCOUNTER — Ambulatory Visit: Payer: Medicare Other | Admitting: Physical Therapy

## 2023-03-17 DIAGNOSIS — M25572 Pain in left ankle and joints of left foot: Secondary | ICD-10-CM | POA: Diagnosis not present

## 2023-03-17 DIAGNOSIS — M25551 Pain in right hip: Secondary | ICD-10-CM | POA: Diagnosis not present

## 2023-03-17 DIAGNOSIS — M25552 Pain in left hip: Secondary | ICD-10-CM

## 2023-03-17 DIAGNOSIS — M5459 Other low back pain: Secondary | ICD-10-CM | POA: Diagnosis not present

## 2023-03-17 NOTE — Therapy (Signed)
 OUTPATIENT PHYSICAL THERAPY LOWER EXTREMITY TREATMENT   Patient Name: Dana Dillon MRN: [id number] DOB:1955-09-08, 67 y.o., female Today's Date: 03/17/2023   END OF SESSION:  PT End of Session - 03/17/23 1108     Visit Number 3    Number of Visits 16    Date for PT Re-Evaluation 04/27/23    Authorization Type [employer] medicare    PT Start Time 1018    PT Stop Time 1058    PT Time Calculation (min) 40 min    Activity Tolerance Patient tolerated treatment well    Behavior During Therapy WFL for tasks assessed/performed              Past Medical History:  Diagnosis Date   Hot flashes    Hypertension    LYMPHOPENIA 05/04/2007   saw oncology- later normalized. ? virus related   RHINITIS 12/03/2009   Past Surgical History:  Procedure Laterality Date   CARPAL TUNNEL RELEASE     right side. Dr. Garfield Cornea SECTION     x2   Patient Active Problem List   Diagnosis Date Noted   Osteopenia of right femoral neck 07/21/2021   Aortic atherosclerosis (HCC) 07/08/2021   Nuclear sclerotic cataract of both eyes 06/05/2021   Posterior vitreous detachment of right eye 06/05/2021   Loud snoring 05/12/2021   Nasal deviation 05/12/2021   Cystoid macular edema of right eye 03/04/2021   Exudative retinopathy, right eye 03/04/2021   Right epiretinal membrane 03/04/2021   Hyperlipidemia 07/10/2020   Anxiety 05/12/2018   Hypothyroidism 05/12/2018   Plantar fasciitis of left foot 05/14/2017   Caregiver burden 12/11/2016   Former smoker 12/28/2015   Hot flash, menopausal 06/29/2014   Allergic rhinitis 12/03/2009   Hypertension 11/13/2005    PCP: Tana Conch  REFERRING PROVIDER: Aris [place]  REFERRING DIAG: myalgia, back pain, bil hip pain, L ankle pain   THERAPY DIAG:  Other low back pain  Pain in left ankle and joints of left foot  Pain of both hip joints  Rationale for Evaluation and Treatment: Rehabilitation  ONSET DATE:    SUBJECTIVE:   SUBJECTIVE  STATEMENT: Pt states feeling achey yesterday in back . Stretching and heat helped. Ankle has not been painful. Less sore today.   Eval: Pt states pain in multiple areas. Reports increased L  Ankle pain  a couple months ago, after she was having pain in R hamstring. Leg pain better now, but ankle still painful/worsening. Pain started at medial high ankle, continued pain below medial malleolus.  States Back pain low back pain into bil hips, into anterior hips, worse on L side. Worse with sleeping/ having trouble sleeping on her sides. Pain with initial standing- shooting into bil hips.  Has stopped taking statin,  and took potassium, feeling a bit better, was feeling very achey all over.   Shoulder doing well, did have previous pain there.  Previous R side HS strain, now feeling fine with that.   PERTINENT HISTORY: none   PAIN:  Are you having pain? Yes: NPRS scale: up to 0-2/10 with sleeping  Pain location: bil hips Pain description: sore Aggravating factors: sleeping on her sides  Relieving factors: none   Are you having pain? Yes: NPRS scale: 4/10 Pain location: ankle Pain description: pain, constant Aggravating factors: weight bearing  Relieving factors: stretching her back  Are you having pain? Yes: NPRS scale: 0-2/10 Pain location: back Pain description: dull ache on and off, tight  Aggravating factors: muscle  relaxer Relieving factors: none stated    PRECAUTIONS: None  WEIGHT BEARING RESTRICTIONS: No  FALLS:  Has patient fallen in last 6 months? No   PLOF: Independent  PATIENT GOALS:  Decreased pain in foot, hips, back   NEXT MD VISIT:   OBJECTIVE:   DIAGNOSTIC FINDINGS:   PATIENT SURVEYS:   COGNITION: Overall cognitive status: Within functional limits for tasks assessed     SENSATION: WFL  EDEMA:   POSTURE:    foot: with shoes on: overcorrection into supination  PALPATION: Tenderness at bil gr troch, minimal soreness in low back/SI with  palpation;  Minimal/no tenderness at medial ankle with palpation.   LOWER EXTREMITY ROM: Hips: WFL Back: WFL Ankle: wfl, mild limitation for DF bil;   LOWER EXTREMITY MMT:  MMT Left eval Right  eval  Hip flexion 4- 4-  Hip extension    Hip abduction 4 4  Hip adduction    Hip internal rotation    Hip external rotation    Knee flexion 5 5  Knee extension 5 5  Ankle dorsiflexion    Ankle plantarflexion    Ankle inversion    Ankle eversion     (Blank rows = not tested)  LOWER EXTREMITY SPECIAL TESTS:  Increased pain at medial ankle, below malleolus with standing, weight bearing and walking.    GAIT: Distance walked: 150 ft Assistive device utilized: None Level of assistance: Complete Independence Comments: early heel rise with push off   TODAY'S TREATMENT:                                                                                                                              DATE:   03/17/2023 Therapeutic Exercise: Aerobic: Supine:   bridging 2 x 10;  dead bug x 15   ;  SLR 2 x 5 bil with TA;   Clams GTB with TA x 20;   S/L:   Seated:  Standing:  hip abd and ext 2 x 10 bil;   Stretches:   LTR x 10;   piriformis 30 sec x 3 supine and seated ;  DKTC 20 sec  3;  pelvic tilts x 10;   Neuromuscular Re-education:  heel raises 2 x 10 cueing for form and foot position for push off;  SLS 10 sec x 3 bil;  Manual Therapy: Therapeutic Activity: Self Care:   Therapeutic Exercise: Aerobic: Supine:   bridging 2 x 10;  SLR x 10 bil with TA;  Clams GTB with TA x 20;   Prone quad stretch - manual x 3 min on L;  S/L:  hip abd 2 x 10 bil;   Seated: Standing:   Stretches:   LTR x 15;   piriformis 30 sec x 3;  Neuromuscular Re-education:  heel raises 2 x 10 cueing for form and foot position for push off;  Tandem stance 30 sec x 3 bil;   SLS 10 sec x 3 bil;  Manual Therapy: Therapeutic Activity: Self Care:    PATIENT EDUCATION:  Education details: updated and reviewed  HEP Person educated: Patient Education method: Explanation, Demonstration, Tactile cues, Verbal cues, and Handouts Education comprehension: verbalized understanding, returned demonstration, verbal cues required, tactile cues required, and needs further education   HOME EXERCISE PROGRAM: Access Code: [email or web address] URL: [email or web address] Date: 03/02/2023 Prepared by: Sedalia Muta  Exercises - Hooklying Single Knee to Chest Stretch  - 2 x daily - 3 reps - 30 hold - Supine Piriformis Stretch Pulling Heel to Hip  - 2 x daily - 3 reps - 30 hold - Supine Lower Trunk Rotation  - 2 x daily - 10 reps - 5 hold - Prone Quadriceps Stretch with Strap  - 1 x daily - 4 reps - 20 hold - Supine Bridge  - 1 x daily - 1-2 sets - 10 reps  ASSESSMENT:  CLINICAL IMPRESSION: 03/17/2023 Pt doing well with management, did use heat and stretching to improve soreness yesterday. Good tolerance for activities today, able to progress light strength for hip and core without pain. Continued strength and stability for ankle, which she is challenged with.     Eval: Patient presents with primary complaint of  pain in multiple locations. She has most pain in L ankle. Pain in medial ankle, increased with weight bearing and walking. She would benefit from updated footwear for decreasing overcompensation as in current footwear for more neutral alignment. She has increased pain in low back and into bil hips. She has some symptoms of hip bursitis, but could also be stemming from lumbar spine. Will benefit from back and hip mobility and strength to improve.  It seems ankle issue is separate from back with testing today. Pt with decreased ability for full functional activities. Pt will  benefit from skilled PT to improve deficits and pain and to return to PLOF.   OBJECTIVE IMPAIRMENTS: Abnormal gait, decreased activity tolerance, decreased mobility, decreased strength, increased muscle spasms, and pain.   ACTIVITY  LIMITATIONS: lifting, bending, standing, squatting, sleeping, stairs, transfers, and locomotion level  PARTICIPATION LIMITATIONS: meal prep, cleaning, laundry, shopping, and community activity  PERSONAL FACTORS: Time since onset of injury/illness/exacerbation are also affecting patient's functional outcome.   REHAB POTENTIAL: Good  CLINICAL DECISION MAKING: Evolving/moderate complexity  EVALUATION COMPLEXITY: Moderate   GOALS: Goals reviewed with patient? Yes  SHORT TERM GOALS: Target date: 03/23/2023   Pt to be independent with initial HEP  Goal status: INITIAL  2.  Pt to report decreased pain in back/hips by 50 %   Goal status: INITIAL  3.  Pt to demo ability for weight bearing and walking with 50 % less pain in ankle.   Goal status: INITIAL    LONG TERM GOALS: Target date:  04/27/2023  Pt to be independent with final HEP  Goal status: INITIAL  2.  Pt to report decreased pain in ankle to 0-2/10 with at least 30 min of standing, walking, exercise  Goal status: INITIAL  3.  Pt to report decreased pain to 0-2/10 in hips/back with at least 30 min of standing, walking, exercise.   Goal status: INITIAL  4.  Pt to be compliant with optimal footwear/orthotics as appropriate for her foot type.   Goal status: INITIAL  PLAN:  PT FREQUENCY: 1-2x/week  PT DURATION: 8 weeks  PLANNED INTERVENTIONS: Therapeutic exercises, Therapeutic activity, Neuromuscular re-education, Patient/Family education, Self Care, Joint mobilization, Joint manipulation, Stair training, Orthotic/Fit training, DME instructions, Aquatic Therapy, Dry Needling, Electrical stimulation, Cryotherapy, Moist  heat, Taping, Ultrasound, Ionotophoresis 4mg /ml Dexamethasone, Manual therapy,  Vasopneumatic device, Traction, Spinal manipulation, Spinal mobilization,Balance training, Gait training,   PLAN FOR NEXT SESSION: bike, hip strength, back and hip mobility, L ankle strength , foot position.    Sedalia Muta, PT, DPT 11:09 AM  03/17/23

## 2023-03-22 ENCOUNTER — Encounter: Payer: Self-pay | Admitting: Pulmonary Disease

## 2023-03-22 ENCOUNTER — Ambulatory Visit: Payer: Medicare Other | Admitting: Physical Therapy

## 2023-03-22 ENCOUNTER — Encounter: Payer: Self-pay | Admitting: Physical Therapy

## 2023-03-22 DIAGNOSIS — M5459 Other low back pain: Secondary | ICD-10-CM | POA: Diagnosis not present

## 2023-03-22 DIAGNOSIS — M25552 Pain in left hip: Secondary | ICD-10-CM

## 2023-03-22 DIAGNOSIS — M25551 Pain in right hip: Secondary | ICD-10-CM

## 2023-03-22 DIAGNOSIS — M25572 Pain in left ankle and joints of left foot: Secondary | ICD-10-CM | POA: Diagnosis not present

## 2023-03-22 NOTE — Therapy (Signed)
 OUTPATIENT PHYSICAL THERAPY LOWER EXTREMITY TREATMENT   Patient Name: Dana Dillon MRN: [id number] DOB:08/17/1955, 68 y.o., female Today's Date: 03/22/2023   END OF SESSION:  PT End of Session - 03/22/23 1036     Visit Number 4    Number of Visits 16    Date for PT Re-Evaluation 04/27/23    Authorization Type [employer] medicare    PT Start Time 1020    PT Stop Time 1100    PT Time Calculation (min) 40 min    Activity Tolerance Patient tolerated treatment well    Behavior During Therapy WFL for tasks assessed/performed               Past Medical History:  Diagnosis Date   Hot flashes    Hypertension    LYMPHOPENIA 05/04/2007   saw oncology- later normalized. ? virus related   RHINITIS 12/03/2009   Past Surgical History:  Procedure Laterality Date   CARPAL TUNNEL RELEASE     right side. Dr. Garfield Cornea SECTION     x2   Patient Active Problem List   Diagnosis Date Noted   Osteopenia of right femoral neck 07/21/2021   Aortic atherosclerosis (HCC) 07/08/2021   Nuclear sclerotic cataract of both eyes 06/05/2021   Posterior vitreous detachment of right eye 06/05/2021   Loud snoring 05/12/2021   Nasal deviation 05/12/2021   Cystoid macular edema of right eye 03/04/2021   Exudative retinopathy, right eye 03/04/2021   Right epiretinal membrane 03/04/2021   Hyperlipidemia 07/10/2020   Anxiety 05/12/2018   Hypothyroidism 05/12/2018   Plantar fasciitis of left foot 05/14/2017   Caregiver burden 12/11/2016   Former smoker 12/28/2015   Hot flash, menopausal 06/29/2014   Allergic rhinitis 12/03/2009   Hypertension 11/13/2005    PCP: Tana Conch  REFERRING PROVIDER: Aris [place]  REFERRING DIAG: myalgia, back pain, bil hip pain, L ankle pain   THERAPY DIAG:  Other low back pain  Pain in left ankle and joints of left foot  Pain of both hip joints  Rationale for Evaluation and Treatment: Rehabilitation  ONSET DATE:    SUBJECTIVE:   SUBJECTIVE  STATEMENT: Pt has switched BP meds, feeling that BP has been very up and down in the last week or so. Feels good today. Has f/u with MD on Friday to discuss. BP: 110/80 today at start of session.   Eval: Pt states pain in multiple areas. Reports increased L  Ankle pain  a couple months ago, after she was having pain in R hamstring. Leg pain better now, but ankle still painful/worsening. Pain started at medial high ankle, continued pain below medial malleolus.  States Back pain low back pain into bil hips, into anterior hips, worse on L side. Worse with sleeping/ having trouble sleeping on her sides. Pain with initial standing- shooting into bil hips.  Has stopped taking statin,  and took potassium, feeling a bit better, was feeling very achey all over.   Shoulder doing well, did have previous pain there.  Previous R side HS strain, now feeling fine with that.   PERTINENT HISTORY: none   PAIN:  Are you having pain? Yes: NPRS scale: up to 0-2/10 with sleeping  Pain location: bil hips Pain description: sore Aggravating factors: sleeping on her sides  Relieving factors: none   Are you having pain? Yes: NPRS scale: 4/10 Pain location: ankle Pain description: pain, constant Aggravating factors: weight bearing  Relieving factors: stretching her back  Are you having pain?  Yes: NPRS scale: 0-2/10 Pain location: back Pain description: dull ache on and off, tight  Aggravating factors: muscle relaxer Relieving factors: none stated    PRECAUTIONS: None  WEIGHT BEARING RESTRICTIONS: No  FALLS:  Has patient fallen in last 6 months? No   PLOF: Independent  PATIENT GOALS:  Decreased pain in foot, hips, back   NEXT MD VISIT:   OBJECTIVE:   DIAGNOSTIC FINDINGS:   PATIENT SURVEYS:   COGNITION: Overall cognitive status: Within functional limits for tasks assessed     SENSATION: WFL  EDEMA:   POSTURE:    foot: with shoes on: overcorrection into  supination  PALPATION: Tenderness at bil gr troch, minimal soreness in low back/SI with palpation;  Minimal/no tenderness at medial ankle with palpation.   LOWER EXTREMITY ROM: Hips: WFL Back: WFL Ankle: wfl, mild limitation for DF bil;   LOWER EXTREMITY MMT:  MMT Left eval Right  eval  Hip flexion 4- 4-  Hip extension    Hip abduction 4 4  Hip adduction    Hip internal rotation    Hip external rotation    Knee flexion 5 5  Knee extension 5 5  Ankle dorsiflexion    Ankle plantarflexion    Ankle inversion    Ankle eversion     (Blank rows = not tested)  LOWER EXTREMITY SPECIAL TESTS:  Increased pain at medial ankle, below malleolus with standing, weight bearing and walking.    GAIT: Distance walked: 150 ft Assistive device utilized: None Level of assistance: Complete Independence Comments: early heel rise with push off   TODAY'S TREATMENT:                                                                                                                              DATE:   03/22/2023 Therapeutic Exercise: Aerobic: Supine:   bridging 2 x 10;  TA with breathing x 10:  Supine march x 15 with same;   dead bug x 10 ;  SLR 2 x 10 bil with TA;    S/L:   Seated:  Standing:  hip abd and ext 2 x 10 bil;   step up 6 in, x 10 bil for hip strength Stretches:   LTR x 10;   piriformis 30 sec x 3 supine and seated ;  DKTC 20 sec  3;  pelvic tilts x 10;   Neuromuscular Re-education:  heel raises 2 x 10 cueing for form and foot position for push off;  SLS 10 sec x 3 bil; Slow March x 20;  Manual Therapy: Therapeutic Activity: Self Care:   Therapeutic Exercise: Aerobic: Supine:   bridging 2 x 10;  SLR x 10 bil with TA;  Clams GTB with TA x 20;   Prone quad stretch - manual x 3 min on L;  S/L:  hip abd 2 x 10 bil;   Seated: Standing:   Stretches:   LTR x 15;   piriformis 30  sec x 3;  Neuromuscular Re-education:  heel raises 2 x 10 cueing for form and foot position for push  off;  Tandem stance 30 sec x 3 bil;   SLS 10 sec x 3 bil;  Manual Therapy: Therapeutic Activity: Self Care:    PATIENT EDUCATION:  Education details: updated and reviewed HEP Person educated: Patient Education method: Explanation, Demonstration, Tactile cues, Verbal cues, and Handouts Education comprehension: verbalized understanding, returned demonstration, verbal cues required, tactile cues required, and needs further education   HOME EXERCISE PROGRAM: Access Code: [email or web address] URL: [email or web address] Date: 03/02/2023 Prepared by: Sedalia Muta  Exercises - Hooklying Single Knee to Chest Stretch  - 2 x daily - 3 reps - 30 hold - Supine Piriformis Stretch Pulling Heel to Hip  - 2 x daily - 3 reps - 30 hold - Supine Lower Trunk Rotation  - 2 x daily - 10 reps - 5 hold - Prone Quadriceps Stretch with Strap  - 1 x daily - 4 reps - 20 hold - Supine Bridge  - 1 x daily - 1-2 sets - 10 reps   ASSESSMENT:  CLINICAL IMPRESSION:  03/22/2023 Pt with good tolerance for strengthening today. No pain with activities today. Overall with little pain this week and progressing well. She is challenged with core stability and TA, improved ability after education and practice today, will benefit from continued work on this.  Reviewed importance of continued strength and HEP. Plan to progress ankle stability/balance next visit as able, if pt feeling better with BP.    Eval: Patient presents with primary complaint of  pain in multiple locations. She has most pain in L ankle. Pain in medial ankle, increased with weight bearing and walking. She would benefit from updated footwear for decreasing overcompensation as in current footwear for more neutral alignment. She has increased pain in low back and into bil hips. She has some symptoms of hip bursitis, but could also be stemming from lumbar spine. Will benefit from back and hip mobility and strength to improve.  It seems ankle issue is  separate from back with testing today. Pt with decreased ability for full functional activities. Pt will  benefit from skilled PT to improve deficits and pain and to return to PLOF.   OBJECTIVE IMPAIRMENTS: Abnormal gait, decreased activity tolerance, decreased mobility, decreased strength, increased muscle spasms, and pain.   ACTIVITY LIMITATIONS: lifting, bending, standing, squatting, sleeping, stairs, transfers, and locomotion level  PARTICIPATION LIMITATIONS: meal prep, cleaning, laundry, shopping, and community activity  PERSONAL FACTORS: Time since onset of injury/illness/exacerbation are also affecting patient's functional outcome.   REHAB POTENTIAL: Good  CLINICAL DECISION MAKING: Evolving/moderate complexity  EVALUATION COMPLEXITY: Moderate   GOALS: Goals reviewed with patient? Yes  SHORT TERM GOALS: Target date: 03/23/2023   Pt to be independent with initial HEP  Goal status: INITIAL  2.  Pt to report decreased pain in back/hips by 50 %   Goal status: INITIAL  3.  Pt to demo ability for weight bearing and walking with 50 % less pain in ankle.   Goal status: INITIAL    LONG TERM GOALS: Target date:  04/27/2023  Pt to be independent with final HEP  Goal status: INITIAL  2.  Pt to report decreased pain in ankle to 0-2/10 with at least 30 min of standing, walking, exercise  Goal status: INITIAL  3.  Pt to report decreased pain to 0-2/10 in hips/back with at least 30 min of standing, walking, exercise.   Goal status:  INITIAL  4.  Pt to be compliant with optimal footwear/orthotics as appropriate for her foot type.   Goal status: INITIAL  PLAN:  PT FREQUENCY: 1-2x/week  PT DURATION: 8 weeks  PLANNED INTERVENTIONS: Therapeutic exercises, Therapeutic activity, Neuromuscular re-education, Patient/Family education, Self Care, Joint mobilization, Joint manipulation, Stair training, Orthotic/Fit training, DME instructions, Aquatic Therapy, Dry Needling,  Electrical stimulation, Cryotherapy, Moist heat, Taping, Ultrasound, Ionotophoresis 4mg /ml Dexamethasone, Manual therapy,  Vasopneumatic device, Traction, Spinal manipulation, Spinal mobilization,Balance training, Gait training,   PLAN FOR NEXT SESSION: bike, hip strength, back and hip mobility, L ankle strength , foot position.    Sedalia Muta, PT, DPT 10:36 AM  03/22/23

## 2023-03-24 ENCOUNTER — Encounter: Payer: Medicare Other | Admitting: Physical Therapy

## 2023-03-26 ENCOUNTER — Encounter: Payer: Self-pay | Admitting: Family Medicine

## 2023-03-26 ENCOUNTER — Ambulatory Visit (INDEPENDENT_AMBULATORY_CARE_PROVIDER_SITE_OTHER): Payer: Medicare Other | Admitting: Family Medicine

## 2023-03-26 VITALS — BP 132/84 | HR 84 | Temp 97.5°F | Ht 62.0 in | Wt 164.5 lb

## 2023-03-26 DIAGNOSIS — I10 Essential (primary) hypertension: Secondary | ICD-10-CM

## 2023-03-26 DIAGNOSIS — G720 Drug-induced myopathy: Secondary | ICD-10-CM

## 2023-03-26 DIAGNOSIS — E785 Hyperlipidemia, unspecified: Secondary | ICD-10-CM | POA: Diagnosis not present

## 2023-03-26 DIAGNOSIS — E039 Hypothyroidism, unspecified: Secondary | ICD-10-CM

## 2023-03-26 LAB — COMPREHENSIVE METABOLIC PANEL
ALT: 20 U/L (ref 0–35)
AST: 20 U/L (ref 0–37)
Albumin: 4.4 g/dL (ref 3.5–5.2)
Alkaline Phosphatase: 49 U/L (ref 39–117)
BUN: 14 mg/dL (ref 6–23)
CO2: 28 meq/L (ref 19–32)
Calcium: 9.2 mg/dL (ref 8.4–10.5)
Chloride: 101 meq/L (ref 96–112)
Creatinine, Ser: 0.65 mg/dL (ref 0.40–1.20)
GFR: 90.34 mL/min (ref >60.00)
Glucose, Bld: 90 mg/dL (ref 70–99)
Potassium: 3.7 meq/L (ref 3.5–5.1)
Sodium: 138 meq/L (ref 135–145)
Total Bilirubin: 0.7 mg/dL (ref 0.2–1.2)
Total Protein: 7.1 g/dL (ref 6.0–8.3)

## 2023-03-26 LAB — LIPID PANEL
Cholesterol: 231 mg/dL — ABNORMAL HIGH (ref 0–200)
HDL: 61.6 mg/dL (ref >39.00)
LDL Cholesterol: 147 mg/dL — ABNORMAL HIGH (ref 0–99)
NonHDL: 169.62
Total CHOL/HDL Ratio: 4
Triglycerides: 113 mg/dL (ref 0.0–149.0)
VLDL: 22.6 mg/dL (ref 0.0–40.0)

## 2023-03-26 MED ORDER — LOSARTAN POTASSIUM-HCTZ 100-25 MG PO TABS: 1.0000 | ORAL_TABLET | Freq: Every day | ORAL | Status: DC

## 2023-03-26 MED ORDER — POTASSIUM CHLORIDE CRYS ER 10 MEQ PO TBCR: 10.0000 meq | EXTENDED_RELEASE_TABLET | Freq: Every day | ORAL | 3 refills | Status: AC

## 2023-03-26 NOTE — Progress Notes (Signed)
 Phone [phone] In person visit   Subjective:   Dana Dillon is a 68 y.o. year old very pleasant female patient who presents for/with See problem oriented charting Chief Complaint  Patient presents with   Medical Management of Chronic Issues   Hyperlipidemia   Hypertension    Pt states bp is still fluctuating this morning it was 137/97, yesterday 118/59 and 135/88    Past Medical History-  Patient Active Problem List   Diagnosis Date Noted   Hyperlipidemia 07/10/2020    Priority: High   Osteopenia of right femoral neck 07/21/2021    Priority: Medium    Aortic atherosclerosis (HCC) 07/08/2021    Priority: Medium    Anxiety 05/12/2018    Priority: Medium    Hypothyroidism 05/12/2018    Priority: Medium    Hypertension 11/13/2005    Priority: Medium    Nuclear sclerotic cataract of both eyes 06/05/2021    Priority: Low   Posterior vitreous detachment of right eye 06/05/2021    Priority: Low   Loud snoring 05/12/2021    Priority: Low   Nasal deviation 05/12/2021    Priority: Low   Cystoid macular edema of right eye 03/04/2021    Priority: Low   Exudative retinopathy, right eye 03/04/2021    Priority: Low   Right epiretinal membrane 03/04/2021    Priority: Low   Plantar fasciitis of left foot 05/14/2017    Priority: Low   Caregiver burden 12/11/2016    Priority: Low   Former smoker 12/28/2015    Priority: Low   Hot flash, menopausal 06/29/2014    Priority: Low   Allergic rhinitis 12/03/2009    Priority: Low    Medications- reviewed and updated Current Outpatient Medications  Medication Sig Dispense Refill   Cholecalciferol (VITAMIN D3 PO) Take 5,000 Units by mouth daily.     levothyroxine (SYNTHROID) 50 MCG tablet TAKE 1 TABLET BY MOUTH IN THE  MORNING 100 tablet 2   losartan-hydrochlorothiazide (HYZAAR) 100-12.5 MG tablet Take 1 tablet by mouth daily.     MAGNESIUM CITRATE PO Take by mouth.     potassium chloride (KLOR-CON M) 10 MEQ tablet Take 1 tablet  (10 mEq total) by mouth daily. 90 tablet 3   rosuvastatin (CRESTOR) 10 MG tablet Take 1 tablet (10 mg total) by mouth 3 (three) times a week. Take 1 tablet three times a week 70 tablet 1   cyclobenzaprine (FLEXERIL) 5 MG tablet Take 1 tablet (5 mg total) by mouth at bedtime. (Patient not taking: Reported on 03/26/2023) 30 tablet 0   No current facility-administered medications for this visit.     Objective:  BP 132/84   Pulse 84   Temp (!) 97.5 F (36.4 C)   Ht 5\' 2"  (1.575 m)   Wt 164 lb 8 oz (74.6 kg)   SpO2 99%   BMI 30.09 kg/m  Gen: NAD, resting comfortably CV: RRR no murmurs rubs or gallops Lungs: CTAB no crackles, wheeze, rhonchi Ext: trace edema Skin: warm, dry    Assessment and Plan   #social update- will be grandparents in July! g  #hypertension S: medication: losartan Hydrochlorothiazide 100-25 mg --> 100-12.5 mg daily (lowers risk of low potassium). Overall felt much better on prior dose- has more swelling in hands, more fluctuations in blood pressure with lower dose, not sleeping as well and feels more sweating. Did feel less achy with 5 days of potassium.  Home readings #s: states bp is still fluctuating this morning it was 137/97,  yesterday 118/59 and 135/88 . Gets headache(s) sometimes if running higher and can feel dizzy when runs lower.  A/P: Blood pressure well controlled in office but fluctuating more at home and more side effects so we will switch back to losartan hydrochlorothiazide 100-25 mg and have her update me with how she is feeling in about a month -check potassium today but as staying on hydrochlorothiazide 25 mg will add in daily low dose potassium 10 meq - can recheck that next visit -flexeril is rare in regards to interaction with that and potassium -she is going to update Tina at April 8th visit on blood pressure and side effects profile- hoping improved   #hyperlipidemia-in 2023 79th percentile CT cardiac scoringat 94.7 #Aortic atherosclerosis S:  Medication:rosuvastatin 3 days a week previously working ok but later with generalized achiness and trialed off since last visit -shed like to see how her #s look today  A/P: lipids likely trending up- update panel today - if LDL above 70 with statin intolerance could consider lipid clinic referral or trial zetia as alternate maybe with once weekly statin -starting to progress now with physical therapy from [facility]   #hypothyroidism S: compliant On thyroid medication-levothyroxine 50 mcg daily Lab Results  Component Value Date   TSH 3.69 02/15/2023  A/P:stable- continue current medicines    Recommended follow up: Return in about 6 months (around 09/26/2023) for physical or sooner if needed.Schedule b4 you leave. Future Appointments  Date Time Provider Department Center  03/29/2023 11:00 AM Sedalia Muta, PT [facility] None  04/01/2023 11:00 AM Sedalia Muta, PT [facility] None  04/20/2023 11:20 AM [facility] ANNUAL WELLNESS VISIT 1 [facility]   Lab/Order associations: fasting other than black coffee   ICD-10-CM   1. Primary hypertension  I10 Comp Met (CMET)    2. 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 Hypothyroidism, unspecified type  E03.9       Meds ordered this encounter  Medications   losartan-hydrochlorothiazide (HYZAAR) 100-25 MG tablet    Sig: Take 1 tablet by mouth daily.   potassium chloride (KLOR-CON M) 10 MEQ tablet    Sig: Take 1 tablet (10 mEq total) by mouth daily.    Dispense:  90 tablet    Refill:  3    Return precautions advised.  Tana Conch, MD

## 2023-03-26 NOTE — Patient Instructions (Addendum)
 Blood pressure well controlled in office but fluctuating more at home and more side effects so we will switch back to losartan hydrochlorothiazide 100-25 mg and have her update me with how she is feeling in about a month -check potassium today but as staying on hydrochlorothiazide 25 mg will add in daily low dose potassium 10 meq - can recheck that next visit  May need to look at cardiology visit or alternate cholesterol treatments depending on findings  Please stop by lab before you go If you have mychart- we will send your results within 3 business days of [place] receiving them.  If you do not have mychart- we will call you about results within 5 business days of [place] receiving them.  *please also note that you will see labs on mychart as soon as they post. I will later go in and write notes on them- will say "notes from Dr. Durene Cal"   Recommended follow up: Return in about 6 months (around 09/26/2023) for physical or sooner if needed.Schedule b4 you leave.

## 2023-03-29 ENCOUNTER — Encounter: Payer: Self-pay | Admitting: Physician Assistant

## 2023-03-29 ENCOUNTER — Ambulatory Visit (INDEPENDENT_AMBULATORY_CARE_PROVIDER_SITE_OTHER): Admitting: Physician Assistant

## 2023-03-29 ENCOUNTER — Other Ambulatory Visit: Payer: Self-pay

## 2023-03-29 ENCOUNTER — Encounter: Admitting: Physical Therapy

## 2023-03-29 VITALS — BP 132/80 | HR 96 | Temp 98.2°F | Ht 62.0 in | Wt 165.2 lb

## 2023-03-29 DIAGNOSIS — R0981 Nasal congestion: Secondary | ICD-10-CM

## 2023-03-29 DIAGNOSIS — J029 Acute pharyngitis, unspecified: Secondary | ICD-10-CM | POA: Diagnosis not present

## 2023-03-29 LAB — POCT RAPID STREP A (OFFICE): Rapid Strep A Screen: NEGATIVE

## 2023-03-29 LAB — POC COVID19 BINAXNOW: SARS Coronavirus 2 Ag: NEGATIVE

## 2023-03-29 LAB — POCT INFLUENZA A/B
Influenza A, POC: NEGATIVE
Influenza B, POC: NEGATIVE

## 2023-03-29 MED ORDER — PREDNISONE 20 MG PO TABS: 20.0000 mg | ORAL_TABLET | Freq: Two times a day (BID) | ORAL | 0 refills | Status: DC

## 2023-03-29 MED ORDER — DOXYCYCLINE HYCLATE 100 MG PO TABS: 100.0000 mg | ORAL_TABLET | Freq: Two times a day (BID) | ORAL | 0 refills | Status: DC

## 2023-03-29 NOTE — Progress Notes (Signed)
 Dana Dillon is a 68 y.o. female here for a new problem.  History of Present Illness:   Chief Complaint  Patient presents with   Nasal Congestion    Pt states has been going on for almost 2 weeks. Nasal Congestion Ears feel like full. Sore throat headache. Has gotten worse over this past weekend.    HPI  Nasal congestion: Pt complains of nasal congestion, runny nose, sore throat, headache, and ear fullness  starting 2 weeks ago.  Also endorses some wheezing and tenderness in her forehead, neck, and teeth.  Symptoms worsened in the last 2-3 days.  Is taking Flonase, Claritin, and Sudafed to manage her sx.  States this tends to occur around this time every year.  Denies any measured fever.  No hx of asthma.  No known allergies to antibiotics.   Past Medical History:  Diagnosis Date   Hot flashes    Hypertension    LYMPHOPENIA 05/04/2007   saw oncology- later normalized. ? virus related   RHINITIS 12/03/2009     Social History   Tobacco Use   Smoking status: Former    Current packs/day: 0.00    Average packs/day: 1 pack/day for 15.0 years (15.0 ttl pk-yrs)    Types: Cigarettes    Start date: 05/21/1978    Quit date: 05/20/1993    Years since quitting: 29.8   Smokeless tobacco: Never  Substance Use Topics   Alcohol use: Yes    Alcohol/week: 7.0 standard drinks of alcohol    Types: 7 drink(s) per week   Drug use: No    Past Surgical History:  Procedure Laterality Date   CARPAL TUNNEL RELEASE     right side. Dr. Amanda Pea   CESAREAN SECTION     x2    Family History  Problem Relation Age of Onset   Breast cancer Sister    Hypertension Mother    Stroke Mother        in 80s   Heart failure Father    Hypertension Father    Hyperlipidemia Father     No Known Allergies  Current Medications:   Current Outpatient Medications:    Cholecalciferol (VITAMIN D3 PO), Take 5,000 Units by mouth daily., Disp: , Rfl:    doxycycline (VIBRA-TABS) 100 MG tablet, Take 1 tablet  (100 mg total) by mouth 2 (two) times daily., Disp: 20 tablet, Rfl: 0   levothyroxine (SYNTHROID) 50 MCG tablet, TAKE 1 TABLET BY MOUTH IN THE  MORNING, Disp: 100 tablet, Rfl: 2   losartan-hydrochlorothiazide (HYZAAR) 100-25 MG tablet, Take 1 tablet by mouth daily., Disp: , Rfl:    MAGNESIUM CITRATE PO, Take by mouth., Disp: , Rfl:    potassium chloride (KLOR-CON M) 10 MEQ tablet, Take 1 tablet (10 mEq total) by mouth daily., Disp: 90 tablet, Rfl: 3   predniSONE (DELTASONE) 20 MG tablet, Take 1 tablet (20 mg total) by mouth 2 (two) times daily with a meal., Disp: 10 tablet, Rfl: 0   cyclobenzaprine (FLEXERIL) 5 MG tablet, Take 1 tablet (5 mg total) by mouth at bedtime. (Patient not taking: Reported on 03/29/2023), Disp: 30 tablet, Rfl: 0   rosuvastatin (CRESTOR) 10 MG tablet, Take 1 tablet (10 mg total) by mouth 3 (three) times a week. Take 1 tablet three times a week (Patient not taking: Reported on 03/29/2023), Disp: 70 tablet, Rfl: 1   Review of Systems:   Negative unless otherwise specified per HPI.  Vitals:   Vitals:   03/29/23 1112  BP:  132/80  Pulse: 96  Temp: 98.2 F (36.8 C)  TempSrc: Temporal  SpO2: 98%  Weight: 165 lb 3.2 oz (74.9 kg)  Height: 5\' 2"  (1.575 m)     Body mass index is 30.22 kg/m.  Physical Exam:   Physical Exam Vitals and nursing note reviewed.  Constitutional:      General: She is not in acute distress.    Appearance: She is well-developed. She is not ill-appearing or toxic-appearing.  HENT:     Head: Normocephalic and atraumatic.     Right Ear: Tympanic membrane, ear canal and external ear normal. Tympanic membrane is not erythematous, retracted or bulging.     Left Ear: Tympanic membrane, ear canal and external ear normal. Tympanic membrane is not erythematous, retracted or bulging.     Nose:     Right Sinus: Frontal sinus tenderness present. No maxillary sinus tenderness.     Left Sinus: Frontal sinus tenderness present. No maxillary sinus  tenderness.     Mouth/Throat:     Pharynx: Uvula midline. No posterior oropharyngeal erythema.  Eyes:     General: Lids are normal.     Conjunctiva/sclera: Conjunctivae normal.  Neck:     Trachea: Trachea normal.  Cardiovascular:     Rate and Rhythm: Normal rate and regular rhythm.     Pulses: Normal pulses.     Heart sounds: Normal heart sounds, S1 normal and S2 normal.  Pulmonary:     Effort: Pulmonary effort is normal.     Breath sounds: Normal breath sounds. No decreased breath sounds, wheezing, rhonchi or rales.  Lymphadenopathy:     Cervical: No cervical adenopathy.  Skin:    General: Skin is warm and dry.  Neurological:     Mental Status: She is alert.     GCS: GCS eye subscore is 4. GCS verbal subscore is 5. GCS motor subscore is 6.  Psychiatric:        Speech: Speech normal.        Behavior: Behavior normal. Behavior is cooperative.    Results for orders placed or performed in visit on 03/29/23  POCT Influenza A/B  Result Value Ref Range   Influenza A, POC Negative Negative   Influenza B, POC Negative Negative  POCT rapid strep A  Result Value Ref Range   Rapid Strep A Screen Negative Negative  POC COVID-19 BinaxNow  Result Value Ref Range   SARS Coronavirus 2 Ag Negative Negative    Assessment and Plan:   1. Sore throat (Primary) - POCT Influenza A/B - POCT rapid strep A - POC COVID-19 BinaxNow  No red flags on exam.   Will initiate doxycycline and prednisone per orders.  Discussed taking medications as prescribed.  Reviewed return precautions including new or worsening fever, SOB, new or worsening cough or other concerns.  Push fluids and rest.  I recommend that patient follow-up if symptoms worsen or persist despite treatment x 7-10 days, sooner if needed.   I, Isabelle Course, acting as a Neurosurgeon for Jarold Motto, [place]., have documented all relevant documentation on the behalf of Jarold Motto, [place], as directed by  Jarold Motto, PA while in the  presence of Jarold Motto, [place].  I, Jarold Motto, [place], have reviewed all documentation for this visit. The documentation on 03/29/23 for the exam, diagnosis, procedures, and orders are all accurate and complete.  Jarold Motto, PA-C

## 2023-03-31 ENCOUNTER — Encounter: Payer: Self-pay | Admitting: Family Medicine

## 2023-03-31 LAB — LIPOPROTEIN A (LPA): Lipoprotein (a): 160 nmol/L — ABNORMAL HIGH (ref <75)

## 2023-04-01 ENCOUNTER — Other Ambulatory Visit: Payer: Self-pay

## 2023-04-01 ENCOUNTER — Encounter: Admitting: Physical Therapy

## 2023-04-01 DIAGNOSIS — E785 Hyperlipidemia, unspecified: Secondary | ICD-10-CM

## 2023-04-05 ENCOUNTER — Ambulatory Visit: Payer: Self-pay

## 2023-04-05 NOTE — Telephone Encounter (Signed)
  Chief Complaint: new dizziness Symptoms: dizzy Frequency: intermittent  Pertinent Negatives: Patient denies chest pain, difficulty breathing Disposition: [] ED /[] Urgent Care (no appt availability in office) / [x] Appointment(In office/virtual)/ []  [facility]/ [] Home Care/ [] Refused Recommended Disposition /[] [facility]/ []  Follow-up with PCP Additional Notes:  On 03/29/23 she was evaluated and diagnosed with sinus infection, she started doxycycline and prednisone. Completed steroid, has 3 days of doxycycline left. Having some nausea. Calling today because she notices dizziness within couple hours of taking doxycycline. Her cough is worse, no sense of taste or smell, congestion not improved. Acute evaluation scheduled with PCP on 04/06/23. Care advice discussed as documented on protocol.    Copied from CRM [id number]. Topic: Clinical - Red Word Triage >> Apr 05, 2023 10:25 AM Tiffany S wrote: Red Word that prompted transfer to Nurse Triage: Patient having side effects from antibiotics Headaches Increased cough can't get out of bed  dizziness and nausea Reason for Disposition  [1] Taking antibiotic > 72 hours (3 days) AND [2] sinus pain not improved  Protocols used: Sinus Infection on Antibiotic Follow-up Call-[facility]

## 2023-04-06 ENCOUNTER — Ambulatory Visit (INDEPENDENT_AMBULATORY_CARE_PROVIDER_SITE_OTHER): Admitting: Family Medicine

## 2023-04-06 ENCOUNTER — Encounter: Payer: Self-pay | Admitting: Family Medicine

## 2023-04-06 VITALS — BP 120/72 | HR 75 | Temp 97.0°F | Ht 62.0 in | Wt 160.6 lb

## 2023-04-06 DIAGNOSIS — E785 Hyperlipidemia, unspecified: Secondary | ICD-10-CM | POA: Diagnosis not present

## 2023-04-06 DIAGNOSIS — I10 Essential (primary) hypertension: Secondary | ICD-10-CM

## 2023-04-06 DIAGNOSIS — B9689 Other specified bacterial agents as the cause of diseases classified elsewhere: Secondary | ICD-10-CM

## 2023-04-06 DIAGNOSIS — J329 Chronic sinusitis, unspecified: Secondary | ICD-10-CM

## 2023-04-06 MED ORDER — AMOXICILLIN-POT CLAVULANATE 875-125 MG PO TABS: 1.0000 | ORAL_TABLET | Freq: Two times a day (BID) | ORAL | 0 refills | Status: AC

## 2023-04-06 NOTE — Patient Instructions (Addendum)
 this appears to be resistant sinusitis and has failed treatment with doxycycline so we will advance to 10 days of Augmentin (was avoided last visit due to potential stomach sensitivity but we looked back to January 2024 and she seemed to tolerate). We considered another course of prednisone but wanted to wait and see if she can make turnaround on antibiotic(s) alone- if not significantly  better within 3-7 days may send in prednisone as well   Recommended follow up: Return for as needed for new, worsening, persistent symptoms.

## 2023-04-06 NOTE — Progress Notes (Signed)
 Phone [phone] In person visit   Subjective:   Dana Dillon is a 68 y.o. year old very pleasant female patient who presents for/with See problem oriented charting Chief Complaint  Patient presents with   Sinusitis    Pt states has had sinus issues since last week she states she is not getting any better cough nasal congestion no taste or smell covid test was negative last week.    Past Medical History-  Patient Active Problem List   Diagnosis Date Noted   Hyperlipidemia 07/10/2020    Priority: High   Osteopenia of right femoral neck 07/21/2021    Priority: Medium    Aortic atherosclerosis (HCC) 07/08/2021    Priority: Medium    Anxiety 05/12/2018    Priority: Medium    Hypothyroidism 05/12/2018    Priority: Medium    Hypertension 11/13/2005    Priority: Medium    Nuclear sclerotic cataract of both eyes 06/05/2021    Priority: Low   Posterior vitreous detachment of right eye 06/05/2021    Priority: Low   Loud snoring 05/12/2021    Priority: Low   Nasal deviation 05/12/2021    Priority: Low   Cystoid macular edema of right eye 03/04/2021    Priority: Low   Exudative retinopathy, 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 20 tablet 0   Cholecalciferol (VITAMIN D3 PO) Take 5,000 Units by mouth daily.     cyclobenzaprine (FLEXERIL) 5 MG tablet Take 1 tablet (5 mg total) by mouth at bedtime. 30 tablet 0   levothyroxine (SYNTHROID) 50 MCG tablet TAKE 1 TABLET BY  MOUTH IN THE  MORNING 100 tablet 2   losartan-hydrochlorothiazide (HYZAAR) 100-25 MG tablet Take 1 tablet by mouth daily.     MAGNESIUM CITRATE PO Take by mouth.     potassium chloride (KLOR-CON M) 10 MEQ tablet Take 1 tablet (10 mEq total) by mouth daily. 90 tablet 3   rosuvastatin (CRESTOR) 10 MG tablet Take 1 tablet (10 mg total) by mouth 3 (three) times a week. Take 1 tablet three times a week 70 tablet 1   No current facility-administered medications for this visit.     Objective:  BP 120/72   Pulse 75   Temp (!) 97 F (36.1 C) (Temporal)   Ht 5\' 2"  (1.575 m)   Wt 160 lb 9.6 oz (72.8 kg)   SpO2 98%   BMI 29.37 kg/m  Gen: NAD, resting comfortably Nasal turbinates edematous and erythematous with white and yellow discharge, tympanic membrane's normal bilaterally, pharynx with signs of drainage-sinus is not tender to palpation today CV: RRR no murmurs rubs or gallops Lungs: CTAB no crackles, wheeze, rhonchi Ext: no edema Skin: warm, dry     Assessment and Plan   # Social update-will be grandparents in July!  As noted in last visit  # Resistant bacterial sinusitis S: She was seen on 03/29/23 and started on doxycycline 100 mg twice  daily. Prednisone has helped breathing some. Still feels impacted in sinuses- pressure and congestion. Prior dental pain better. Sore throat from cough but nto as severe. COVID, flu, strep negative.  -symptoms at that point had already been ongoing for 2 weeks. Also had some wheezing previously- seems to have improved.  - also has allergies and still on Flonase, claritin- had been taking daily even before this all started- Sunday had fair amount of dizziness and headaches- that is better.  - No fever chills - No shortness of breath  A/P: this appears to be resistant sinusitis and has failed treatment with doxycycline so we will advance to 10 days of Augmentin (was avoided last visit due to potential stomach sensitivity but we looked back to January 2024  and she seemed to tolerate). We considered another course of prednisone but wanted to wait and see if she can make turnaround on antibiotic(s) alone- if not significantly  better within 3-7 days may send in prednisone as well  -Certainly let us know if other new or worsening symptoms  #hypertension S: medication: losartan Hydrochlorothiazide 100-25 mg but hypokalemia also started on potassium  -Previous trial 100-12.5 mg daily to see if help with potassium (lowers risk of low potassium but had more fluctuating blood pressure)  Home readings #s: Much improved   BP Readings from Last 3 Encounters:  04/06/23 120/72  03/29/23 132/80  03/26/23 132/84  A/P: Blood pressure with improved control back olmesartan hydrochlorothiazide.  She has not yet started her potassium-wants to wait to see how she does through this illness   #hyperlipidemia-in 2023 79th percentile CT cardiac scoringat 94.7 #Aortic atherosclerosis -lpa ELEVATED S: Medication:rosuvastatin 3 days a week in the past but paused due to some muscle aches-plans to restart and build up slowly -Not able to get into cardiology for several months  A/P: Hyperlipidemia with elevated coronary artery calcium scoring and aortic atherosclerosis and new finding of lipoprotein a elevation-she is trying to get into the lipid clinic with cardiology but in the meantime plans to restart her rosuvastatin-I agree with this plan  Recommended follow up: Return for as needed for new, worsening, persistent symptoms. Future Appointments  Date Time Provider Department Center  04/14/2023 10:15 AM Sedalia Muta, PT [facility] None  04/16/2023 10:15 AM Sedalia Muta, PT [facility] None  04/20/2023 11:20 AM [facility] ANNUAL WELLNESS VISIT 1 [facility]   Lab/Order associations:   ICD-10-CM   1. Bacterial sinusitis  J32.9    B96.89     2. Primary hypertension  I10     3. Hyperlipidemia, unspecified hyperlipidemia type  E78.5       Meds ordered this encounter   Medications   amoxicillin-clavulanate (AUGMENTIN) 875-125 MG tablet    Sig: Take 1 tablet by mouth 2 (two) times daily.    Dispense:  20 tablet    Refill:  0    Return precautions advised.  Tana Conch, MD

## 2023-04-07 ENCOUNTER — Telehealth: Payer: Self-pay

## 2023-04-07 ENCOUNTER — Encounter: Admitting: Physical Therapy

## 2023-04-07 NOTE — Telephone Encounter (Signed)
 Copied from CRM [id number]. Topic: General - Other >> Apr 07, 2023 10:55 AM Chantha C wrote: Reason for CRM: Patient received a letter that she's overdue for an appt, patient states has been seeing the orthodontist that made her sleep device for continual care. FYI

## 2023-04-07 NOTE — Telephone Encounter (Signed)
 Typically like to check in once after they have been establish with orthodontist for OSA. She should have a repeat sleep study after she has been fitted for device

## 2023-04-14 ENCOUNTER — Encounter: Admitting: Physical Therapy

## 2023-04-14 ENCOUNTER — Ambulatory Visit: Payer: Self-pay

## 2023-04-14 NOTE — Telephone Encounter (Signed)
 Copied From CRM [id number]. Reason for Triage: ears filled with fluid still after being seen 2 times and placed on rounds of antibiotics  First attempt made; left vm.

## 2023-04-14 NOTE — Telephone Encounter (Signed)
 Cough - improved  Congestion  Bareley taste and smell   Chief Complaint: ear-congestion, cough Symptoms: right ear fullness, clogged, decreased hearing, persistent cough-productive at times, congestion Frequency: 04/06/23 and worsening Pertinent Negatives: Patient denies fever Disposition: [] ED /[] Urgent Care (no appt availability in office) / [] Appointment(In office/virtual)/ []  [facility]/ [] Home Care/ [] Refused Recommended Disposition /[] [facility]/ [x]  Follow-up with PCP Additional Notes: pt she was feeling better & now s/s are getting worse with increased congestion, cough, coughing spells that lead to SOB at times,  now right ear fullness/discomfort.  Pt stated PCP informed if not getting back to inform and will prescribe steroid: therefore pt wanted to update PCP of status.  Reason for Disposition  Cough with cold symptoms (e.g., runny nose, postnasal drip, throat clearing)  Ear congestion  Answer Assessment - Initial Assessment Questions 1. LOCATION: "Which ear is involved?"       Right ear 2. SENSATION: "Describe how the ear feels." (e.g. stuffy, full, plugged)."      Completely full and plugged it  3. ONSET:  "When did the ear symptoms start?"       today 4. PAIN: "Do you also have an earache?" If Yes, ask: "How bad is it?" (Scale 1-10; or mild, moderate, severe)     No- just discomfort 5. CAUSE: "What do you think is causing the ear congestion?"     Sinus  6. URI: "Do you have a runny nose or cough?"      Cough, runny and stuffy nose, chest congestion-productive at times/clear, coughing spells at times which leads to some SOB 7. NASAL ALLERGIES: "Are there symptoms of hay fever, such as sneezing , or a clear nasal discharge?"     Clear nasal drainage 8. PREGNANCY: "Is there any chance you are pregnant?" "When was your last menstrual period?"     N/a  Answer Assessment - Initial Assessment Questions 1. ONSET: "When did the cough begin?"      *No  Answer* 2. SEVERITY: "How bad is the cough today?"      *No Answer* 3. SPUTUM: "Describe the color of your sputum" (none, dry cough; clear, white, yellow, green)     *No Answer* 4. HEMOPTYSIS: "Are you coughing up any blood?" If so ask: "How much?" (flecks, streaks, tablespoons, etc.)     no 5. DIFFICULTY BREATHING: "Are you having difficulty breathing?" If Yes, ask: "How bad is it?" (e.g., mild, moderate, severe)    - MILD: No SOB at rest, mild SOB with walking, speaks normally in sentences, can lie down, no retractions, pulse < 100.    - MODERATE: SOB at rest, SOB with minimal exertion and prefers to sit, cannot lie down flat, speaks in phrases, mild retractions, audible wheezing, pulse 100-120.    - SEVERE: Very SOB at rest, speaks in single words, struggling to breathe, sitting hunched forward, retractions, pulse > 120      *No Answer* 6. FEVER: "Do you have a fever?" If Yes, ask: "What is your temperature, how was it measured, and when did it start?"     no 7. CARDIAC HISTORY: "Do you have any history of heart disease?" (e.g., heart attack, congestive heart failure)      no 8. LUNG HISTORY: "Do you have any history of lung disease?"  (e.g., pulmonary embolus, asthma, emphysema)     no 9. PE RISK FACTORS: "Do you have a history of blood clots?" (or: recent major surgery, recent prolonged travel, bedridden)  N/a 10. OTHER SYMPTOMS: "Do you have any other symptoms?" (e.g., runny nose, wheezing, chest pain)       Some wheezing 11. PREGNANCY: "Is there any chance you are pregnant?" "When was your last menstrual period?"       *No Answer* 12. TRAVEL: "Have you traveled out of the country in the last month?" (e.g., travel history, exposures)       N/a  Protocols used: Ear - Congestion-A-AH, Cough - Acute Productive-A-AH

## 2023-04-15 ENCOUNTER — Telehealth: Payer: Self-pay

## 2023-04-15 MED ORDER — PREDNISONE 20 MG PO TABS: ORAL_TABLET | ORAL | 0 refills | Status: AC

## 2023-04-15 NOTE — Telephone Encounter (Signed)
 There is an error in how this encounter was created as E2 C2 instead of from [place] [facility] [place] and Tammy can you please let me E2C2  know this-when I tried to order in the encounter the prednisone it says provider not available and will not let me send in medication.  This has happened before and we have provided feedback but looks like we need to again  Keba-can you open a new encounter under our office and just note patient needs prednisone refill

## 2023-04-15 NOTE — Addendum Note (Signed)
 Addended by: Shelva Majestic on: 04/15/2023 01:04 PM   Modules accepted: Orders

## 2023-04-15 NOTE — Telephone Encounter (Signed)
 Pt needs prednisone filled.

## 2023-04-15 NOTE — Telephone Encounter (Signed)
 sent

## 2023-04-15 NOTE — Telephone Encounter (Signed)
 See below

## 2023-04-16 ENCOUNTER — Encounter: Payer: Self-pay | Admitting: Physical Therapy

## 2023-04-16 ENCOUNTER — Ambulatory Visit: Admitting: Physical Therapy

## 2023-04-16 DIAGNOSIS — M5459 Other low back pain: Secondary | ICD-10-CM

## 2023-04-16 DIAGNOSIS — M25552 Pain in left hip: Secondary | ICD-10-CM

## 2023-04-16 DIAGNOSIS — M25572 Pain in left ankle and joints of left foot: Secondary | ICD-10-CM | POA: Diagnosis not present

## 2023-04-16 DIAGNOSIS — M25551 Pain in right hip: Secondary | ICD-10-CM

## 2023-04-16 NOTE — Therapy (Signed)
 OUTPATIENT PHYSICAL THERAPY LOWER EXTREMITY TREATMENT   Patient Name: Dana Dillon MRN: [id number] DOB:[birth year], 68 y.o., female Today's Date: 04/16/2023   END OF SESSION:  PT End of Session - 04/16/23 1018     Visit Number 5    Number of Visits 16    Date for PT Re-Evaluation 04/27/23    Authorization Type [employer] medicare    PT Start Time 1018    PT Stop Time 1100    PT Time Calculation (min) 42 min    Activity Tolerance Patient tolerated treatment well    Behavior During Therapy WFL for tasks assessed/performed               Past Medical History:  Diagnosis Date   Hot flashes    Hypertension    LYMPHOPENIA 05/04/2007   saw oncology- later normalized. ? virus related   RHINITIS 12/03/2009   Past Surgical History:  Procedure Laterality Date   CARPAL TUNNEL RELEASE     right side. Dr. Garfield Cornea SECTION     x2   Patient Active Problem List   Diagnosis Date Noted   Osteopenia of right femoral neck 07/21/2021   Aortic atherosclerosis (HCC) 07/08/2021   Nuclear sclerotic cataract of both eyes 06/05/2021   Posterior vitreous detachment of right eye 06/05/2021   Loud snoring 05/12/2021   Nasal deviation 05/12/2021   Cystoid macular edema of right eye 03/04/2021   Exudative retinopathy, right eye 03/04/2021   Right epiretinal membrane 03/04/2021   Hyperlipidemia 07/10/2020   Anxiety 05/12/2018   Hypothyroidism 05/12/2018   Plantar fasciitis of left foot 05/14/2017   Caregiver burden 12/11/2016   Former smoker 12/28/2015   Hot flash, menopausal 06/29/2014   Allergic rhinitis 12/03/2009   Hypertension 11/13/2005    PCP: Tana Conch  REFERRING PROVIDER: Aris [place]  REFERRING DIAG: myalgia, back pain, bil hip pain, L ankle pain   THERAPY DIAG:  Other low back pain  Pain in left ankle and joints of left foot  Pain of both hip joints  Rationale for Evaluation and Treatment: Rehabilitation  ONSET DATE:    SUBJECTIVE:   SUBJECTIVE  STATEMENT: Pt states doing much better with back, hip and ankle pain. She has been quite sick with sinus infection, and has not been doing as much activity. She has been taking potassium and thinks it has helped joint pain significantly.   Eval: Pt states pain in multiple areas. Reports increased L  Ankle pain  a couple months ago, after she was having pain in R hamstring. Leg pain better now, but ankle still painful/worsening. Pain started at medial high ankle, continued pain below medial malleolus.  States Back pain low back pain into bil hips, into anterior hips, worse on L side. Worse with sleeping/ having trouble sleeping on her sides. Pain with initial standing- shooting into bil hips.  Has stopped taking statin,  and took potassium, feeling a bit better, was feeling very achey all over.   Shoulder doing well, did have previous pain there.  Previous R side HS strain, now feeling fine with that.   PERTINENT HISTORY: none   PAIN:  Are you having pain? Yes: NPRS scale: up to 0-2/10 with sleeping  Pain location: bil hips Pain description: sore Aggravating factors: sleeping on her sides  Relieving factors: none   Are you having pain? Yes: NPRS scale: 0-2/10 Pain location: ankle Pain description: pain, constant Aggravating factors: weight bearing  Relieving factors: stretching her back  Are you  having pain? Yes: NPRS scale: 0-2/10 Pain location: back Pain description: dull ache on and off, tight  Aggravating factors: muscle relaxer Relieving factors: none stated    PRECAUTIONS: None  WEIGHT BEARING RESTRICTIONS: No  FALLS:  Has patient fallen in last 6 months? No   PLOF: Independent  PATIENT GOALS:  Decreased pain in foot, hips, back   NEXT MD VISIT:   OBJECTIVE:   DIAGNOSTIC FINDINGS:   PATIENT SURVEYS:   COGNITION: Overall cognitive status: Within functional limits for tasks assessed     SENSATION: WFL  EDEMA:   POSTURE:    foot: with shoes on:  overcorrection into supination  PALPATION: Tenderness at bil gr troch, minimal soreness in low back/SI with palpation;  Minimal/no tenderness at medial ankle with palpation.   LOWER EXTREMITY ROM: Hips: WFL Back: WFL Ankle: wfl, mild limitation for DF bil;   LOWER EXTREMITY MMT:  MMT Left eval Right  eval Left 04/16/23 Right 04/16/23  Hip flexion 4- 4- 4 4  Hip extension      Hip abduction 4 4 4 4   Hip adduction      Hip internal rotation      Hip external rotation      Knee flexion 5 5    Knee extension 5 5    Ankle dorsiflexion      Ankle plantarflexion      Ankle inversion      Ankle eversion       (Blank rows = not tested)  LOWER EXTREMITY SPECIAL TESTS:  Increased pain at medial ankle, below malleolus with standing, weight bearing and walking.    GAIT: Distance walked: 150 ft Assistive device utilized: None Level of assistance: Complete Independence Comments: early heel rise with push off   TODAY'S TREATMENT:                                                                                                                              DATE:   04/16/2023 Therapeutic Exercise: Aerobic: Supine:   bridging 2 x 10;  TA with breathing x 10:  Supine march x 15 with same;    SLR 2 x 10 bil with TA;    S/L:   Seated:  Standing:  hip abd and ext 2 x 10 bil;   Stretches:   LTR x 10;   piriformis 30 sec x 3 supine and seated ;  DKTC 20 sec  3;  pelvic tilts x 10;   Neuromuscular Re-education:  heel raises 2 x 10 cueing for form and foot position for push off;  SLS 10 sec x 3 bil; Slow March x 20;  Manual Therapy: Therapeutic Activity: Self Care:    Therapeutic Exercise: Aerobic: Supine:   bridging 2 x 10;  TA with breathing x 10:  Supine march x 15 with same;   dead bug x 10 ;  SLR 2 x 10 bil with TA;    S/L:   Seated:  Standing:  hip abd and ext 2 x 10 bil;   step up 6 in, x 10 bil for hip strength Stretches:   LTR x 10;   piriformis 30 sec x 3 supine and seated ;   DKTC 20 sec  3;  pelvic tilts x 10;   Neuromuscular Re-education:  heel raises 2 x 10 cueing for form and foot position for push off;  SLS 10 sec x 3 bil; Slow March x 20;  Manual Therapy: Therapeutic Activity: Self Care:   Therapeutic Exercise: Aerobic: Supine:   bridging 2 x 10;  SLR x 10 bil with TA;  Clams GTB with TA x 20;   Prone quad stretch - manual x 3 min on L;  S/L:  hip abd 2 x 10 bil;   Seated: Standing:   Stretches:   LTR x 15;   piriformis 30 sec x 3;  Neuromuscular Re-education:  heel raises 2 x 10 cueing for form and foot position for push off;  Tandem stance 30 sec x 3 bil;   SLS 10 sec x 3 bil;  Manual Therapy: Therapeutic Activity: Self Care:    PATIENT EDUCATION:  Education details: updated and reviewed HEP Person educated: Patient Education method: Explanation, Demonstration, Tactile cues, Verbal cues, and Handouts Education comprehension: verbalized understanding, returned demonstration, verbal cues required, tactile cues required, and needs further education   HOME EXERCISE PROGRAM: Access Code: [email or web address] URL: [email or web address] Date: 03/02/2023 Prepared by: Sedalia Muta  Exercises - Hooklying Single Knee to Chest Stretch  - 2 x daily - 3 reps - 30 hold - Supine Piriformis Stretch Pulling Heel to Hip  - 2 x daily - 3 reps - 30 hold - Supine Lower Trunk Rotation  - 2 x daily - 10 reps - 5 hold - Prone Quadriceps Stretch with Strap  - 1 x daily - 4 reps - 20 hold - Supine Bridge  - 1 x daily - 1-2 sets - 10 reps   ASSESSMENT:  CLINICAL IMPRESSION:  04/16/2023 Pt with good tolerance for strengthening today. No pain with activities performed. Overall progressing well with pain and ability for strengthening. Reviewed and updated HEP today. Discussed importance of continuing HEP. Pt progressing towards goals, will return for 1 more visit, likley d/c at that time.    Eval: Patient presents with primary complaint of  pain in multiple  locations. She has most pain in L ankle. Pain in medial ankle, increased with weight bearing and walking. She would benefit from updated footwear for decreasing overcompensation as in current footwear for more neutral alignment. She has increased pain in low back and into bil hips. She has some symptoms of hip bursitis, but could also be stemming from lumbar spine. Will benefit from back and hip mobility and strength to improve.  It seems ankle issue is separate from back with testing today. Pt with decreased ability for full functional activities. Pt will  benefit from skilled PT to improve deficits and pain and to return to PLOF.   OBJECTIVE IMPAIRMENTS: Abnormal gait, decreased activity tolerance, decreased mobility, decreased strength, increased muscle spasms, and pain.   ACTIVITY LIMITATIONS: lifting, bending, standing, squatting, sleeping, stairs, transfers, and locomotion level  PARTICIPATION LIMITATIONS: meal prep, cleaning, laundry, shopping, and community activity  PERSONAL FACTORS: Time since onset of injury/illness/exacerbation are also affecting patient's functional outcome.   REHAB POTENTIAL: Good  CLINICAL DECISION MAKING: Evolving/moderate complexity  EVALUATION COMPLEXITY: Moderate   GOALS: Goals reviewed with patient? Yes  SHORT TERM GOALS:  Target date: 03/23/2023   Pt to be independent with initial HEP  Goal status: INITIAL  2.  Pt to report decreased pain in back/hips by 50 %   Goal status: INITIAL  3.  Pt to demo ability for weight bearing and walking with 50 % less pain in ankle.   Goal status: INITIAL    LONG TERM GOALS: Target date:  04/27/2023  Pt to be independent with final HEP  Goal status: INITIAL  2.  Pt to report decreased pain in ankle to 0-2/10 with at least 30 min of standing, walking, exercise  Goal status: INITIAL  3.  Pt to report decreased pain to 0-2/10 in hips/back with at least 30 min of standing, walking, exercise.   Goal  status: INITIAL  4.  Pt to be compliant with optimal footwear/orthotics as appropriate for her foot type.   Goal status: INITIAL  PLAN:  PT FREQUENCY: 1-2x/week  PT DURATION: 8 weeks  PLANNED INTERVENTIONS: Therapeutic exercises, Therapeutic activity, Neuromuscular re-education, Patient/Family education, Self Care, Joint mobilization, Joint manipulation, Stair training, Orthotic/Fit training, DME instructions, Aquatic Therapy, Dry Needling, Electrical stimulation, Cryotherapy, Moist heat, Taping, Ultrasound, Ionotophoresis 4mg /ml Dexamethasone, Manual therapy,  Vasopneumatic device, Traction, Spinal manipulation, Spinal mobilization,Balance training, Gait training,   PLAN FOR NEXT SESSION: bike, hip strength, back and hip mobility, L ankle strength , foot position.    Sedalia Muta, PT, DPT 10:18 AM  04/16/23

## 2023-04-20 ENCOUNTER — Ambulatory Visit: Payer: Medicare Other

## 2023-05-05 ENCOUNTER — Encounter: Admitting: Physical Therapy

## 2023-05-07 ENCOUNTER — Encounter: Payer: Self-pay | Admitting: Physical Therapy

## 2023-05-07 ENCOUNTER — Ambulatory Visit: Admitting: Physical Therapy

## 2023-05-07 DIAGNOSIS — M5459 Other low back pain: Secondary | ICD-10-CM

## 2023-05-07 DIAGNOSIS — M25552 Pain in left hip: Secondary | ICD-10-CM

## 2023-05-07 DIAGNOSIS — M25572 Pain in left ankle and joints of left foot: Secondary | ICD-10-CM | POA: Diagnosis not present

## 2023-05-07 DIAGNOSIS — M25551 Pain in right hip: Secondary | ICD-10-CM | POA: Diagnosis not present

## 2023-05-07 NOTE — Therapy (Signed)
 OUTPATIENT PHYSICAL THERAPY LOWER EXTREMITY TREATMENT/Re-Cert/ D/C   Patient Name: Dana Dillon MRN: [id number] DOB:October 07, 1955, 68 y.o., female Today's Date: 2/95/2841  Re-Cert today for todays date/extension only.   END OF SESSION:  PT End of Session - 05/07/23 0929     Visit Number 6    Number of Visits 16    Date for PT Re-Evaluation 05/07/23    Authorization Type [employer] medicare    PT Start Time 0930    PT Stop Time 1015    PT Time Calculation (min) 45 min    Activity Tolerance Patient tolerated treatment well    Behavior During Therapy WFL for tasks assessed/performed               Past Medical History:  Diagnosis Date   Hot flashes    Hypertension    LYMPHOPENIA 05/04/2007   saw oncology- later normalized. ? virus related   RHINITIS 12/03/2009   Past Surgical History:  Procedure Laterality Date   CARPAL TUNNEL RELEASE     right side. Dr. Suan Elm SECTION     x2   Patient Active Problem List   Diagnosis Date Noted   Osteopenia of right femoral neck 07/21/2021   Aortic atherosclerosis (HCC) 07/08/2021   Nuclear sclerotic cataract of both eyes 06/05/2021   Posterior vitreous detachment of right eye 06/05/2021   Loud snoring 05/12/2021   Nasal deviation 05/12/2021   Cystoid macular edema of right eye 03/04/2021   Exudative retinopathy, right eye 03/04/2021   Right epiretinal membrane 03/04/2021   Hyperlipidemia 07/10/2020   Anxiety 05/12/2018   Hypothyroidism 05/12/2018   Plantar fasciitis of left foot 05/14/2017   Caregiver burden 12/11/2016   Former smoker 12/28/2015   Hot flash, menopausal 06/29/2014   Allergic rhinitis 12/03/2009   Hypertension 11/13/2005    PCP: Clarisa Crooked  REFERRING PROVIDER: Cleora Daft  REFERRING DIAG: myalgia, back pain, bil hip pain, L ankle pain   THERAPY DIAG:  Other low back pain  Pain in left ankle and joints of left foot  Pain of both hip joints  Rationale for Evaluation and Treatment:  Rehabilitation  ONSET DATE:    SUBJECTIVE:   SUBJECTIVE STATEMENT: Pt states doing very well. No pain in back, hips, or feet. Has been able to resume exercise.   Eval: Pt states pain in multiple areas. Reports increased L  Ankle pain  a couple months ago, after she was having pain in R hamstring. Leg pain better now, but ankle still painful/worsening. Pain started at medial high ankle, continued pain below medial malleolus.  States Back pain low back pain into bil hips, into anterior hips, worse on L side. Worse with sleeping/ having trouble sleeping on her sides. Pain with initial standing- shooting into bil hips.  Has stopped taking statin,  and took potassium, feeling a bit better, was feeling very achey all over.   Shoulder doing well, did have previous pain there.  Previous R side HS strain, now feeling fine with that.   PERTINENT HISTORY: none   PAIN:  Are you having pain? Yes: NPRS scale: up to 0/10 Pain location: bil hips Pain description: sore Aggravating factors: sleeping on her sides  Relieving factors: none   Are you having pain? Yes: NPRS scale: 0/10 Pain location: ankle Pain description: pain, constant Aggravating factors: weight bearing  Relieving factors: stretching her back  Are you having pain? Yes: NPRS scale: 0/10 Pain location: back Pain description: dull ache on and off, tight  Aggravating factors: muscle relaxer Relieving factors: none stated    PRECAUTIONS: None  WEIGHT BEARING RESTRICTIONS: No  FALLS:  Has patient fallen in last 6 months? No   PLOF: Independent  PATIENT GOALS:  Decreased pain in foot, hips, back   NEXT MD VISIT:   OBJECTIVE: updated 05/07/23  DIAGNOSTIC FINDINGS:   PATIENT SURVEYS:   COGNITION: Overall cognitive status: Within functional limits for tasks assessed     SENSATION: WFL  EDEMA:   POSTURE:    foot: with shoes on: overcorrection into supination  PALPATION:   LOWER EXTREMITY ROM: Hips:  WFL Back: WFL Ankle: wfl, mild limitation for DF bil;   LOWER EXTREMITY MMT:  MMT Left eval Right  eval Left 05/07/23 Right 05/07/23  Hip flexion 4- 4- 5 5  Hip extension      Hip abduction 4 4 4+ 4+  Hip adduction      Hip internal rotation      Hip external rotation      Knee flexion 5 5 5 5   Knee extension 5 5 5 5   Ankle dorsiflexion      Ankle plantarflexion      Ankle inversion      Ankle eversion       (Blank rows = not tested)  LOWER EXTREMITY SPECIAL TESTS:     GAIT:   TODAY'S TREATMENT:                                                                                                                              DATE:   05/07/2023 Therapeutic Exercise: Aerobic:  bike L 1 x 7 min;  Supine:   bridging 2 x 10;   Supine march x 15 with same;    SLR 2 x 10 bil with TA;    Planks: elbows/knees 20 sec x 3;  feet/elbows 8 sec x 1;  S/L:   Seated:  sit to stand with education on TA for strength x 10;  Standing:  hip abd  2 x 10 bil;   Stretches:   Neuromuscular Re-education:  heel raises 2 x 10 cueing for form and foot position for push off;  SLS 20 sec x 3 bil; Slow March x 20;   Manual Therapy: Therapeutic Activity: Self Care:   PATIENT EDUCATION:  Education details: updated and reviewed HEP Person educated: Patient Education method: Explanation, Demonstration, Tactile cues, Verbal cues, and Handouts Education comprehension: verbalized understanding, returned demonstration, verbal cues required, tactile cues required, and needs further education   HOME EXERCISE PROGRAM: Access Code: [email or web address] URL: [email or web address] Date: 03/02/2023 Prepared by: Terrilee Few  Exercises - Hooklying Single Knee to Chest Stretch  - 2 x daily - 3 reps - 30 hold - Supine Piriformis Stretch Pulling Heel to Hip  - 2 x daily - 3 reps - 30 hold - Supine Lower Trunk Rotation  - 2 x daily - 10 reps - 5 hold - Prone Quadriceps Stretch with Strap  -  1 x daily - 4  reps - 20 hold - Supine Bridge  - 1 x daily - 1-2 sets - 10 reps   ASSESSMENT:  CLINICAL IMPRESSION:  05/07/2023 Pt has made good progress. She has been pain free, and has much improved strength and ability for HEP. She has been able to progress strengthening without pain and return to more exercise at home. Updated and reviewed HEP today with focus on foot/ankle stability, and hip/core strength. Pt has met goals at this time, and is ready for d/c to HEP. Re-cert for todays date extension only. Goals and strength updated today.   Eval: Patient presents with primary complaint of  pain in multiple locations. She has most pain in L ankle. Pain in medial ankle, increased with weight bearing and walking. She would benefit from updated footwear for decreasing overcompensation as in current footwear for more neutral alignment. She has increased pain in low back and into bil hips. She has some symptoms of hip bursitis, but could also be stemming from lumbar spine. Will benefit from back and hip mobility and strength to improve.  It seems ankle issue is separate from back with testing today. Pt with decreased ability for full functional activities. Pt will  benefit from skilled PT to improve deficits and pain and to return to PLOF.   OBJECTIVE IMPAIRMENTS: Abnormal gait, decreased activity tolerance, decreased mobility, decreased strength, increased muscle spasms, and pain.   ACTIVITY LIMITATIONS: lifting, bending, standing, squatting, sleeping, stairs, transfers, and locomotion level  PARTICIPATION LIMITATIONS: meal prep, cleaning, laundry, shopping, and community activity  PERSONAL FACTORS: Time since onset of injury/illness/exacerbation are also affecting patient's functional outcome.   REHAB POTENTIAL: Good  CLINICAL DECISION MAKING: Evolving/moderate complexity  EVALUATION COMPLEXITY: Moderate   GOALS: Goals reviewed with patient? Yes  SHORT TERM GOALS: Target date: 03/23/2023   Pt to be  independent with initial HEP  Goal status: MET  2.  Pt to report decreased pain in back/hips by 50 %   Goal status: MET  3.  Pt to demo ability for weight bearing and walking with 50 % less pain in ankle.   Goal status: MET    LONG TERM GOALS: Target date:  05/07/2023  Pt to be independent with final HEP  Goal status: MET  2.  Pt to report decreased pain in ankle to 0-2/10 with at least 30 min of standing, walking, exercise  Goal status: MET  3.  Pt to report decreased pain to 0-2/10 in hips/back with at least 30 min of standing, walking, exercise.   Goal status: MET  4.  Pt to be compliant with optimal footwear/orthotics as appropriate for her foot type.   Goal status: MET  PLAN:  PT FREQUENCY: 1-2x/week  PT DURATION: 8 weeks  PLANNED INTERVENTIONS: Therapeutic exercises, Therapeutic activity, Neuromuscular re-education, Patient/Family education, Self Care, Joint mobilization, Joint manipulation, Stair training, Orthotic/Fit training, DME instructions, Aquatic Therapy, Dry Needling, Electrical stimulation, Cryotherapy, Moist heat, Taping, Ultrasound, Ionotophoresis 4mg /ml Dexamethasone, Manual therapy,  Vasopneumatic device, Traction, Spinal manipulation, Spinal mobilization,Balance training, Gait training,   PLAN FOR NEXT SESSION:    Terrilee Few, PT, DPT 9:29 AM  05/07/23   PHYSICAL THERAPY DISCHARGE SUMMARY  Visits from Start of Care: 6  Plan: Patient agrees to discharge.  Patient goals were met. Patient is being discharged due to meeting the stated rehab goals.     Terrilee Few, PT, DPT 10:24 AM  05/07/23

## 2023-05-14 IMAGING — US US BREAST*R* LIMITED INC AXILLA
1 series · 11 of 11 positions shown · non-contrast
Comparison: Previous exam(s).

CLINICAL DATA: Patient was called back for a right breast mass

EXAM:
DIGITAL DIAGNOSTIC UNILATERAL RIGHT MAMMOGRAM WITH TOMOSYNTHESIS AND
CAD; ULTRASOUND RIGHT BREAST LIMITED
TECHNIQUE: Right digital diagnostic mammography and breast tomosynthesis was
performed. The images were evaluated with computer-aided detection.;
Targeted ultrasound examination of the right breast was performed

[Series 1: us breast*right* limited inc axilla · 0.06mm/px · 11 of 11 slices shown]
[im 1/11]
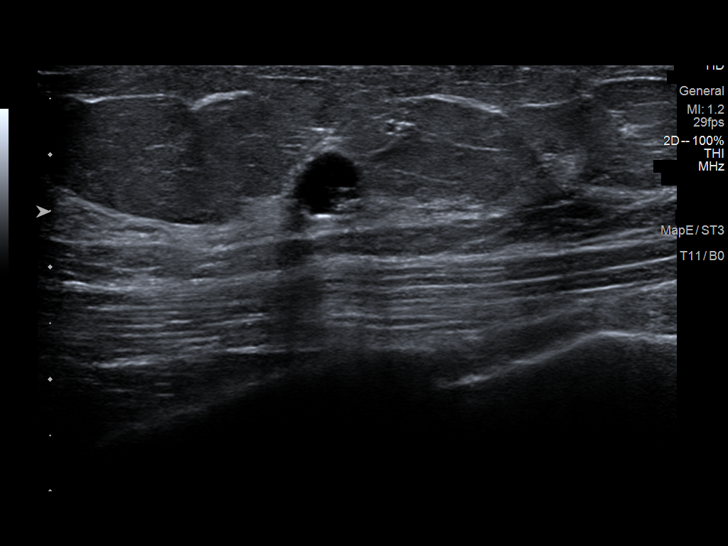
[im 2/11]
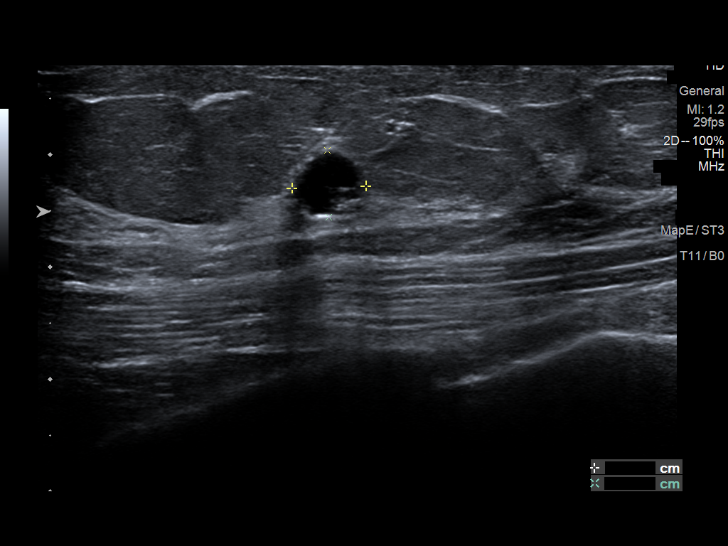
[im 3/11]
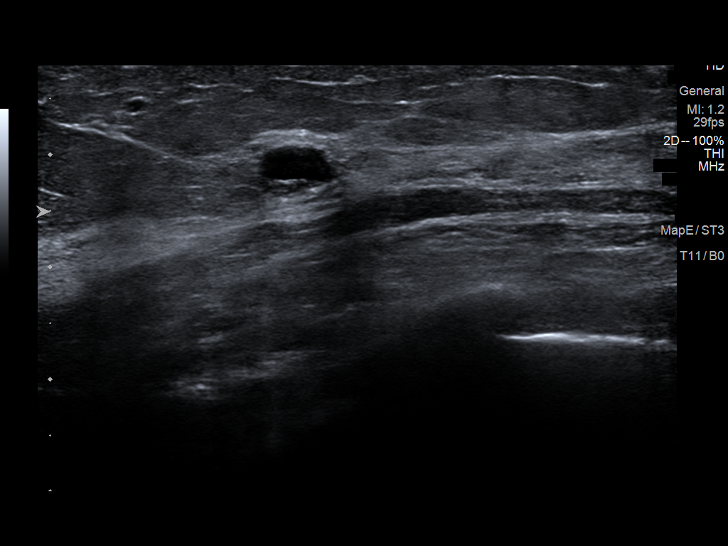
[im 4/11]
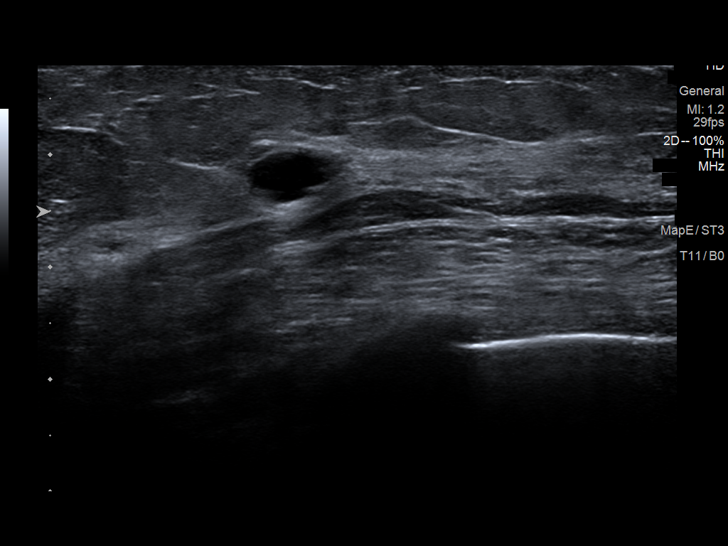
[im 5/11]
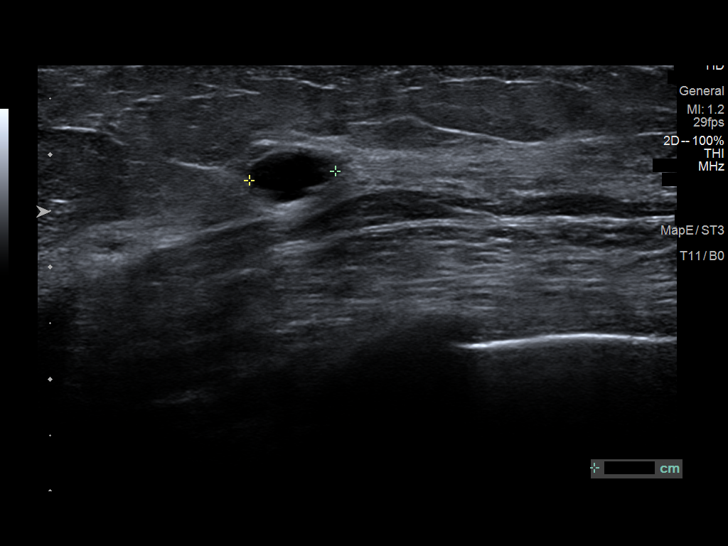
[im 6/11]
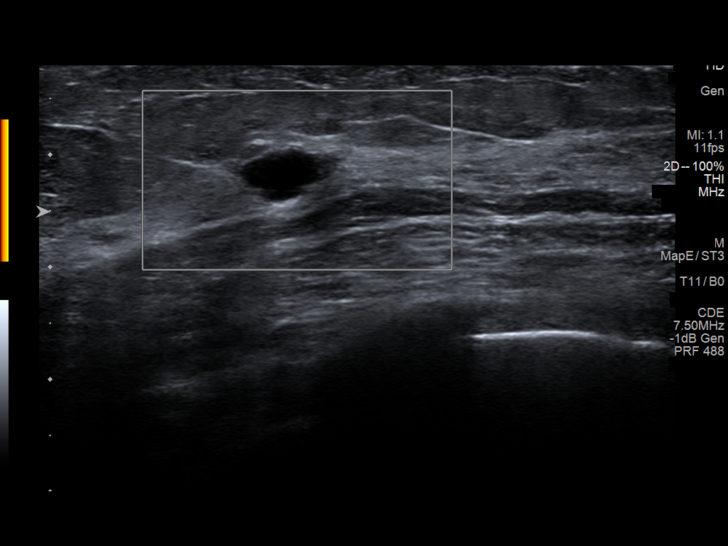
[im 7/11]
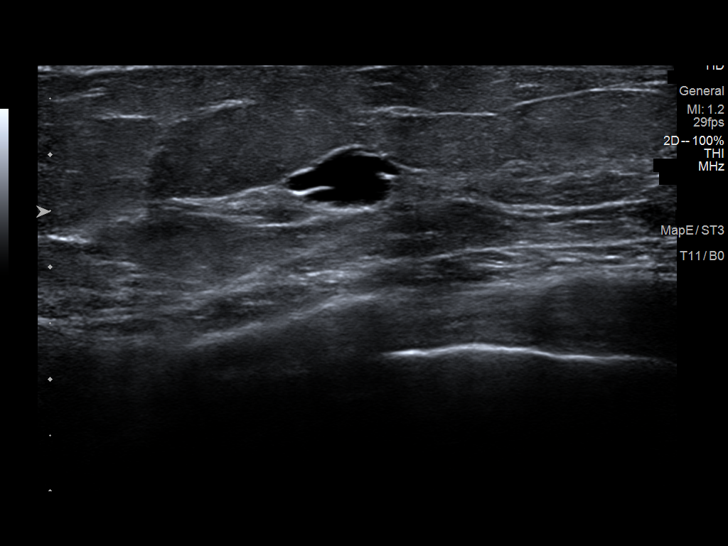
[im 8/11]
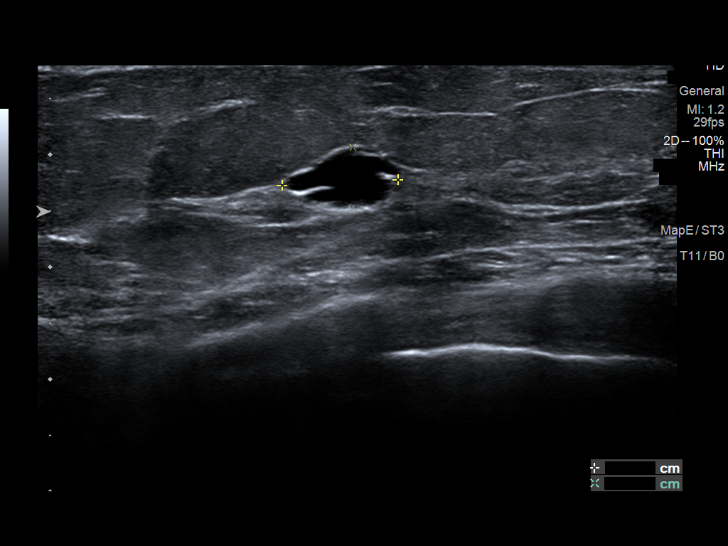
[im 9/11]
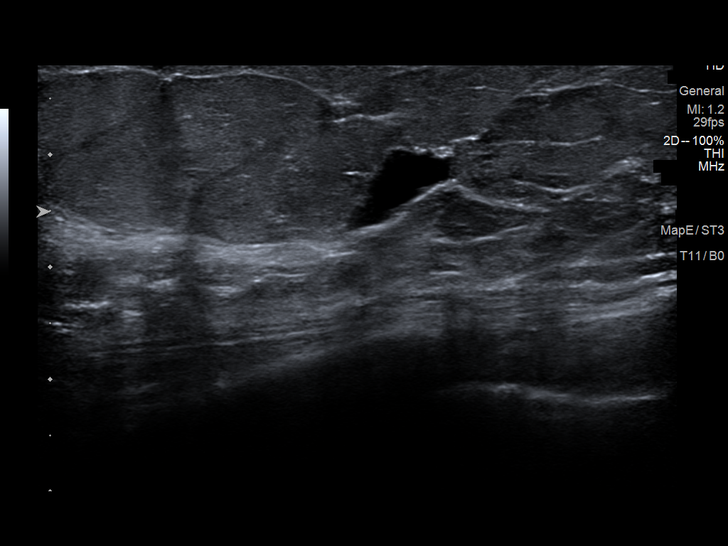
[im 10/11]
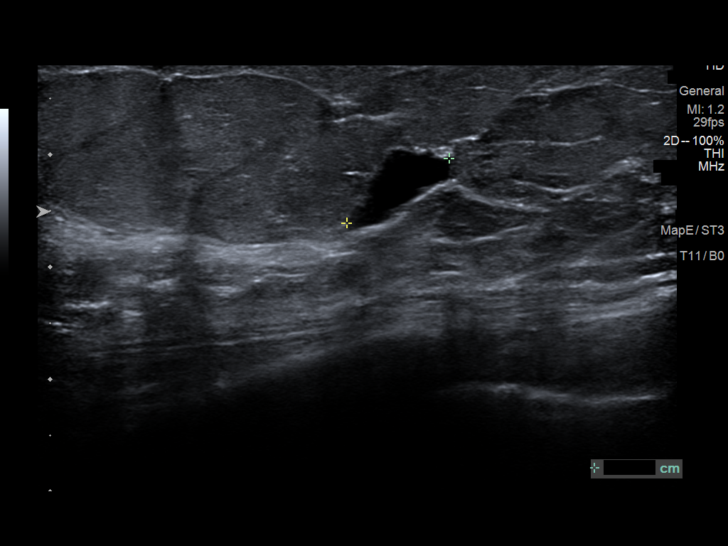
[im 11/11]
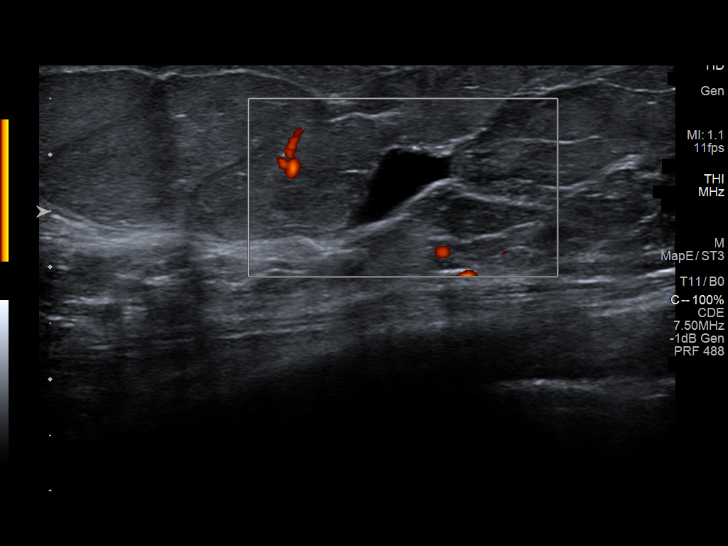

[11 of 11 positions shown; findings below may reference images not displayed]

ACR Breast Density Category b: There are scattered areas of
fibroglandular density.
FINDINGS: There are actually 2 masses in the medial right breast, 1 located
inferiorly and the other located at approximately 2 o'clock.

Targeted ultrasound is performed, showing 2 cysts in the right
breast. The first cyst at 2 o'clock, 4 cm from the nipple measures
up to 8 mm and contains debris but no solid components. The second
cyst is identified at 5 o'clock, 3 cm from the nipple, measuring 10
mm.
IMPRESSION: Fibrocystic changes.  No evidence of malignancy.

RECOMMENDATION:
Annual screening mammography.

I have discussed the findings and recommendations with the patient.
If applicable, a reminder letter will be sent to the patient
regarding the next appointment.

BI-RADS CATEGORY  2: Benign.

## 2023-05-14 IMAGING — MG MM DIGITAL DIAGNOSTIC UNILAT*R* W/ TOMO W/ CAD
4 series · 4 of 12 positions shown · non-contrast
Comparison: Previous exam(s).

CLINICAL DATA: Patient was called back for a right breast mass

EXAM:
DIGITAL DIAGNOSTIC UNILATERAL RIGHT MAMMOGRAM WITH TOMOSYNTHESIS AND
CAD; ULTRASOUND RIGHT BREAST LIMITED
TECHNIQUE: Right digital diagnostic mammography and breast tomosynthesis was
performed. The images were evaluated with computer-aided detection.;
Targeted ultrasound examination of the right breast was performed

[R MLO synth-2D]
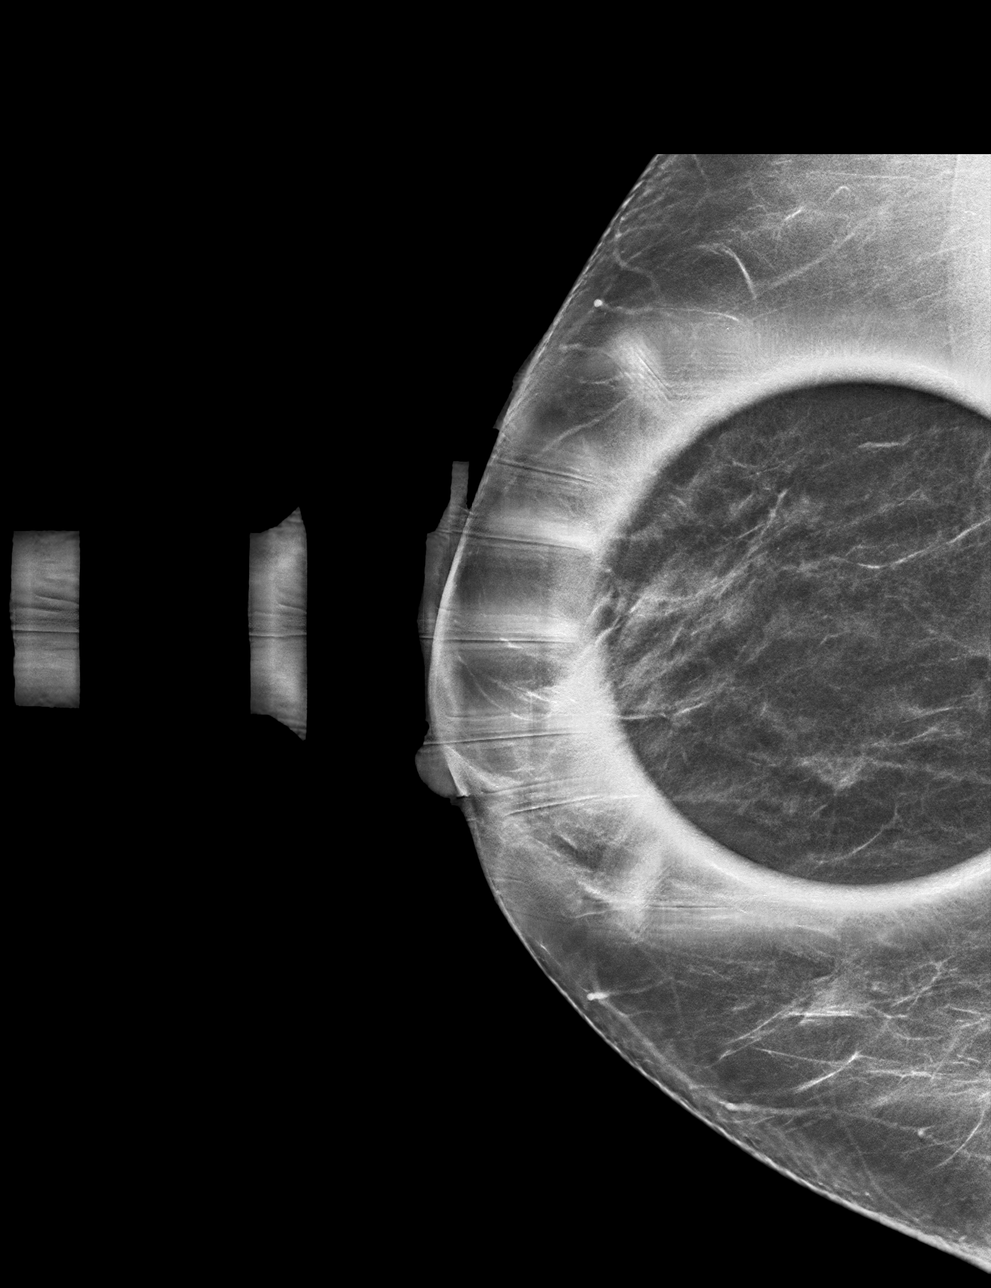

[R CC synth-2D]
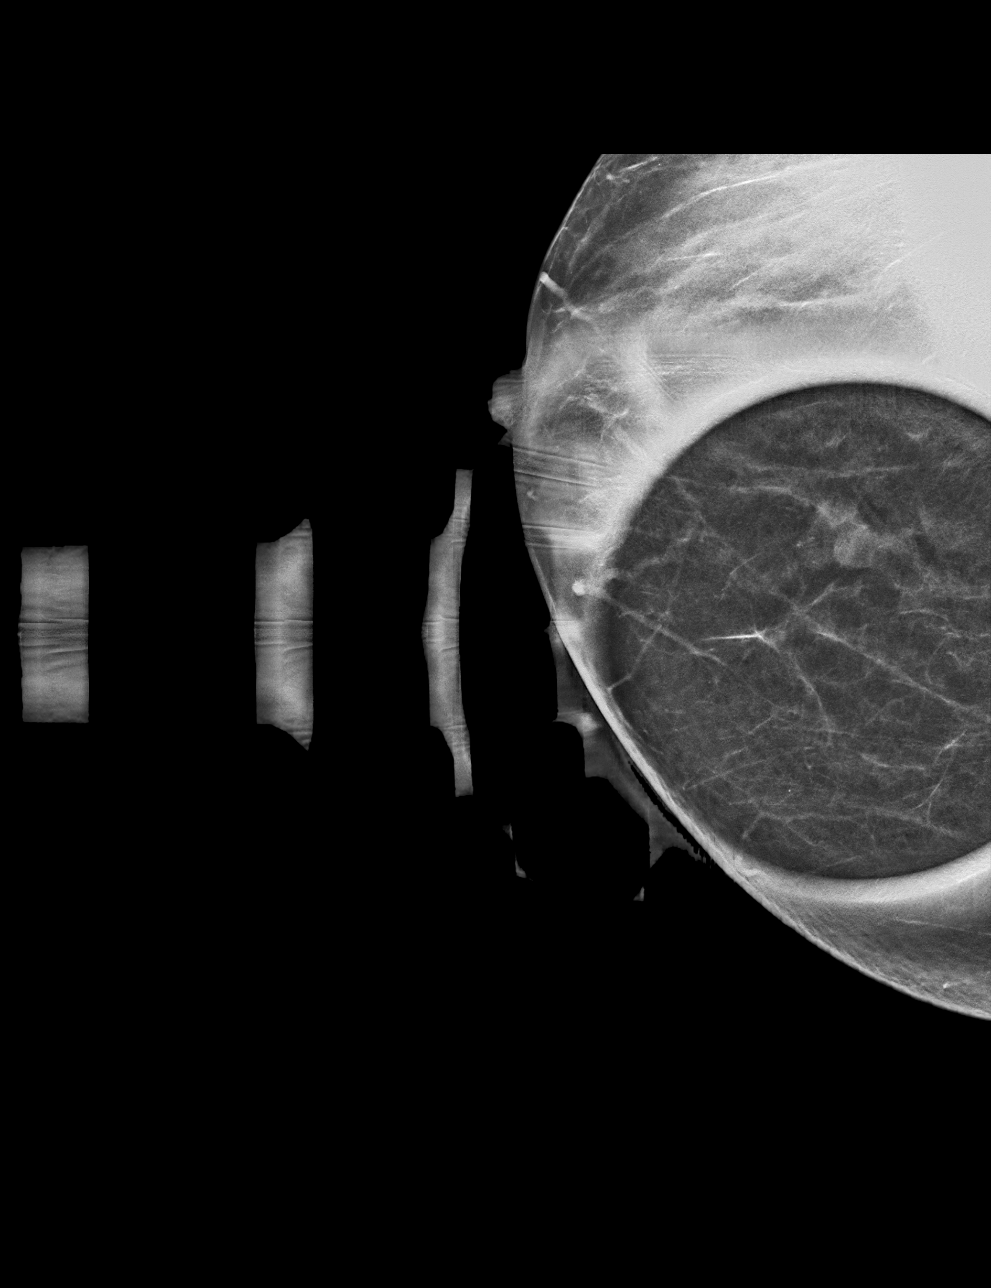

[R MLO tomo · tomo slice 28/55.0]
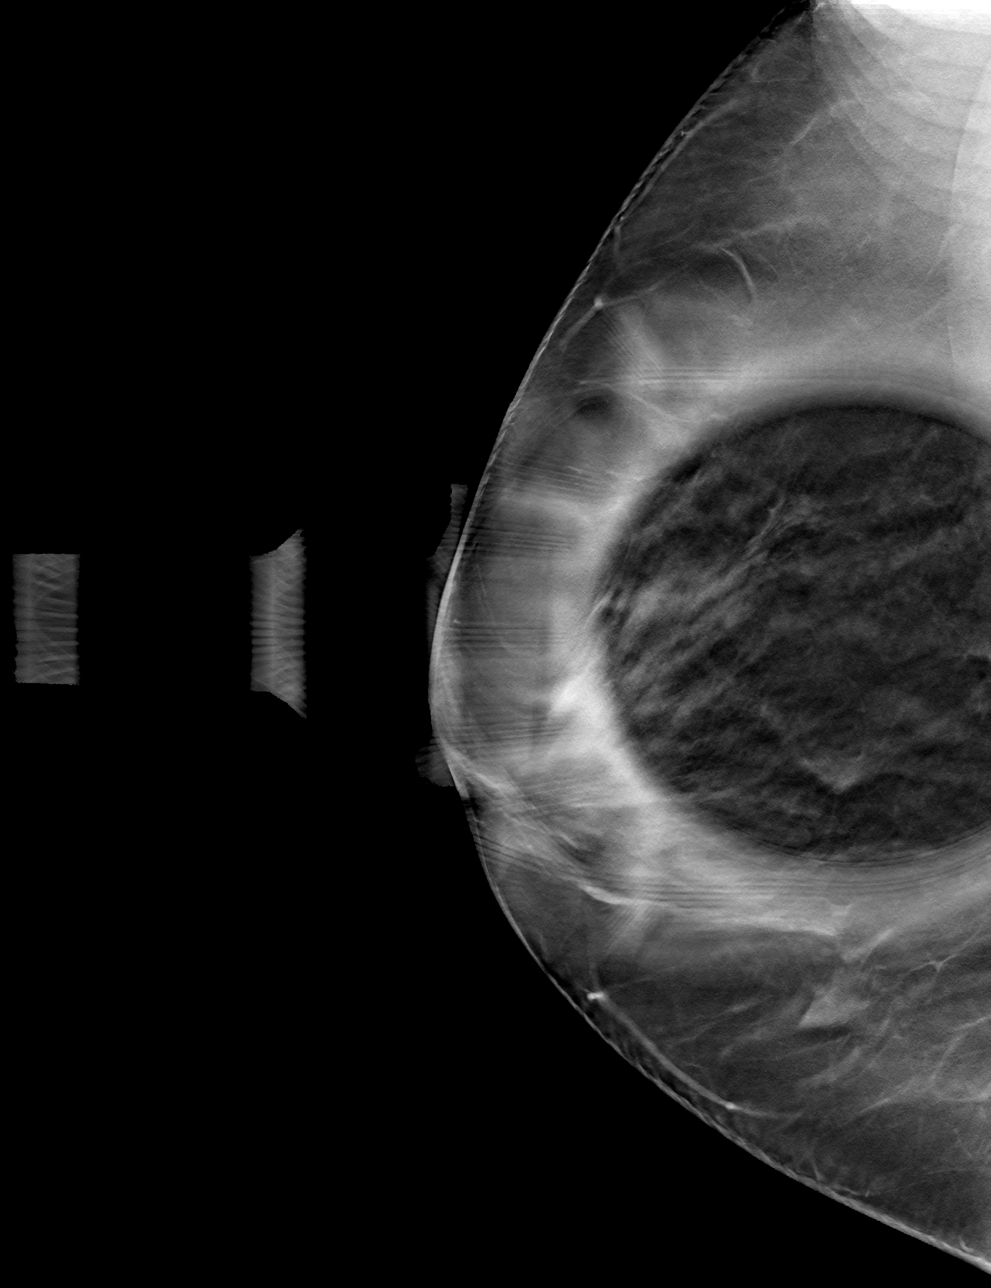

[R CC tomo · tomo slice 25/49.0]
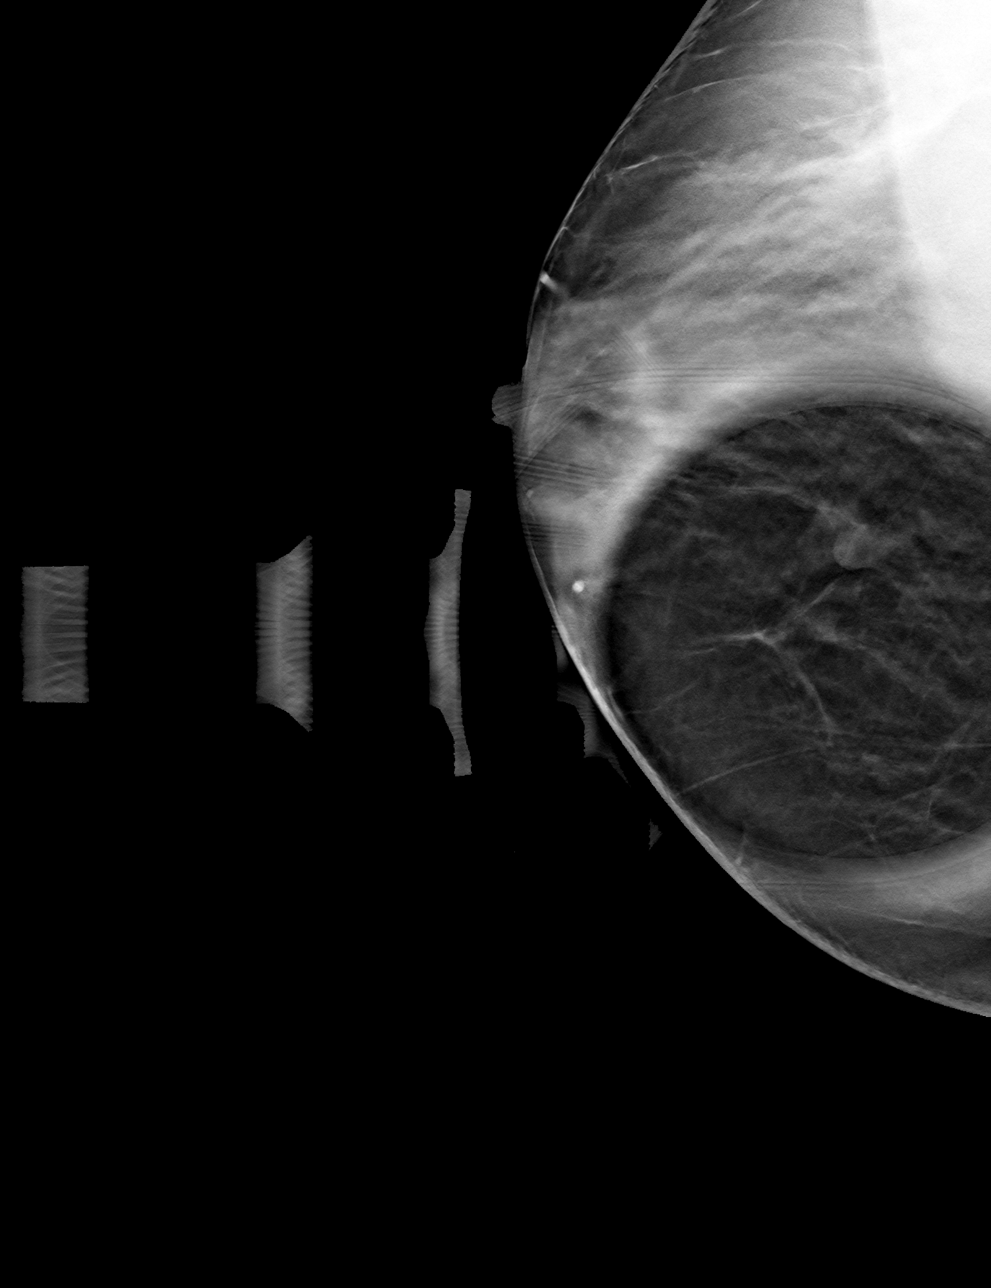

[4 of 12 positions shown; findings below may reference images not displayed]

ACR Breast Density Category b: There are scattered areas of
fibroglandular density.
FINDINGS: There are actually 2 masses in the medial right breast, 1 located
inferiorly and the other located at approximately 2 o'clock.

Targeted ultrasound is performed, showing 2 cysts in the right
breast. The first cyst at 2 o'clock, 4 cm from the nipple measures
up to 8 mm and contains debris but no solid components. The second
cyst is identified at 5 o'clock, 3 cm from the nipple, measuring 10
mm.
IMPRESSION: Fibrocystic changes.  No evidence of malignancy.

RECOMMENDATION:
Annual screening mammography.

I have discussed the findings and recommendations with the patient.
If applicable, a reminder letter will be sent to the patient
regarding the next appointment.

BI-RADS CATEGORY  2: Benign.

## 2023-06-14 ENCOUNTER — Ambulatory Visit (INDEPENDENT_AMBULATORY_CARE_PROVIDER_SITE_OTHER)

## 2023-06-14 VITALS — BP 125/72 | Ht 62.0 in | Wt 160.0 lb

## 2023-06-14 DIAGNOSIS — Z0000 Encounter for general adult medical examination without abnormal findings: Secondary | ICD-10-CM | POA: Diagnosis not present

## 2023-06-14 DIAGNOSIS — Z5320 Procedure and treatment not carried out because of patient's decision for unspecified reasons: Secondary | ICD-10-CM

## 2023-06-14 DIAGNOSIS — Z2821 Immunization not carried out because of patient refusal: Secondary | ICD-10-CM | POA: Diagnosis not present

## 2023-06-14 NOTE — Patient Instructions (Signed)
 Ms. Ballard , Thank you for taking time out of your busy schedule to complete your Annual Wellness Visit with me. I enjoyed our conversation and look forward to speaking with you again next year. I, as well as your care team,  appreciate your ongoing commitment to your health goals. Please review the following plan we discussed and let me know if I can assist you in the future. Your Game plan/ To Do List    Referrals: None Follow up Visits: Next Medicare AWV with our clinical staff: 06/03/20256 1:00pm   Have you seen your provider in the last 6 months (3 months if uncontrolled diabetes)? Yes Next Office Visit with your provider: n/A  Clinician Recommendations:  Aim for 30 minutes of exercise or brisk walking, 6-8 glasses of water, and 5 servings of fruits and vegetables each day.       This is a list of the screening recommended for you and due dates:  Health Maintenance  Topic Date Due   Pneumonia Vaccine (1 of 1 - PCV) Never done   Zoster (Shingles) Vaccine (1 of 2) Never done   COVID-19 Vaccine (5 - 2024-25 season) 09/13/2022   Mammogram  04/30/2023   Flu Shot  08/13/2023   Medicare Annual Wellness Visit  06/13/2024   Colon Cancer Screening  08/21/2025   DTaP/Tdap/Td vaccine (2 - Td or Tdap) 07/15/2026   DEXA scan (bone density measurement)  Completed   Hepatitis C Screening  Completed   HPV Vaccine  Aged Out   Meningitis B Vaccine  Aged Out    Advanced directives: (Declined) Advance directive discussed with you today. Even though you declined this today, please call our office should you change your mind, and we can give you the proper paperwork for you to fill out. Advance Care Planning is important because it:  [x]  Makes sure you receive the medical care that is consistent with your values, goals, and preferences  [x]  It provides guidance to your family and loved ones and reduces their decisional burden about whether or not they are making the right decisions based on your  wishes.  Follow the link provided in your after visit summary or read over the paperwork we have mailed to you to help you started getting your Advance Directives in place. If you need assistance in completing these, please reach out to us  so that we can help you!  See attachments for Preventive Care and Fall Prevention Tips.

## 2023-06-14 NOTE — Progress Notes (Signed)
 Because this visit was a virtual/telehealth visit,  certain criteria was not obtained, such a blood pressure, CBG if applicable, and timed get up and go. Any medications not marked as "taking" were not mentioned during the medication reconciliation part of the visit. Any vitals not documented were not able to be obtained due to this being a telehealth visit or patient was unable to self-report a recent blood pressure reading due to a lack of equipment at home via telehealth. Vitals that have been documented are verbally provided by the patient.  This visit was performed by a medical professional under my direct supervision. I was immediately available for consultation/collaboration. I have reviewed and agree with the Annual Wellness Visit documentation.  Subjective:   Dana Dillon is a 68 y.o. who presents for a Medicare Wellness preventive visit.  As a reminder, Annual Wellness Visits don't include a physical exam, and some assessments may be limited, especially if this visit is performed virtually. We may recommend an in-person follow-up visit with your provider if needed.  Visit Complete: Virtual I connected with  Dana Dillon on 06/14/23 by a audio enabled telemedicine application and verified that I am speaking with the correct person using two identifiers.  Patient Location: Home  Provider Location: Home Office  I discussed the limitations of evaluation and management by telemedicine. The patient expressed understanding and agreed to proceed.  Vital Signs: Because this visit was a virtual/telehealth visit, some criteria may be missing or patient reported. Any vitals not documented were not able to be obtained and vitals that have been documented are patient reported.  VideoDeclined- This patient declined Librarian, academic. Therefore the visit was completed with audio only.  Persons Participating in Visit: Patient.  AWV Questionnaire: No: Patient Medicare  AWV questionnaire was not completed prior to this visit.  Cardiac Risk Factors include: advanced age (>40men, >69 women);hypertension;dyslipidemia     Objective:     Today's Vitals   06/14/23 1350  BP: 125/72  Weight: 160 lb (72.6 kg)  Height: 5\' 2"  (1.575 m)   Body mass index is 29.26 kg/m.     06/14/2023    1:49 PM 03/03/2023   10:22 AM 09/30/2022    9:31 AM  Advanced Directives  Does Patient Have a Medical Advance Directive? No No No  Would patient like information on creating a medical advance directive? No - Patient declined No - Patient declined No - Patient declined    Current Medications (verified) Outpatient Encounter Medications as of 06/14/2023  Medication Sig   amoxicillin -clavulanate (AUGMENTIN ) 875-125 MG tablet Take 1 tablet by mouth 2 (two) times daily.   Cholecalciferol (VITAMIN D3 PO) Take 5,000 Units by mouth daily.   levothyroxine  (SYNTHROID ) 50 MCG tablet TAKE 1 TABLET BY MOUTH IN THE  MORNING   losartan -hydrochlorothiazide  (HYZAAR) 100-25 MG tablet Take 1 tablet by mouth daily.   MAGNESIUM CITRATE PO Take by mouth.   potassium chloride  (KLOR-CON  M) 10 MEQ tablet Take 1 tablet (10 mEq total) by mouth daily.   rosuvastatin  (CRESTOR ) 10 MG tablet Take 1 tablet (10 mg total) by mouth 3 (three) times a week. Take 1 tablet three times a week   cyclobenzaprine  (FLEXERIL ) 5 MG tablet Take 1 tablet (5 mg total) by mouth at bedtime. (Patient not taking: Reported on 06/14/2023)   predniSONE  (DELTASONE ) 20 MG tablet Take 2 pills for 3 days, 1 pill for 4 days (Patient not taking: Reported on 06/14/2023)   No facility-administered encounter medications on  file as of 06/14/2023.    Allergies (verified) Patient has no known allergies.   History: Past Medical History:  Diagnosis Date   Hot flashes    Hypertension    LYMPHOPENIA 05/04/2007   saw oncology- later normalized. ? virus related   RHINITIS 12/03/2009   Past Surgical History:  Procedure Laterality Date    CARPAL TUNNEL RELEASE     right side. Dr. Aloha Arnold   CESAREAN SECTION     x2   Family History  Problem Relation Age of Onset   Breast cancer Sister    Hypertension Mother    Stroke Mother        in 53s   Heart failure Father    Hypertension Father    Hyperlipidemia Father    Social History   Socioeconomic History   Marital status: Married    Spouse name: Not on file   Number of children: Not on file   Years of education: Not on file   Highest education level: Bachelor's degree (e.g., BA, AB, BS)  Occupational History   Not on file  Tobacco Use   Smoking status: Former    Current packs/day: 0.00    Average packs/day: 1 pack/day for 15.0 years (15.0 ttl pk-yrs)    Types: Cigarettes    Start date: 05/21/1978    Quit date: 05/20/1993    Years since quitting: 30.0   Smokeless tobacco: Never  Substance and Sexual Activity   Alcohol use: Yes    Alcohol/week: 7.0 standard drinks of alcohol    Types: 7 drink(s) per week   Drug use: No   Sexual activity: Not on file  Other Topics Concern   Not on file  Social History Narrative   Married husband Richard patient of Dr. Arlene Ben. 2 children. 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      Hobbies: a lot of this time is taken up caring for her parents, does go out with friends, some entertaining, vacation once a year   Social Drivers of Corporate investment banker Strain: Low Risk  (06/14/2023)   Overall Financial Resource Strain (CARDIA)    Difficulty of Paying Living Expenses: Not hard at all  Food Insecurity: No Food Insecurity (06/14/2023)   Hunger Vital Sign    Worried About Running Out of Food in the Last Year: Never true    Ran Out of Food in the Last Year: Never true  Transportation Needs: No Transportation Needs (06/14/2023)   PRAPARE - Administrator, Civil Service (Medical): No    Lack of Transportation (Non-Medical): No  Physical Activity: Sufficiently Active  (06/14/2023)   Exercise Vital Sign    Days of Exercise per Week: 5 days    Minutes of Exercise per Session: 30 min  Stress: No Stress Concern Present (06/14/2023)   [employer] of Occupational Health - Occupational Stress Questionnaire    Feeling of Stress : Only a little  Social Connections: Moderately Integrated (06/14/2023)   Social Connection and Isolation Panel [NHANES]    Frequency of Communication with Friends and Family: More than three times a week    Frequency of Social Gatherings with Friends and Family: More than three times a week    Attends Religious Services: 1 to 4 times per year    Active Member of [employer] or Organizations: No    Attends Banker Meetings: Never    Marital  Status: Married    Tobacco Counseling Counseling given: Not Answered    Clinical Intake:  Pre-visit preparation completed: Yes  Pain : No/denies pain     BMI - recorded: 29.26 Nutritional Status: BMI 25 -29 Overweight Nutritional Risks: None Diabetes: No  No results found for: "HGBA1C"   How often do you need to have someone help you when you read instructions, pamphlets, or other written materials from your doctor or pharmacy?: 1 - Never  Interpreter Needed?: No  Information entered by :: Delpha Fickle ,CMA   Activities of Daily Living     06/14/2023    1:53 PM  In your present state of health, do you have any difficulty performing the following activities:  Hearing? 0  Vision? 0  Difficulty concentrating or making decisions? 0  Walking or climbing stairs? 0  Dressing or bathing? 0  Doing errands, shopping? 0  Preparing Food and eating ? N  Using the Toilet? N  In the past six months, have you accidently leaked urine? N  Do you have problems with loss of bowel control? N  Managing your Medications? N  Managing your Finances? N  Housekeeping or managing your Housekeeping? N    Patient Care Team: Almira Jaeger, MD as PCP - General (Family  Medicine) Hunsucker, Archer Kobs, MD as Consulting Physician (Pulmonary Disease)  I have updated your Care Teams any recent Medical Services you may have received from other providers in the past year.     Assessment:    This is a routine wellness examination for Dana Dillon.  Hearing/Vision screen Hearing Screening - Comments:: No hearing difficulties  Vision Screening - Comments:: Patient wears readers    Goals Addressed               This Visit's Progress     Patient Stated (pt-stated)        Patient would like to get back on treadmill       Depression Screen     06/14/2023    1:55 PM 07/23/2022   11:37 AM 07/21/2021   11:24 AM 07/10/2020   11:22 AM 02/20/2020    1:22 PM 09/20/2019    2:42 PM 05/12/2018    1:06 PM  PHQ 2/9 Scores  PHQ - 2 Score 2 0 0 0 4 0 2  PHQ- 9 Score 2  0  12  5    Fall Risk     06/14/2023    1:52 PM 07/23/2022   11:37 AM 05/20/2021   10:47 AM 07/10/2020   11:22 AM 02/20/2020    1:22 PM  Fall Risk   Falls in the past year? 0 0 0  0  Number falls in past yr: 0 0 0 0 0  Injury with Fall? 0 0 0 0 0  Risk for fall due to : No Fall Risks No Fall Risks No Fall Risks No Fall Risks   Follow up Falls evaluation completed Falls evaluation completed Falls evaluation completed Falls evaluation completed     MEDICARE RISK AT HOME:  Medicare Risk at Home Any stairs in or around the home?: Yes If so, are there any without handrails?: No Home free of loose throw rugs in walkways, pet beds, electrical cords, etc?: Yes Adequate lighting in your home to reduce risk of falls?: Yes Life alert?: No Use of a cane, walker or w/c?: No Grab bars in the bathroom?: Yes Shower chair or bench in shower?: Yes Elevated toilet seat or a handicapped toilet?:  Yes  TIMED UP AND GO:  Was the test performed?  No  Cognitive Function: 6CIT completed        06/14/2023    1:52 PM  6CIT Screen  What Year? 0 points  What month? 0 points  What time? 0 points  Count back from 20 0  points  Months in reverse 0 points  Repeat phrase 0 points  Total Score 0 points    Immunizations Immunization History  Administered Date(s) Administered   Fluad Quad(high Dose 65+) 10/31/2021, 11/18/2021   PFIZER Comirnaty(Gray Top)Covid-19 Tri-Sucrose Vaccine 10/31/2021   PFIZER(Purple Top)SARS-COV-2 Vaccination 04/29/2019, 05/22/2019, 11/21/2019   Respiratory Syncytial Virus Vaccine,Recomb Aduvanted(Arexvy) 10/31/2021   Tdap 07/14/2016    Screening Tests Health Maintenance  Topic Date Due   Pneumonia Vaccine 44+ Years old (1 of 1 - PCV) Never done   Zoster Vaccines- Shingrix (1 of 2) Never done   COVID-19 Vaccine (5 - 2024-25 season) 09/13/2022   MAMMOGRAM  04/30/2023   INFLUENZA VACCINE  08/13/2023   Medicare Annual Wellness (AWV)  06/13/2024   Colonoscopy  08/21/2025   DTaP/Tdap/Td (2 - Td or Tdap) 07/15/2026   DEXA SCAN  Completed   Hepatitis C Screening  Completed   HPV VACCINES  Aged Out   Meningococcal B Vaccine  Aged Out    Health Maintenance  Health Maintenance Due  Topic Date Due   Pneumonia Vaccine 8+ Years old (1 of 1 - PCV) Never done   Zoster Vaccines- Shingrix (1 of 2) Never done   COVID-19 Vaccine (5 - 2024-25 season) 09/13/2022   MAMMOGRAM  04/30/2023   Health Maintenance Items Addressed:patient declined health maintenance   Additional Screening:  Vision Screening: Recommended annual ophthalmology exams for early detection of glaucoma and other disorders of the eye. Would you like a referral to an eye doctor? No    Dental Screening: Recommended annual dental exams for proper oral hygiene  Community Resource Referral / Chronic Care Management: CRR required this visit?  No   CCM required this visit?  No   Plan:    I have personally reviewed and noted the following in the patient's chart:   Medical and social history Use of alcohol, tobacco or illicit drugs  Current medications and supplements including opioid prescriptions. Patient is  not currently taking opioid prescriptions. Functional ability and status Nutritional status Physical activity Advanced directives List of other physicians Hospitalizations, surgeries, and ER visits in previous 12 months Vitals Screenings to include cognitive, depression, and falls Referrals and appointments  In addition, I have reviewed and discussed with patient certain preventive protocols, quality metrics, and best practice recommendations. A written personalized care plan for preventive services as well as general preventive health recommendations were provided to patient.   Freeda Jerry, [place]   06/14/2023   After Visit Summary: (MyChart) Due to this being a telephonic visit, the after visit summary with patients personalized plan was offered to patient via MyChart   Notes: Nothing significant to report at this time.

## 2023-06-29 ENCOUNTER — Encounter: Payer: Self-pay | Admitting: Family Medicine

## 2023-06-29 DIAGNOSIS — Z1231 Encounter for screening mammogram for malignant neoplasm of breast: Secondary | ICD-10-CM | POA: Diagnosis not present

## 2023-06-29 DIAGNOSIS — M8588 Other specified disorders of bone density and structure, other site: Secondary | ICD-10-CM | POA: Diagnosis not present

## 2023-06-30 ENCOUNTER — Other Ambulatory Visit: Payer: Self-pay | Admitting: Family Medicine

## 2023-06-30 MED ORDER — EZETIMIBE 10 MG PO TABS: 10.0000 mg | ORAL_TABLET | Freq: Every day | ORAL | 5 refills | Status: DC

## 2023-07-15 ENCOUNTER — Other Ambulatory Visit: Payer: Self-pay | Admitting: Family Medicine

## 2023-08-04 ENCOUNTER — Encounter: Payer: Self-pay | Admitting: Family Medicine

## 2023-08-04 DIAGNOSIS — H35071 Retinal telangiectasis, right eye: Secondary | ICD-10-CM | POA: Diagnosis not present

## 2023-08-04 DIAGNOSIS — H35371 Puckering of macula, right eye: Secondary | ICD-10-CM | POA: Diagnosis not present

## 2023-08-04 DIAGNOSIS — H35021 Exudative retinopathy, right eye: Secondary | ICD-10-CM | POA: Diagnosis not present

## 2023-08-04 DIAGNOSIS — H35351 Cystoid macular degeneration, right eye: Secondary | ICD-10-CM | POA: Diagnosis not present

## 2023-08-11 ENCOUNTER — Ambulatory Visit (HOSPITAL_BASED_OUTPATIENT_CLINIC_OR_DEPARTMENT_OTHER): Admitting: Internal Medicine

## 2023-08-11 ENCOUNTER — Encounter (HOSPITAL_BASED_OUTPATIENT_CLINIC_OR_DEPARTMENT_OTHER): Payer: Self-pay | Admitting: Internal Medicine

## 2023-08-11 VITALS — BP 130/82 | HR 76 | Ht 61.5 in | Wt 160.0 lb

## 2023-08-11 DIAGNOSIS — I700 Atherosclerosis of aorta: Secondary | ICD-10-CM

## 2023-08-11 DIAGNOSIS — R931 Abnormal findings on diagnostic imaging of heart and coronary circulation: Secondary | ICD-10-CM

## 2023-08-11 DIAGNOSIS — E7841 Elevated Lipoprotein(a): Secondary | ICD-10-CM

## 2023-08-11 DIAGNOSIS — E785 Hyperlipidemia, unspecified: Secondary | ICD-10-CM | POA: Diagnosis not present

## 2023-08-11 NOTE — Patient Instructions (Addendum)
 Medication Instructions:  Your physician recommends that you continue on your current medications as directed. Please refer to the Current Medication list given to you today.  *If you need a refill on your cardiac medications before your next appointment, please call your pharmacy*  Lab Work: FASTING Lipid- NMR as soon as able If you have labs (blood work) drawn today and your tests are completely normal, you will receive your results only by: MyChart Message (if you have MyChart) OR A paper copy in the mail If you have any lab test that is abnormal or we need to change your treatment, we will call you to review the results.  Follow-Up: At [facility], you and your health needs are our priority.  As part of our continuing mission to provide you with exceptional heart care, our providers are all part of one team.  This team includes your primary Cardiologist (physician) and Advanced Practice Providers or APPs (Physician Assistants and Nurse Practitioners) who all work together to provide you with the care you need, when you need it.  Your next appointment:   TBD based on results

## 2023-08-11 NOTE — Progress Notes (Signed)
 LIPID CLINIC CONSULT NOTE  Chief Complaint:  Manage dyslipidemia  Primary Care Physician: Dana Garnette KIDD, MD  Primary Cardiologist:  None  HPI:  Dana Dillon is a 68 y.o. female who is being seen today for the evaluation of dyslipidemia at the request of Dana Garnette KIDD, MD. This is a pleasant 68 year old female kindly referred for evaluation and management of dyslipidemia.  She has a history of increased coronary calcium  with a calcium  score of 94.7, 79th percentile for age and sex matched controls along with aortic atherosclerosis.  She had been tried on simvastatin in the past but could not tolerate.  Recent lipids show total cholesterol 231, triglycerides 113, HDL 61 and LDL 147.  In addition she has an elevated LP(a) of 168 nmol/L.  Her target LDL is less than 70.  Overall diet is fairly good and she tries to avoid saturated fats.  She does have some difficulty with exercise due to multiple orthopedic issues.  PMHx:  Past Medical History:  Diagnosis Date   Hot flashes    Hypertension    LYMPHOPENIA 05/04/2007   saw oncology- later normalized. ? virus related   RHINITIS 12/03/2009    Past Surgical History:  Procedure Laterality Date   CARPAL TUNNEL RELEASE     right side. Dr. Camella   CESAREAN SECTION     x2    FAMHx:  Family History  Problem Relation Age of Onset   Breast cancer Sister    Hypertension Mother    Stroke Mother        in 71s   Heart failure Father    Hypertension Father    Hyperlipidemia Father     SOCHx:   reports that she quit smoking about 30 years ago. Her smoking use included cigarettes. She started smoking about 45 years ago. She has a 15 pack-year smoking history. She has never used smokeless tobacco. She reports current alcohol use of about 7.0 standard drinks of alcohol per week. She reports that she does not use drugs.  ALLERGIES:  No Known Allergies  ROS: Pertinent items noted in HPI and remainder of comprehensive ROS  otherwise negative.  HOME MEDS: Current Outpatient Medications on File Prior to Visit  Medication Sig Dispense Refill   amoxicillin -clavulanate (AUGMENTIN ) 875-125 MG tablet Take 1 tablet by mouth 2 (two) times daily. 20 tablet 0   Cholecalciferol (VITAMIN D3 PO) Take 5,000 Units by mouth daily.     cyclobenzaprine  (FLEXERIL ) 5 MG tablet Take 1 tablet (5 mg total) by mouth at bedtime. 30 tablet 0   ezetimibe  (ZETIA ) 10 MG tablet Take 1 tablet (10 mg total) by mouth daily. 30 tablet 5   levothyroxine  (SYNTHROID ) 50 MCG tablet TAKE 1 TABLET BY MOUTH IN THE  MORNING 100 tablet 2   losartan -hydrochlorothiazide  (HYZAAR) 100-25 MG tablet TAKE 1 TABLET BY MOUTH DAILY 100 tablet 2   MAGNESIUM CITRATE PO Take by mouth.     potassium chloride  (KLOR-CON  M) 10 MEQ tablet Take 1 tablet (10 mEq total) by mouth daily. 90 tablet 3   predniSONE  (DELTASONE ) 20 MG tablet Take 2 pills for 3 days, 1 pill for 4 days 10 tablet 0   No current facility-administered medications on file prior to visit.    LABS/IMAGING: No results found for this or any previous visit (from the past 48 hours). No results found.  LIPID PANEL:    Component Value Date/Time   CHOL 231 (H) 03/26/2023 1143   TRIG 113.0 03/26/2023 1143  HDL 61.60 03/26/2023 1143   CHOLHDL 4 03/26/2023 1143   VLDL 22.6 03/26/2023 1143   LDLCALC 147 (H) 03/26/2023 1143   LDLDIRECT 115.0 11/18/2021 1127    Lipoprotein (a)  Date/Time Value Ref Range Status  03/26/2023 11:43 AM 160 (H) <75 nmol/L Final    Comment:    . Risk Category   Optimal        < 75 nmol/L   Moderate   75 - 125 nmol/L   High          > 125 nmol/L . Cardiovascular event risk category cut points (optimal, moderate, high) are based on Tsimika S. JACC 2017;69:692-711. .      WEIGHTS: Wt Readings from Last 3 Encounters:  08/11/23 160 lb (72.6 kg)  06/14/23 160 lb (72.6 kg)  04/06/23 160 lb 9.6 oz (72.8 kg)    VITALS: BP 130/82   Pulse 76   Ht 5' 1.5 (1.562 m)    Wt 160 lb (72.6 kg)   SpO2 96%   BMI 29.74 kg/m   EXAM: Deferred  EKG: Deferred  ASSESSMENT: Dyslipidemia, goal LDL less than 70 Elevated OE(j)-831 nmol/L CAC score of 94.7, 79th percentile for age and sex matched controls Aortic atherosclerosis  PLAN: 1.   Dana Dillon has a dyslipidemia with target LDL less than 70 but she could not tolerate rosuvastatin .  She has not previously tried other statin medications and I would like to also consider a trial on atorvastatin 40 mg daily.  Will plan to update her labs including NMR prior to starting therapy and then repeat labs in about 3 months on therapy if tolerated.  Thanks again for the kind referral.  Dana KYM Maxcy, MD, [facility], FNLA, FACP  [place]  [facility]  Medical Director of the Advanced Lipid Disorders &  Cardiovascular Risk Reduction Clinic Diplomate of the American Board of Clinical Lipidology Attending Cardiologist  Direct Dial: [phone]  Fax: [phone]  Website:  [email or web address]  Dana Dillon 08/11/2023, 1:51 PM

## 2023-08-12 DIAGNOSIS — E785 Hyperlipidemia, unspecified: Secondary | ICD-10-CM | POA: Diagnosis not present

## 2023-08-13 LAB — NMR, LIPOPROFILE
Cholesterol, Total: 203 mg/dL — ABNORMAL HIGH (ref 100–199)
HDL Particle Number: 37.7 umol/L (ref >=30.5)
HDL-C: 61 mg/dL (ref >39)
LDL Particle Number: 1681 nmol/L — ABNORMAL HIGH (ref <1000)
LDL Size: 21.5 nm (ref >20.5)
LDL-C (NIH Calc): 124 mg/dL — ABNORMAL HIGH (ref 0–99)
LP-IR Score: 32 (ref <=45)
Small LDL Particle Number: 679 nmol/L — ABNORMAL HIGH (ref <=527)
Triglycerides: 99 mg/dL (ref 0–149)

## 2023-08-16 ENCOUNTER — Ambulatory Visit: Payer: Self-pay | Admitting: Internal Medicine

## 2023-08-17 ENCOUNTER — Other Ambulatory Visit: Payer: Self-pay | Admitting: *Deleted

## 2023-08-17 ENCOUNTER — Telehealth: Payer: Self-pay | Admitting: Internal Medicine

## 2023-08-17 DIAGNOSIS — E785 Hyperlipidemia, unspecified: Secondary | ICD-10-CM

## 2023-08-17 MED ORDER — ATORVASTATIN CALCIUM 40 MG PO TABS: 40.0000 mg | ORAL_TABLET | Freq: Every evening | ORAL | 1 refills | Status: DC

## 2023-08-17 NOTE — Telephone Encounter (Signed)
 Pt c/o medication issue:  1. Name of Medication: atorvastatin    2. How are you currently taking this medication (dosage and times per day)?    3. Are you having a reaction (difficulty breathing--STAT)? no  4. What is your medication issue? Patient states a prescription is suppose to be written for medication. It would need to go to [facility].  Please advise

## 2023-08-17 NOTE — Telephone Encounter (Signed)
 Per office note and lab results patient to start atorvastatin  40 mg every night.  I spoke with patient and will [facility] to [facility] [address] [facility] [address] [address].  Patient will have fasting NMR in 3 months.

## 2023-09-14 ENCOUNTER — Telehealth: Payer: Self-pay | Admitting: Family Medicine

## 2023-09-14 ENCOUNTER — Encounter: Payer: Self-pay | Admitting: Family

## 2023-09-14 ENCOUNTER — Ambulatory Visit: Payer: Self-pay

## 2023-09-14 ENCOUNTER — Ambulatory Visit (INDEPENDENT_AMBULATORY_CARE_PROVIDER_SITE_OTHER): Admitting: Family

## 2023-09-14 VITALS — BP 118/74 | HR 61 | Temp 97.5°F | Ht 61.5 in | Wt 166.4 lb

## 2023-09-14 DIAGNOSIS — R21 Rash and other nonspecific skin eruption: Secondary | ICD-10-CM

## 2023-09-14 DIAGNOSIS — L720 Epidermal cyst: Secondary | ICD-10-CM | POA: Diagnosis not present

## 2023-09-14 NOTE — Telephone Encounter (Signed)
 LVM for Patient to be Triaged for symptoms

## 2023-09-14 NOTE — Progress Notes (Signed)
 Patient ID: CORTINA VULTAGGIO, female    DOB: 23-Jan-1955, 68 y.o.   MRN: [id number]  Chief Complaint  Patient presents with   Rash    Pt c/o rash under left breast, Rash is itchy and red. SX present since Saturday. Pt has tried cortisone cream and calamine lotion, which did not help sx.   Recurrent Skin Infections    Pt c/o boil on upper back, right side. Has tried neosporin.   Discussed the use of AI scribe software for clinical note transcription with the patient, who gave verbal consent to proceed.  History of Present Illness   NUR RABOLD is a 68 year old female who presents with a rash and a recurrent boil.  Pruritic erythematous rash - Onset at 3 AM on a Saturday - Characterized by itchiness and redness, no pain - Intensity sufficient to cause awakening from sleep - Partial relief with calamine lotion - Hx of large shingles rash years ago along bilateral flanks, reports having itching & pain at that time - Recent initiation of atorvastatin  40 mg one month ago; patient questions possible association with rash  Recurrent posterior shoulder/back boil - Initial onset in mid-July  - Significant discomfort for two weeks prior to spontaneous drainage - Boil drained for approximately one week, then resolved - Minimal recurrence one week later without significant pain - Managed with Neosporin and a Band-Aid - No recurrence for the past five to six weeks until now    Assessment & Plan:     Pruritic rash under left breast Rash characterized by itching and redness, similar to shingles but w/o pain, also does not appear fungal. She received at least one dose of the shingles vaccine. - Apply OTC hydrocortisone cream twice daily (has at home). - Apply calamine lotion over hydrocortisone if needed. - Contact clinic if pain develops or rash worsens.  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 Possible recurrence due to capsule and bra strap friction. - Contact clinic at first sign of worsening for potential surgical referral. - Photograph boil for documentation and comparison.  General Health Maintenance Uncertain if second dose of shingles vaccine received. - Contact [facility] to confirm second dose administration. - Receive second dose if not administered.     Subjective:    Outpatient Medications Prior to Visit  Medication Sig Dispense Refill   atorvastatin  (LIPITOR) 40 MG tablet Take 1 tablet (40 mg total) by mouth at bedtime. 90 tablet 1   Cholecalciferol (VITAMIN D3 PO) Take 5,000 Units by mouth daily.     cyclobenzaprine  (FLEXERIL ) 5 MG tablet Take 1 tablet (5 mg total) by mouth at bedtime. 30 tablet 0   levothyroxine  (SYNTHROID ) 50 MCG tablet TAKE 1 TABLET BY MOUTH IN THE  MORNING 100 tablet 2   losartan -hydrochlorothiazide  (HYZAAR) 100-25 MG tablet TAKE 1 TABLET BY MOUTH DAILY 100 tablet 2   MAGNESIUM CITRATE PO Take by mouth.     potassium chloride  (KLOR-CON  M) 10 MEQ tablet Take 1 tablet (10 mEq total) by mouth daily. 90 tablet 3   amoxicillin -clavulanate (AUGMENTIN ) 875-125 MG tablet Take 1 tablet by mouth 2 (two) times daily. 20 tablet 0   ezetimibe  (ZETIA ) 10 MG tablet Take 1 tablet (10 mg total) by mouth daily. 30 tablet 5   predniSONE  (DELTASONE ) 20 MG tablet Take 2 pills for 3 days, 1 pill  for 4 days 10 tablet 0   No facility-administered medications prior to visit.   Past Medical History:  Diagnosis Date   Hot flashes    Hypertension    LYMPHOPENIA 05/04/2007   saw oncology- later normalized. ? virus related   RHINITIS 12/03/2009   Past Surgical History:  Procedure Laterality Date   CARPAL TUNNEL RELEASE     right side. Dr. Camella   CESAREAN SECTION     x2   No Known Allergies    Objective:    Physical Exam Vitals and nursing note reviewed.  Constitutional:      Appearance: Normal  appearance.  Cardiovascular:     Rate and Rhythm: Normal rate and regular rhythm.  Pulmonary:     Effort: Pulmonary effort is normal.     Breath sounds: Normal breath sounds.  Chest:    Musculoskeletal:        General: Normal range of motion.  Skin:    General: Skin is warm and dry.     Findings: Lesion (upper left back, approx 2cm in diameter, slightly raised, firm, lower border with dark erythema appearing as old scarring, not tender; see pic) and rash (shingles appearing rash, mild erythema, no crusting or drg noted, mild excoriation) present.  Neurological:     Mental Status: She is alert.  Psychiatric:        Mood and Affect: Mood normal.        Behavior: Behavior normal.     BP 118/74 (BP Location: Left Arm, Patient Position: Sitting, Cuff Size: Large)   Pulse 61   Temp (!) 97.5 F (36.4 C) (Temporal)   Ht 5' 1.5 (1.562 m)   Wt 166 lb 6 oz (75.5 kg)   SpO2 99%   BMI 30.93 kg/m  Wt Readings from Last 3 Encounters:  09/14/23 166 lb 6 oz (75.5 kg)  08/11/23 160 lb (72.6 kg)  06/14/23 160 lb (72.6 kg)      Lucius Krabbe, NP

## 2023-09-14 NOTE — Telephone Encounter (Signed)
 FYI Only or Action Required?: Action required by provider: request for appointment.  Patient was last seen in primary care on 04/06/2023 by Katrinka Garnette KIDD, MD.  Called Nurse Triage reporting Rash.  Symptoms began several days ago.  Interventions attempted: OTC medications: steroid patch, corticosteroid cream and calamine lotion.  Symptoms are: unchanged.  Triage Disposition: Home Care  Patient/caregiver understands and will follow disposition?: Yes      Copied from CRM [id number]. Topic: Clinical - Red Word Triage >> Sep 14, 2023 10:16 AM Winona R wrote: Pt returning office call, provider requested she be triaged for her symptoms. Bumpy Patch on upper torso, itchy all day and night, along with redness.  pt believes it shingles but states she believes it has been a little calmer Reason for Disposition  Shingles, questions about  Answer Assessment - Initial Assessment Questions Already has appt today. Pt reports will still attend appt.  1. APPEARANCE of RASH: What does the rash look like?      On Sunday patch red bumps, raised, itching, painful. This morning pale pink, raise, not as itchy; About an inch long 2. LOCATION: Where is the rash located?  Left; under breast meets torso 3. ONSET: When did the rash start?       Sunday morning 4. ITCHING: Does the rash itch? If Yes, ask: How bad is the itch?  (Scale 1-10; or mild, moderate, severe)     Mild today; Sunday severe; woke up out of my sleep 5. PAIN: Does the rash hurt? If Yes, ask: How bad is the pain?  (Scale 0-10; or none, mild, moderate, severe)     0 6. OTHER SYMPTOMS: Do you have any other symptoms? (e.g., fever)     Denies Had boil in July, same spot flamed up again; pink circle there.  Cortisone cream, calamine lotion, steroid patch. Atorvastatin ; new med started August  No itching today, no pain today.  Protocols used: Shingles (Zoster)-A-AH

## 2023-11-02 DIAGNOSIS — H35021 Exudative retinopathy, right eye: Secondary | ICD-10-CM | POA: Diagnosis not present

## 2023-11-02 DIAGNOSIS — H2513 Age-related nuclear cataract, bilateral: Secondary | ICD-10-CM | POA: Diagnosis not present

## 2023-11-02 DIAGNOSIS — H35371 Puckering of macula, right eye: Secondary | ICD-10-CM | POA: Diagnosis not present

## 2023-11-02 DIAGNOSIS — H35351 Cystoid macular degeneration, right eye: Secondary | ICD-10-CM | POA: Diagnosis not present

## 2023-11-02 DIAGNOSIS — H35071 Retinal telangiectasis, right eye: Secondary | ICD-10-CM | POA: Diagnosis not present

## 2023-11-02 LAB — OPHTHALMOLOGY REPORT-SCANNED

## 2023-11-16 ENCOUNTER — Other Ambulatory Visit: Payer: Self-pay | Admitting: Family Medicine

## 2023-11-19 LAB — NMR, LIPOPROFILE
Cholesterol, Total: 170 mg/dL (ref 100–199)
HDL Particle Number: 40.6 umol/L (ref >=30.5)
HDL-C: 59 mg/dL (ref >39)
LDL Particle Number: 1276 nmol/L — ABNORMAL HIGH (ref <1000)
LDL Size: 21.2 nm (ref >20.5)
LDL-C (NIH Calc): 95 mg/dL (ref 0–99)
LP-IR Score: 63 — ABNORMAL HIGH (ref <=45)
Small LDL Particle Number: 703 nmol/L — ABNORMAL HIGH (ref <=527)
Triglycerides: 85 mg/dL (ref 0–149)

## 2023-11-29 ENCOUNTER — Ambulatory Visit: Payer: Self-pay | Admitting: Internal Medicine

## 2023-11-29 DIAGNOSIS — E785 Hyperlipidemia, unspecified: Secondary | ICD-10-CM

## 2023-12-02 MED ORDER — EZETIMIBE 10 MG PO TABS: 10.0000 mg | ORAL_TABLET | Freq: Every day | ORAL | 3 refills | Status: AC

## 2024-02-17 ENCOUNTER — Other Ambulatory Visit: Payer: Self-pay | Admitting: Internal Medicine

## 2024-02-18 LAB — NMR, LIPOPROFILE
Cholesterol, Total: 162 mg/dL (ref 100–199)
HDL Particle Number: 50.9 umol/L
HDL-C: 68 mg/dL
LDL Particle Number: 1028 nmol/L — ABNORMAL HIGH
LDL Size: 20.5 nm — ABNORMAL LOW
LDL-C (NIH Calc): 77 mg/dL (ref 0–99)
LP-IR Score: 55 — ABNORMAL HIGH
Small LDL Particle Number: 666 nmol/L — ABNORMAL HIGH
Triglycerides: 96 mg/dL (ref 0–149)

## 2024-06-14 ENCOUNTER — Ambulatory Visit
